# Patient Record
Sex: Female | Born: 1975
Health system: Southern US, Community
[De-identification: ages and names within clinical notes are randomized; demographics above are authoritative.]

## PROBLEM LIST (undated history)

## (undated) ENCOUNTER — Emergency Department (HOSPITAL_COMMUNITY): Admission: EM | Payer: BLUE CROSS/BLUE SHIELD | Source: Home / Self Care

## (undated) DIAGNOSIS — R7989 Other specified abnormal findings of blood chemistry: Secondary | ICD-10-CM

## (undated) DIAGNOSIS — F4000 Agoraphobia, unspecified: Secondary | ICD-10-CM

## (undated) DIAGNOSIS — G8929 Other chronic pain: Secondary | ICD-10-CM

## (undated) DIAGNOSIS — C4491 Basal cell carcinoma of skin, unspecified: Secondary | ICD-10-CM

## (undated) DIAGNOSIS — N83201 Unspecified ovarian cyst, right side: Secondary | ICD-10-CM

## (undated) DIAGNOSIS — E278 Other specified disorders of adrenal gland: Secondary | ICD-10-CM

## (undated) DIAGNOSIS — K219 Gastro-esophageal reflux disease without esophagitis: Secondary | ICD-10-CM

## (undated) DIAGNOSIS — G43909 Migraine, unspecified, not intractable, without status migrainosus: Secondary | ICD-10-CM

## (undated) DIAGNOSIS — F909 Attention-deficit hyperactivity disorder, unspecified type: Secondary | ICD-10-CM

## (undated) DIAGNOSIS — F609 Personality disorder, unspecified: Secondary | ICD-10-CM

## (undated) DIAGNOSIS — N809 Endometriosis, unspecified: Secondary | ICD-10-CM

## (undated) DIAGNOSIS — F319 Bipolar disorder, unspecified: Secondary | ICD-10-CM

## (undated) DIAGNOSIS — R87629 Unspecified abnormal cytological findings in specimens from vagina: Secondary | ICD-10-CM

## (undated) HISTORY — DX: Basal cell carcinoma of skin, unspecified: C44.91

---

## 2000-02-23 ENCOUNTER — Other Ambulatory Visit: Admission: RE | Admit: 2000-02-23 | Discharge: 2000-02-23 | Payer: Self-pay | Admitting: Gynecology

## 2000-03-26 ENCOUNTER — Other Ambulatory Visit: Admission: RE | Admit: 2000-03-26 | Discharge: 2000-03-26 | Payer: Self-pay | Admitting: Gynecology

## 2000-07-02 ENCOUNTER — Encounter: Payer: Self-pay | Admitting: Internal Medicine

## 2000-07-02 ENCOUNTER — Encounter: Admission: RE | Admit: 2000-07-02 | Discharge: 2000-07-02 | Payer: Self-pay | Admitting: Internal Medicine

## 2005-07-28 ENCOUNTER — Emergency Department (HOSPITAL_COMMUNITY): Admission: EM | Admit: 2005-07-28 | Discharge: 2005-07-28 | Payer: Self-pay | Admitting: Emergency Medicine

## 2006-04-24 ENCOUNTER — Emergency Department (HOSPITAL_COMMUNITY): Admission: EM | Admit: 2006-04-24 | Discharge: 2006-04-24 | Payer: Self-pay | Admitting: Emergency Medicine

## 2006-12-14 ENCOUNTER — Emergency Department (HOSPITAL_COMMUNITY): Admission: EM | Admit: 2006-12-14 | Discharge: 2006-12-14 | Payer: Self-pay | Admitting: Emergency Medicine

## 2006-12-16 ENCOUNTER — Emergency Department (HOSPITAL_COMMUNITY): Admission: EM | Admit: 2006-12-16 | Discharge: 2006-12-16 | Payer: Self-pay | Admitting: Emergency Medicine

## 2007-05-27 ENCOUNTER — Emergency Department (HOSPITAL_COMMUNITY): Admission: EM | Admit: 2007-05-27 | Discharge: 2007-05-27 | Payer: Self-pay | Admitting: Emergency Medicine

## 2008-05-22 ENCOUNTER — Ambulatory Visit: Payer: Self-pay | Admitting: *Deleted

## 2008-05-22 ENCOUNTER — Inpatient Hospital Stay (HOSPITAL_COMMUNITY): Admission: RE | Admit: 2008-05-22 | Discharge: 2008-05-24 | Payer: Self-pay | Admitting: *Deleted

## 2009-06-20 ENCOUNTER — Emergency Department (HOSPITAL_COMMUNITY): Admission: EM | Admit: 2009-06-20 | Discharge: 2009-06-20 | Payer: Self-pay | Admitting: Emergency Medicine

## 2010-01-19 HISTORY — PX: COLONOSCOPY: SHX174

## 2010-04-29 LAB — URINALYSIS, ROUTINE W REFLEX MICROSCOPIC
Bilirubin Urine: NEGATIVE
Glucose, UA: NEGATIVE mg/dL
Hgb urine dipstick: NEGATIVE
Protein, ur: NEGATIVE mg/dL
Urobilinogen, UA: 0.2 mg/dL (ref 0.0–1.0)

## 2010-04-29 LAB — BENZODIAZEPINE, QUANTITATIVE, URINE
Alprazolam (GC/LC/MS), ur confirm: NEGATIVE
Flurazepam GC/MS Conf: NEGATIVE

## 2010-04-29 LAB — DRUGS OF ABUSE SCREEN W/O ALC, ROUTINE URINE
Amphetamine Screen, Ur: NEGATIVE
Cocaine Metabolites: NEGATIVE
Marijuana Metabolite: NEGATIVE
Methadone: NEGATIVE
Opiate Screen, Urine: NEGATIVE

## 2010-04-29 LAB — CBC
HCT: 41.1 % (ref 36.0–46.0)
Hemoglobin: 14.4 g/dL (ref 12.0–15.0)
MCHC: 35.1 g/dL (ref 30.0–36.0)
MCV: 94 fL (ref 78.0–100.0)
Platelets: 453 10*3/uL — ABNORMAL HIGH (ref 150–400)
RBC: 4.37 MIL/uL (ref 3.87–5.11)
RDW: 12.9 % (ref 11.5–15.5)
WBC: 8.2 10*3/uL (ref 4.0–10.5)

## 2010-04-29 LAB — DIFFERENTIAL
Basophils Absolute: 0 10*3/uL (ref 0.0–0.1)
Basophils Relative: 1 % (ref 0–1)
Eosinophils Absolute: 0.1 10*3/uL (ref 0.0–0.7)
Eosinophils Relative: 1 % (ref 0–5)
Lymphocytes Relative: 39 % (ref 12–46)
Lymphs Abs: 3.2 10*3/uL (ref 0.7–4.0)
Monocytes Absolute: 0.5 10*3/uL (ref 0.1–1.0)
Monocytes Relative: 6 % (ref 3–12)
Neutro Abs: 4.4 10*3/uL (ref 1.7–7.7)
Neutrophils Relative %: 53 % (ref 43–77)

## 2010-04-29 LAB — COMPREHENSIVE METABOLIC PANEL
ALT: 35 U/L (ref 0–35)
AST: 24 U/L (ref 0–37)
Albumin: 4.2 g/dL (ref 3.5–5.2)
Alkaline Phosphatase: 62 U/L (ref 39–117)
BUN: 6 mg/dL (ref 6–23)
CO2: 27 mEq/L (ref 19–32)
Calcium: 9.7 mg/dL (ref 8.4–10.5)
Chloride: 105 mEq/L (ref 96–112)
Creatinine, Ser: 0.75 mg/dL (ref 0.4–1.2)
GFR calc Af Amer: >60 mL/min (ref >60)
GFR calc non Af Amer: >60 mL/min (ref >60)
Glucose, Bld: 90 mg/dL (ref 70–99)
Potassium: 4.4 mEq/L (ref 3.5–5.1)
Sodium: 137 mEq/L (ref 135–145)
Total Bilirubin: 0.4 mg/dL (ref 0.3–1.2)
Total Protein: 7.3 g/dL (ref 6.0–8.3)

## 2010-04-29 LAB — BARBITURATE, URINE, CONFIRMATION
Amobarbital UR Quant: NEGATIVE
Butabarbital UR Quant: NEGATIVE
Butalbital UR Quant: 3100 ng/mL
Pentobarbital GC/MS Conf: NEGATIVE
Phenobarbital GC/MS Conf: NEGATIVE
Secobarbital GC/MS Conf: NEGATIVE

## 2010-06-03 NOTE — Discharge Summary (Signed)
Laura Simmons, PROPPS NO.:  [id number]   MEDICAL RECORD NO.:  [id number]          PATIENT TYPE:  IPS   LOCATION:  0301                          FACILITY:  [facility]   PHYSICIAN:  Jasmine Pang, M.D. DATE OF BIRTH:  02/09/1975   DATE OF ADMISSION:  05/22/2008  DATE OF DISCHARGE:  05/24/2008                               DISCHARGE SUMMARY   IDENTIFICATION:  This is a 35 year old single white female from [facility].  Her parents live in [place] and they brought her to  the hospital on a voluntary basis.   HISTORY OF PRESENT ILLNESS:  The patient admits to being depressed and  suicidal after her boyfriend broke up with her for someone else.  She  states she is unable to stop crying.  She admits to daily panic attacks.  She has a history of cutting on herself and feels like cutting now.  For  further admission information, see psychiatric admission assessment.   PHYSICAL FINDINGS:  There were no acute physical or medical problems  noted.   HOSPITAL COURSE:  Upon admission, the patient was restarted on her home  medications of Cymbalta 60 mg p.o. q.a.m. and Seroquel 200 mg p.o.  q.h.s. daily.  She was also started on Ativan 1 mg p.o. q.6 h. p.r.n.  anxiety.  On May 23, 2008, her Cymbalta was increased to 90 mg daily.  She was placed on Ambien 10 mg p.o. q.h.s. p.r.n. sleep and Seroquel 50  mg p.o. q.4 h. p.r.n. anxiety.  Laboratories were obtained.  These  included a UDS, which was positive for barbiturates and for  benzodiazepines.  Comprehensive metabolic panel was within normal  limits.  Urinalysis was negative.  CBC with differential was positive  for an elevated platelet count of 453 (150-400).  The patient was also  complaining of a severe migraine headache.  She was started on Midrin 2  capsules now and 1 p.o. every 2 hours.  If headache unrelieved, not to  exceed 5 pills in a 24-hour period.  In individual sessions, the patient  admitted she has been  suicidal while driving.  She denied suicidal  ideation now.  There was no recent cutting, though she has had a history  of this in the past.  She was due to see a psychiatrist on Jun 01, 2008,  but has not seen one yet.  She had been placed on Cymbalta 60 mg and  Seroquel 200 mg p.o. q.h.s. by a former psychiatrist, but she states she  was not going to return to him due to they are not clicking.  The  Cymbalta was increased to 90 mg p.o. daily.  She was also started on  Seroquel 50 mg p.o. q.4 h. p.r.n. anxiety.  On May 24, 2008, the  patient's mood was less depressed and less anxious.  Affect was  consistent with mood.  There was no suicidal or homicidal ideation.  No  thoughts of self-injurious behavior.  No auditory or visual  hallucinations.  No paranoia or delusions.  Thoughts were logical and  goal-directed.  Thought content, no predominant  theme.  Cognitive was  grossly intact.  Insight good.  Judgment good.  Impulse control good.  The patient wanted to go home.  She is planning to stay with her parents  in [place] rather than returning to [place] at this time.  Her  mother was contacted and wanted very much for the patient to be  discharged to her care.  The patient stated she gets along well with her  parents and would feel much better being at home with them and it was  felt she was safe for discharge.   DISCHARGE DIAGNOSES:  Axis I:  Major depressive episode, recurrent,  severe.  Axis II:  None.  Axis III:  Migraine headaches.  Axis IV:  Severe (problems with primary support group, recent breakup  with boyfriend, other psychosocial problems, burden of psychiatric  illness, burden of severe migraine headaches).  Axis V:  Global assessment of functioning was 50 upon discharge.  Global  assessment of functioning was 35 upon admission.  Global assessment of  functioning highest past year was 65.   DISCHARGE PLAN:  There were no specific activity level or dietary   restrictions.   POSTHOSPITAL CARE PLANS:  The patient will see Dr. Blaine Hamper in  [place] on Jun 01, 2008, at 1 o'clock p.m. for further psychiatric  treatment.   DISCHARGE MEDICATIONS:  1. Seroquel 200 mg p.o. q.h.s.  2. Cymbalta 90 mg p.o. daily.  3. Ambien 10 mg at bedtime if needed for sleep.      Jasmine Pang, M.D.  Electronically Signed     [facility]/MEDQ  D:  05/24/2008  T:  05/25/2008  Job:  [id number]

## 2012-01-25 ENCOUNTER — Encounter (HOSPITAL_BASED_OUTPATIENT_CLINIC_OR_DEPARTMENT_OTHER): Payer: Self-pay | Admitting: *Deleted

## 2012-01-25 ENCOUNTER — Emergency Department (HOSPITAL_BASED_OUTPATIENT_CLINIC_OR_DEPARTMENT_OTHER)
Admission: EM | Admit: 2012-01-25 | Discharge: 2012-01-25 | Disposition: A | Discharge status: Home / Self Care | Payer: BC Managed Care – PPO | Attending: Emergency Medicine | Admitting: Emergency Medicine

## 2012-01-25 DIAGNOSIS — E039 Hypothyroidism, unspecified: Secondary | ICD-10-CM | POA: Insufficient documentation

## 2012-01-25 DIAGNOSIS — R51 Headache: Secondary | ICD-10-CM | POA: Insufficient documentation

## 2012-01-25 DIAGNOSIS — Z8679 Personal history of other diseases of the circulatory system: Secondary | ICD-10-CM | POA: Insufficient documentation

## 2012-01-25 DIAGNOSIS — Z79899 Other long term (current) drug therapy: Secondary | ICD-10-CM | POA: Insufficient documentation

## 2012-01-25 DIAGNOSIS — F17200 Nicotine dependence, unspecified, uncomplicated: Secondary | ICD-10-CM | POA: Insufficient documentation

## 2012-01-25 HISTORY — DX: Migraine, unspecified, not intractable, without status migrainosus: G43.909

## 2012-01-25 MED ORDER — HYDROMORPHONE HCL PF 1 MG/ML IJ SOLN
1.0000 mg | Freq: Once | INTRAMUSCULAR | Status: AC
  Administered 2012-01-25: 1 mg via INTRAVENOUS
  Filled 2012-01-25: qty 1

## 2012-01-25 MED ORDER — KETOROLAC TROMETHAMINE 30 MG/ML IJ SOLN
30.0000 mg | Freq: Once | INTRAMUSCULAR | Status: AC
  Administered 2012-01-25: 30 mg via INTRAVENOUS
  Filled 2012-01-25: qty 1

## 2012-01-25 MED ORDER — SODIUM CHLORIDE 0.9 % IV SOLN: INTRAVENOUS | Status: DC

## 2012-01-25 MED ORDER — PROMETHAZINE HCL 25 MG/ML IJ SOLN
25.0000 mg | Freq: Once | INTRAMUSCULAR | Status: AC
  Administered 2012-01-25: 25 mg via INTRAVENOUS
  Filled 2012-01-25: qty 1

## 2012-01-25 MED ORDER — BUTALBITAL-APAP-CAFF-COD 50-325-40-30 MG PO CAPS: 1.0000 | ORAL_CAPSULE | ORAL | Status: DC | PRN

## 2012-01-25 MED ORDER — SODIUM CHLORIDE 0.9 % IV BOLUS (SEPSIS)
1000.0000 mL | Freq: Once | INTRAVENOUS | Status: AC
  Administered 2012-01-25: 1000 mL via INTRAVENOUS

## 2012-01-25 MED ORDER — DIPHENHYDRAMINE HCL 50 MG/ML IJ SOLN
50.0000 mg | Freq: Once | INTRAMUSCULAR | Status: AC
  Administered 2012-01-25: 50 mg via INTRAVENOUS
  Filled 2012-01-25: qty 1

## 2012-01-25 NOTE — ED Notes (Signed)
Pt reports that she is in town visiting parents and did not plan on staying this long, reports that menstruation started this am and she has developed a severe migraine this am and is out of medicine

## 2012-01-25 NOTE — ED Notes (Signed)
Pt reports that she takes phenegran and toradol injections several times per month on a regular basis

## 2012-01-25 NOTE — ED Provider Notes (Signed)
History   This chart was scribed for Laura Horn, MD by Leone Payor, ED Scribe. This patient was seen in room MHOTF/OTF and the patient's care was started at 2129.   CSN: [id number]  Arrival date & time 01/25/12  1932   First MD Initiated Contact with Patient 01/25/12 2129      Chief Complaint  Patient presents with  . Headache    The history is provided by the patient. No language interpreter was used.   Laura Simmons is a 37 y.o. female who presents to the Emergency Department complaining of gradual onset, constant, unchanged, chronic headache described as throbbing all over with the last episode starting this morning. Pt has a h/o migraines that occur 2-3 times a month. Pt denies being pregnant. Pt also has associated nausea, vomiting, photophobia. Pt states that her PCP prescribes her Fioricet and Stadol which she uses with relief, but has run out of them recently. She denies fever, confusion, abdominal pain, vaginal discharge, weakness, numbness, incoordination, trauma, trouble with vision/speech/swallowing.   Pt has h/o migraines.  Pt is a current everyday smoker but denies alcohol use.  Past Medical History  Diagnosis Date  . Migraine   . Thyroid disease     History reviewed. No pertinent past surgical history.  No family history on file.  History  Substance Use Topics  . Smoking status: Current Every Day Smoker -- 1.0 packs/day    Types: Cigarettes  . Smokeless tobacco: Not on file  . Alcohol Use: No    No OB history provided.   Review of Systems 10 Systems reviewed and all are negative for acute change except as noted in the HPI.    Allergies  Compazine  Home Medications   Current Outpatient Rx  Name  Route  Sig  Dispense  Refill  . ALPRAZOLAM 1 MG PO TABS   Oral   Take 1 mg by mouth at bedtime as needed.         Marland Kitchen FIORICET PO   Oral   Take by mouth.         . BUTORPHANOL TARTRATE 10 MG/ML NA SOLN   Nasal   Place 1 spray into the nose  every 4 (four) hours as needed.         Marland Kitchen KETOROLAC TROMETHAMINE 15 MG/ML IJ SOLN   Intravenous   Inject 15 mg into the vein once as needed.         Marland Kitchen SYNTHROID PO   Oral   Take by mouth.         Marland Kitchen TIZANIDINE HCL PO   Oral   Take by mouth.         Marland Kitchen BUTALBITAL-APAP-CAFF-COD 50-325-40-30 MG PO CAPS   Oral   Take 1 capsule by mouth every 4 (four) hours as needed for headache.   6 capsule   0     BP 112/74  Pulse 88  Temp 97.8 F (36.6 C) (Oral)  Resp 18  SpO2 100%  Physical Exam  Nursing note and vitals reviewed. Constitutional:       Awake, alert, nontoxic appearance with baseline speech for patient.  HENT:  Head: Atraumatic.  Mouth/Throat: No oropharyngeal exudate.  Eyes: EOM are normal. Pupils are equal, round, and reactive to light. Right eye exhibits no discharge. Left eye exhibits no discharge.  Neck: Neck supple.  Cardiovascular: Normal rate and regular rhythm.   No murmur heard. Pulmonary/Chest: Effort normal and breath sounds normal. No stridor. No respiratory  distress. She has no wheezes. She has no rales. She exhibits no tenderness.  Abdominal: Soft. Bowel sounds are normal. She exhibits no mass. There is no tenderness. There is no rebound.  Musculoskeletal: She exhibits no tenderness.       Baseline ROM, moves extremities with no obvious new focal weakness.  Lymphadenopathy:    She has no cervical adenopathy.  Neurological:       Awake, alert, cooperative and aware of situation; motor strength bilaterally; sensation normal to light touch bilaterally; peripheral visual fields full to confrontation; no facial asymmetry; tongue midline; major cranial nerves appear intact; no pronator drift, normal finger to nose bilaterally, baseline gait without new ataxia.  Skin: No rash noted.  Psychiatric: She has a normal mood and affect.    ED Course  Procedures (including critical care time)  DIAGNOSTIC STUDIES: Oxygen Saturation is 100% on room air,  normal by my interpretation.    COORDINATION OF CARE:  9:37PM Patient / Family / Caregiver understand and agree with initial ED impression and plan with expectations set for ED visit.   Labs Reviewed - No data to display No results found.   1. Headache       MDM  I personally performed the services described in this documentation, which was scribed in my presence. The recorded information has been reviewed and is accurate.  Patient / Family / Caregiver informed of clinical course, understand medical decision-making process, and agree with plan.  I doubt any other EMC precluding discharge at this time including, but not necessarily limited to the following: SAH, CVA, SBI.  Laura Horn, MD 01/27/12 [phone]

## 2012-01-25 NOTE — ED Notes (Signed)
Migraine headache since this am.  

## 2012-09-07 ENCOUNTER — Emergency Department (HOSPITAL_BASED_OUTPATIENT_CLINIC_OR_DEPARTMENT_OTHER)
Admission: EM | Admit: 2012-09-07 | Discharge: 2012-09-07 | Disposition: A | Discharge status: Home / Self Care | Payer: BC Managed Care – PPO | Attending: Emergency Medicine | Admitting: Emergency Medicine

## 2012-09-07 ENCOUNTER — Encounter (HOSPITAL_BASED_OUTPATIENT_CLINIC_OR_DEPARTMENT_OTHER): Payer: Self-pay | Admitting: Emergency Medicine

## 2012-09-07 DIAGNOSIS — Z79899 Other long term (current) drug therapy: Secondary | ICD-10-CM | POA: Insufficient documentation

## 2012-09-07 DIAGNOSIS — F17200 Nicotine dependence, unspecified, uncomplicated: Secondary | ICD-10-CM | POA: Insufficient documentation

## 2012-09-07 DIAGNOSIS — G43909 Migraine, unspecified, not intractable, without status migrainosus: Secondary | ICD-10-CM | POA: Insufficient documentation

## 2012-09-07 DIAGNOSIS — R112 Nausea with vomiting, unspecified: Secondary | ICD-10-CM | POA: Insufficient documentation

## 2012-09-07 DIAGNOSIS — E079 Disorder of thyroid, unspecified: Secondary | ICD-10-CM | POA: Insufficient documentation

## 2012-09-07 MED ORDER — KETOROLAC TROMETHAMINE 30 MG/ML IJ SOLN
30.0000 mg | Freq: Once | INTRAMUSCULAR | Status: AC
  Administered 2012-09-07: 30 mg via INTRAVENOUS
  Filled 2012-09-07: qty 1

## 2012-09-07 MED ORDER — DEXAMETHASONE SODIUM PHOSPHATE 10 MG/ML IJ SOLN
10.0000 mg | Freq: Once | INTRAMUSCULAR | Status: AC
  Administered 2012-09-07: 10 mg via INTRAVENOUS
  Filled 2012-09-07: qty 1

## 2012-09-07 MED ORDER — HYDROMORPHONE HCL PF 1 MG/ML IJ SOLN
1.0000 mg | Freq: Once | INTRAMUSCULAR | Status: AC
  Administered 2012-09-07: 1 mg via INTRAVENOUS
  Filled 2012-09-07: qty 1

## 2012-09-07 MED ORDER — SODIUM CHLORIDE 0.9 % IV SOLN
Freq: Once | INTRAVENOUS | Status: AC
  Administered 2012-09-07: 18:00:00 via INTRAVENOUS

## 2012-09-07 MED ORDER — PROMETHAZINE HCL 25 MG/ML IJ SOLN
25.0000 mg | Freq: Once | INTRAMUSCULAR | Status: AC
  Administered 2012-09-07: 25 mg via INTRAVENOUS
  Filled 2012-09-07: qty 1

## 2012-09-07 NOTE — ED Notes (Signed)
Pt walked in to ED. Came via [facility].  Pt states migraine started at 11:00 this morning dull pain that has progressively gotten worse.  Nausea and light sensitivity.  Originally diagnosed with migraines at age 37 years.  Pt answers all questions clearly.  Mother is in the room with pt.  Pt is sensitive to the sounds in the room.  Pt pain is a 9 out of 10.  Pt states pain is in the anterior of head.

## 2012-09-07 NOTE — ED Provider Notes (Signed)
CSN: [id number]     Arrival date & time 09/07/12  1639 History     First MD Initiated Contact with Patient 09/07/12 1706     Chief Complaint  Patient presents with  . Migraine  . Nausea   (Consider location/radiation/quality/duration/timing/severity/associated sxs/prior Treatment) Patient is a 37 y.o. female presenting with migraines. The history is provided by the patient. No language interpreter was used.  Migraine This is a recurrent problem. The current episode started today. The problem occurs constantly. The problem has been gradually improving. Associated symptoms include nausea and vomiting. Nothing aggravates the symptoms. She has tried nothing for the symptoms. The treatment provided no relief.  Pt has a history of migranes.   Pt's Md is in [place].  Pt complains of a headache since 1100am today.  No relief with usual migraine medications.    Past Medical History  Diagnosis Date  . Migraine   . Thyroid disease    History reviewed. No pertinent past surgical history. No family history on file. History  Substance Use Topics  . Smoking status: Current Every Day Smoker -- 1.00 packs/day    Types: Cigarettes  . Smokeless tobacco: Not on file  . Alcohol Use: No   OB History   Grav Para Term Preterm Abortions TAB SAB Ect Mult Living                 Review of Systems  Gastrointestinal: Positive for nausea and vomiting.  All other systems reviewed and are negative.    Allergies  Compazine and Triptans  Home Medications   Current Outpatient Rx  Name  Route  Sig  Dispense  Refill  . ALPRAZolam (XANAX) 1 MG tablet   Oral   Take 1 mg by mouth at bedtime as needed.         . butalbital-acetaminophen-caffeine (FIORICET WITH CODEINE) 50-325-40-30 MG per capsule   Oral   Take 1 capsule by mouth every 4 (four) hours as needed for headache.   6 capsule   0   . Butalbital-APAP-Caffeine (FIORICET PO)   Oral   Take by mouth.         . butorphanol (STADOL) 10  MG/ML nasal spray   Nasal   Place 1 spray into the nose every 4 (four) hours as needed.         Marland Kitchen ketorolac (TORADOL) 15 MG/ML injection   Intravenous   Inject 15 mg into the vein once as needed.         . Levothyroxine Sodium (SYNTHROID PO)   Oral   Take by mouth.         Marland Kitchen TIZANIDINE HCL PO   Oral   Take by mouth.          BP 134/86  Pulse 73  Temp(Src) 98.6 F (37 C) (Oral)  Resp 16  Ht 5\' 4"  (1.626 m)  Wt 140 lb (63.504 kg)  BMI 24.02 kg/m2  SpO2 100%  LMP 08/24/2012 Physical Exam  Nursing note and vitals reviewed. Constitutional: She is oriented to person, place, and time. She appears well-developed and well-nourished.  HENT:  Head: Normocephalic.  Right Ear: External ear normal.  Left Ear: External ear normal.  Nose: Nose normal.  Mouth/Throat: Oropharynx is clear and moist.  Eyes: Conjunctivae and EOM are normal. Pupils are equal, round, and reactive to light.  Neck: Normal range of motion. Neck supple.  Cardiovascular: Normal rate and normal heart sounds.   Pulmonary/Chest: Effort normal and breath sounds normal.  Abdominal: Soft.  Musculoskeletal: Normal range of motion.  Neurological: She is alert and oriented to person, place, and time. She has normal reflexes.  Skin: Skin is warm.  Psychiatric: She has a normal mood and affect.    ED Course   Procedures (including critical care time)  Labs Reviewed - No data to display No results found. 1. Headache     MDM  Pt given Iv fluids x 1 liter, torodol, phenergan, decadron and dilaudid.   Pt reports headache feels better  Elson Areas, PA-C 09/07/12 2004

## 2012-09-08 NOTE — ED Provider Notes (Signed)
History/physical exam/procedure(s) were performed by non-physician practitioner and as supervising physician I was immediately available for consultation/collaboration. I have reviewed all notes and am in agreement with care and plan.   Hilario Quarry, MD 09/08/12 1444

## 2013-01-06 ENCOUNTER — Ambulatory Visit (INDEPENDENT_AMBULATORY_CARE_PROVIDER_SITE_OTHER): Payer: BC Managed Care – PPO | Admitting: Physician Assistant

## 2013-01-06 VITALS — BP 132/82 | HR 84 | Temp 98.6°F | Resp 17 | Ht 65.0 in | Wt 145.0 lb

## 2013-01-06 DIAGNOSIS — R110 Nausea: Secondary | ICD-10-CM

## 2013-01-06 DIAGNOSIS — G43829 Menstrual migraine, not intractable, without status migrainosus: Secondary | ICD-10-CM

## 2013-01-06 DIAGNOSIS — G43909 Migraine, unspecified, not intractable, without status migrainosus: Secondary | ICD-10-CM

## 2013-01-06 MED ORDER — PROMETHAZINE HCL 25 MG/ML IJ SOLN
25.0000 mg | Freq: Once | INTRAMUSCULAR | Status: AC
  Administered 2013-01-06: 25 mg via INTRAMUSCULAR

## 2013-01-06 MED ORDER — KETOROLAC TROMETHAMINE 60 MG/2ML IM SOLN
60.0000 mg | Freq: Once | INTRAMUSCULAR | Status: AC
  Administered 2013-01-06: 60 mg via INTRAMUSCULAR

## 2013-01-06 NOTE — Progress Notes (Signed)
   Subjective:    Patient ID: Laura Simmons, female    DOB: 1975/09/03, 37 y.o.   MRN: [id number]  HPI Pt presents to clinic with a migraine that started today about 12pm.  She took her Fioricet but it did not seem to help.  She does not typically resort to use of her Stadol.  This is a typical migraine for her,  She starts her menses yesterday.  She has photophobia and nausea without extremity weakness.  Menstrual related and tension related migraines Works with a neurologist at home - she is here visiting her parents - when her migraines do not go away with her Fioricet she goes to her PCP and gets Nubain, Toradol, phenergan and dexamethasone injections and this stops her migraines. Review of Systems  Eyes: Positive for photophobia.  Gastrointestinal: Positive for nausea.  Neurological: Positive for headaches.       Objective:   Physical Exam  Vitals reviewed. Constitutional: She is oriented to person, place, and time. She appears well-developed and well-nourished.  Laying in a dark room.  HENT:  Head: Normocephalic and atraumatic.  Right Ear: External ear normal.  Left Ear: External ear normal.  Eyes: Conjunctivae and EOM are normal. Pupils are equal, round, and reactive to light.  Neck: Normal range of motion.  Cardiovascular: Normal rate, regular rhythm and normal heart sounds.   No murmur heard. Pulmonary/Chest: Effort normal.  Musculoskeletal: Normal range of motion.  Neurological: She is alert and oriented to person, place, and time. She has normal strength and normal reflexes. No cranial nerve deficit or sensory deficit.  Skin: Skin is warm and dry.  Psychiatric: She has a normal mood and affect. Her behavior is normal. Judgment and thought content normal.       Assessment & Plan:  Migraine - Plan: ketorolac (TORADOL) injection 60 mg, promethazine (PHENERGAN) injection 25 mg  Nausea alone  Will give her Toradol and phenergan (her mom picked her up).  Benny Lennert PA-C 01/06/2013 4:32 PM

## 2013-01-07 ENCOUNTER — Emergency Department (HOSPITAL_BASED_OUTPATIENT_CLINIC_OR_DEPARTMENT_OTHER)
Admission: EM | Admit: 2013-01-07 | Discharge: 2013-01-07 | Disposition: A | Discharge status: Home / Self Care | Payer: BC Managed Care – PPO | Attending: Emergency Medicine | Admitting: Emergency Medicine

## 2013-01-07 ENCOUNTER — Encounter (HOSPITAL_BASED_OUTPATIENT_CLINIC_OR_DEPARTMENT_OTHER): Payer: Self-pay | Admitting: Emergency Medicine

## 2013-01-07 DIAGNOSIS — Z862 Personal history of diseases of the blood and blood-forming organs and certain disorders involving the immune mechanism: Secondary | ICD-10-CM | POA: Insufficient documentation

## 2013-01-07 DIAGNOSIS — G43909 Migraine, unspecified, not intractable, without status migrainosus: Secondary | ICD-10-CM | POA: Insufficient documentation

## 2013-01-07 DIAGNOSIS — Z8639 Personal history of other endocrine, nutritional and metabolic disease: Secondary | ICD-10-CM | POA: Insufficient documentation

## 2013-01-07 DIAGNOSIS — F17200 Nicotine dependence, unspecified, uncomplicated: Secondary | ICD-10-CM | POA: Insufficient documentation

## 2013-01-07 MED ORDER — METOCLOPRAMIDE HCL 5 MG/ML IJ SOLN
10.0000 mg | Freq: Once | INTRAMUSCULAR | Status: AC
  Administered 2013-01-07: 10 mg via INTRAVENOUS
  Filled 2013-01-07: qty 2

## 2013-01-07 MED ORDER — HYDROMORPHONE HCL PF 1 MG/ML IJ SOLN
1.0000 mg | Freq: Once | INTRAMUSCULAR | Status: AC
  Administered 2013-01-07: 1 mg via INTRAVENOUS
  Filled 2013-01-07: qty 1

## 2013-01-07 MED ORDER — KETOROLAC TROMETHAMINE 30 MG/ML IJ SOLN
30.0000 mg | Freq: Once | INTRAMUSCULAR | Status: AC
  Administered 2013-01-07: 30 mg via INTRAVENOUS
  Filled 2013-01-07: qty 1

## 2013-01-07 MED ORDER — DEXAMETHASONE SODIUM PHOSPHATE 10 MG/ML IJ SOLN
10.0000 mg | Freq: Once | INTRAMUSCULAR | Status: AC
  Administered 2013-01-07: 10 mg via INTRAVENOUS
  Filled 2013-01-07: qty 1

## 2013-01-07 MED ORDER — SODIUM CHLORIDE 0.9 % IV BOLUS (SEPSIS)
1000.0000 mL | Freq: Once | INTRAVENOUS | Status: AC
  Administered 2013-01-07: 1000 mL via INTRAVENOUS

## 2013-01-07 MED ORDER — DIPHENHYDRAMINE HCL 50 MG/ML IJ SOLN
25.0000 mg | Freq: Once | INTRAMUSCULAR | Status: AC
  Administered 2013-01-07: 25 mg via INTRAVENOUS
  Filled 2013-01-07: qty 1

## 2013-01-07 NOTE — ED Provider Notes (Signed)
Medical screening examination/treatment/procedure(s) were performed by non-physician practitioner and as supervising physician I was immediately available for consultation/collaboration.  EKG Interpretation   None         William Ambrose Wile, MD 01/07/13 2348 

## 2013-01-07 NOTE — ED Notes (Signed)
P t c/o migraine that began yesterday at around noon. Pt was seen at [facility] and given toradol and phenergan injections with little relief. Pt sts they did not give her a steroid like she usually receives.

## 2013-01-07 NOTE — ED Provider Notes (Signed)
CSN: [id number]     Arrival date & time 01/07/13  2002 History   First MD Initiated Contact with Patient 01/07/13 2155     Chief Complaint  Patient presents with  . Migraine   (Consider location/radiation/quality/duration/timing/severity/associated sxs/prior Treatment) HPI Comments: Patient is a 37 year old female with a past medical history of chronic migraines associated with her menstrual cycle who presents with a headache for 3 days. Patient reports a gradual onset and progressive worsening of the headache. The pain is aching, constant and is located in generalized head without radiation. Patient has tried OTC medicatio for symptoms without relief. Patient was also seen at Urgent care and given IM toradol and phenergan without relief. No alleviating/aggravating factors. Patient reports associated nausea and photophobia. Patient denies fever, vomiting, diarrhea, numbness/tingling, weakness, visual changes, congestion, chest pain, SOB, abdominal pain.     Patient is a 37 y.o. female presenting with migraines.  Migraine Associated symptoms include headaches. Pertinent negatives include no abdominal pain, arthralgias, chest pain, chills, fatigue, fever, nausea, neck pain, vomiting or weakness.    Past Medical History  Diagnosis Date  . Migraine   . Thyroid disease    History reviewed. No pertinent past surgical history. No family history on file. History  Substance Use Topics  . Smoking status: Current Every Day Smoker -- 1.00 packs/day for 20 years    Types: Cigarettes  . Smokeless tobacco: Not on file  . Alcohol Use: No   OB History   Grav Para Term Preterm Abortions TAB SAB Ect Mult Living                 Review of Systems  Constitutional: Negative for fever, chills and fatigue.  HENT: Negative for trouble swallowing.   Eyes: Negative for visual disturbance.  Respiratory: Negative for shortness of breath.   Cardiovascular: Negative for chest pain and palpitations.   Gastrointestinal: Negative for nausea, vomiting, abdominal pain and diarrhea.  Genitourinary: Negative for dysuria and difficulty urinating.  Musculoskeletal: Negative for arthralgias and neck pain.  Skin: Negative for color change.  Neurological: Positive for headaches. Negative for dizziness and weakness.  Psychiatric/Behavioral: Negative for dysphoric mood.    Allergies  Compazine and Triptans  Home Medications   Current Outpatient Rx  Name  Route  Sig  Dispense  Refill  . ALPRAZolam (XANAX) 1 MG tablet   Oral   Take 1 mg by mouth at bedtime as needed.         . butalbital-acetaminophen-caffeine (FIORICET WITH CODEINE) 50-325-40-30 MG per capsule   Oral   Take 1 capsule by mouth every 4 (four) hours as needed for headache.   6 capsule   0   . butorphanol (STADOL) 10 MG/ML nasal spray   Nasal   Place 1 spray into the nose every 4 (four) hours as needed.         Marland Kitchen ketorolac (TORADOL) 15 MG/ML injection   Intravenous   Inject 15 mg into the vein once as needed.         Marland Kitchen TIZANIDINE HCL PO   Oral   Take by mouth.         . verapamil (COVERA HS) 240 MG (CO) 24 hr tablet   Oral   Take 240 mg by mouth at bedtime.          BP 123/84  Pulse 94  Temp(Src) 98.3 F (36.8 C) (Oral)  Resp 16  Ht 5\' 4"  (1.626 m)  Wt 145 lb (65.772 kg)  BMI 24.88 kg/m2  SpO2 100%  LMP 01/05/2013 Physical Exam  Nursing note and vitals reviewed. Constitutional: She is oriented to person, place, and time. She appears well-developed and well-nourished. No distress.  HENT:  Head: Normocephalic and atraumatic.  Eyes: Conjunctivae and EOM are normal.  Neck: Normal range of motion.  Cardiovascular: Normal rate and regular rhythm.  Exam reveals no gallop and no friction rub.   No murmur heard. Pulmonary/Chest: Effort normal and breath sounds normal. She has no wheezes. She has no rales. She exhibits no tenderness.  Abdominal: Soft. She exhibits no distension. There is no  tenderness. There is no rebound and no guarding.  Musculoskeletal: Normal range of motion.  Neurological: She is alert and oriented to person, place, and time. Coordination normal.  No meningeal signs. Speech is goal-oriented. Moves limbs without ataxia.   Skin: Skin is warm and dry.  Psychiatric: She has a normal mood and affect. Her behavior is normal.    ED Course  Procedures (including critical care time) Labs Review Labs Reviewed - No data to display Imaging Review No results found.  EKG Interpretation   None       MDM   1. Migraine     11:25 PM Patient is feeling much better after migraine cocktail of fluids, toradol, reglan, dilaudid and benadryl. Vitals stable and patient afebrile. Patient reports this felt like one of her typical migraine headaches associated with her menstrual cycle. Patient will be discharged without further evaluation.     Emilia Beck, PA-C 01/07/13 2332

## 2013-01-19 ENCOUNTER — Emergency Department (HOSPITAL_BASED_OUTPATIENT_CLINIC_OR_DEPARTMENT_OTHER)
Admission: EM | Admit: 2013-01-19 | Discharge: 2013-01-19 | Disposition: A | Discharge status: Home / Self Care | Payer: BC Managed Care – PPO | Attending: Emergency Medicine | Admitting: Emergency Medicine

## 2013-01-19 ENCOUNTER — Emergency Department (HOSPITAL_BASED_OUTPATIENT_CLINIC_OR_DEPARTMENT_OTHER): Payer: BC Managed Care – PPO

## 2013-01-19 DIAGNOSIS — Z862 Personal history of diseases of the blood and blood-forming organs and certain disorders involving the immune mechanism: Secondary | ICD-10-CM | POA: Insufficient documentation

## 2013-01-19 DIAGNOSIS — N390 Urinary tract infection, site not specified: Secondary | ICD-10-CM | POA: Insufficient documentation

## 2013-01-19 DIAGNOSIS — Z79899 Other long term (current) drug therapy: Secondary | ICD-10-CM | POA: Insufficient documentation

## 2013-01-19 DIAGNOSIS — F17200 Nicotine dependence, unspecified, uncomplicated: Secondary | ICD-10-CM | POA: Insufficient documentation

## 2013-01-19 DIAGNOSIS — Z8639 Personal history of other endocrine, nutritional and metabolic disease: Secondary | ICD-10-CM | POA: Insufficient documentation

## 2013-01-19 DIAGNOSIS — R1110 Vomiting, unspecified: Secondary | ICD-10-CM | POA: Insufficient documentation

## 2013-01-19 DIAGNOSIS — G43909 Migraine, unspecified, not intractable, without status migrainosus: Secondary | ICD-10-CM | POA: Insufficient documentation

## 2013-01-19 DIAGNOSIS — Z3202 Encounter for pregnancy test, result negative: Secondary | ICD-10-CM | POA: Insufficient documentation

## 2013-01-19 LAB — URINALYSIS, ROUTINE W REFLEX MICROSCOPIC
Glucose, UA: NEGATIVE mg/dL
Hgb urine dipstick: NEGATIVE
Ketones, ur: NEGATIVE mg/dL
NITRITE: POSITIVE — AB
Protein, ur: NEGATIVE mg/dL
SPECIFIC GRAVITY, URINE: 1.014 (ref 1.005–1.030)
UROBILINOGEN UA: 1 mg/dL (ref 0.0–1.0)
pH: 6 (ref 5.0–8.0)

## 2013-01-19 LAB — URINE MICROSCOPIC-ADD ON

## 2013-01-19 LAB — PREGNANCY, URINE: Preg Test, Ur: NEGATIVE

## 2013-01-19 MED ORDER — CEPHALEXIN 500 MG PO CAPS: 500.0000 mg | ORAL_CAPSULE | Freq: Four times a day (QID) | ORAL | Status: DC

## 2013-01-19 MED ORDER — PANTOPRAZOLE SODIUM 20 MG PO TBEC: 20.0000 mg | DELAYED_RELEASE_TABLET | Freq: Every day | ORAL | Status: DC

## 2013-01-19 MED ORDER — ONDANSETRON 4 MG PO TBDP
4.0000 mg | ORAL_TABLET | Freq: Once | ORAL | Status: AC
  Administered 2013-01-19: 4 mg via ORAL
  Filled 2013-01-19: qty 1

## 2013-01-19 MED ORDER — PHENAZOPYRIDINE HCL 200 MG PO TABS: 200.0000 mg | ORAL_TABLET | Freq: Three times a day (TID) | ORAL | Status: DC

## 2013-01-19 MED ORDER — GI COCKTAIL ~~LOC~~
30.0000 mL | Freq: Once | ORAL | Status: AC
  Administered 2013-01-19: 30 mL via ORAL
  Filled 2013-01-19: qty 30

## 2013-01-19 MED ORDER — HYDROCODONE-ACETAMINOPHEN 5-325 MG PO TABS
2.0000 | ORAL_TABLET | Freq: Once | ORAL | Status: AC
Start: 2013-01-19 — End: 2013-01-19
  Administered 2013-01-19: 2 via ORAL
  Filled 2013-01-19: qty 2

## 2013-01-19 MED ORDER — HYDROCODONE-ACETAMINOPHEN 5-325 MG PO TABS: 2.0000 | ORAL_TABLET | ORAL | Status: DC | PRN

## 2013-01-19 NOTE — ED Provider Notes (Signed)
CSN: [id number]     Arrival date & time 01/19/13  1352 History   First MD Initiated Contact with Patient 01/19/13 1532     Chief Complaint  Patient presents with  . Chest Pain   (Consider location/radiation/quality/duration/timing/severity/associated sxs/prior Treatment) Patient is a 38 y.o. female presenting with frequency. The history is provided by the patient. No language interpreter was used.  Urinary Frequency This is a new problem. The current episode started 1 to 4 weeks ago. The problem occurs constantly. The problem has been gradually worsening. Associated symptoms include vomiting. Pertinent negatives include no abdominal pain. Nothing aggravates the symptoms. She has tried nothing for the symptoms.  Pt also complains of epigastric discomfort after eating beef and onions cooked in sesame oil.    Past Medical History  Diagnosis Date  . Migraine   . Thyroid disease    No past surgical history on file. No family history on file. History  Substance Use Topics  . Smoking status: Current Every Day Smoker -- 1.00 packs/day for 20 years    Types: Cigarettes  . Smokeless tobacco: Not on file  . Alcohol Use: No   OB History   Grav Para Term Preterm Abortions TAB SAB Ect Mult Living                 Review of Systems  Gastrointestinal: Positive for vomiting. Negative for abdominal pain.  Genitourinary: Positive for frequency.  All other systems reviewed and are negative.    Allergies  Compazine and Triptans  Home Medications   Current Outpatient Rx  Name  Route  Sig  Dispense  Refill  . ALPRAZolam (XANAX) 1 MG tablet   Oral   Take 1 mg by mouth at bedtime as needed.         . butalbital-acetaminophen-caffeine (FIORICET WITH CODEINE) 50-325-40-30 MG per capsule   Oral   Take 1 capsule by mouth every 4 (four) hours as needed for headache.   6 capsule   0   . butorphanol (STADOL) 10 MG/ML nasal spray   Nasal   Place 1 spray into the nose every 4 (four) hours as  needed.         Marland Kitchen ketorolac (TORADOL) 15 MG/ML injection   Intravenous   Inject 15 mg into the vein once as needed.         Marland Kitchen TIZANIDINE HCL PO   Oral   Take by mouth.         . verapamil (COVERA HS) 240 MG (CO) 24 hr tablet   Oral   Take 240 mg by mouth at bedtime.          BP 122/85  Pulse 75  Temp(Src) 98.7 F (37.1 C) (Oral)  Resp 18  Ht 5\' 4"  (1.626 m)  Wt 145 lb (65.772 kg)  BMI 24.88 kg/m2  SpO2 97%  LMP 01/05/2013 Physical Exam  Nursing note and vitals reviewed. Constitutional: She is oriented to person, place, and time. She appears well-developed and well-nourished.  HENT:  Head: Normocephalic.  Eyes: Conjunctivae and EOM are normal. Pupils are equal, round, and reactive to light.  Neck: Normal range of motion.  Cardiovascular: Normal rate, regular rhythm and normal heart sounds.   Pulmonary/Chest: Effort normal.  Abdominal: She exhibits no distension.  Musculoskeletal: Normal range of motion.  Neurological: She is alert and oriented to person, place, and time.  Skin: Skin is warm.  Psychiatric: She has a normal mood and affect.    ED Course  Procedures (including critical care time) Labs Review Labs Reviewed  URINALYSIS, ROUTINE W REFLEX MICROSCOPIC - Abnormal; Notable for the following:    Color, Urine ORANGE (*)    APPearance CLOUDY (*)    Bilirubin Urine SMALL (*)    Nitrite POSITIVE (*)    Leukocytes, UA SMALL (*)    All other components within normal limits  URINE MICROSCOPIC-ADD ON - Abnormal; Notable for the following:    Squamous Epithelial / LPF MANY (*)    Bacteria, UA MANY (*)    All other components within normal limits  URINE CULTURE  PREGNANCY, URINE   Imaging Review Dg Chest 2 View  01/19/2013   CLINICAL DATA:  Chest pain.  EXAM: CHEST  2 VIEW  COMPARISON:  None.  FINDINGS: The cardiac silhouette, mediastinal and hilar contours are within normal limits. The lungs are clear. No pleural effusion. The bony thorax is intact.   IMPRESSION: No acute cardiopulmonary findings.   Electronically Signed   By: Kalman Jewels M.D.   On: 01/19/2013 14:49    EKG Interpretation   None      No relief with gi cocktail,  Pt counseled on treatment MDM   1. UTI (lower urinary tract infection)    Hydrocodone, keflex, pyridium and protonix    Fransico Meadow, PA-C 01/19/13 1844

## 2013-01-19 NOTE — ED Notes (Addendum)
Pt sts constant pain in R sternal/rib joint area, which worsens after eating. Upon palpation, pain is reproduced over R rib. Vomiting today. Denies shob but sts it hurts to take a deep breath. Pt also reports pain in lower abd and lower back, sts she has been taking AZO for suspected UTI. Also sts light brown diarrhea x1 yesterday and loose light brown stool today.

## 2013-01-19 NOTE — ED Provider Notes (Addendum)
Medical screening examination/treatment/procedure(s) were performed by non-physician practitioner and as supervising physician I was immediately available for consultation/collaboration.  EKG Interpretation    Date/Time:  Thursday January 19 2013 14:14:11 EST Ventricular Rate:  81 PR Interval:  142 QRS Duration: 70 QT Interval:  380 QTC Calculation: 441 R Axis:   75 Text Interpretation:  Normal sinus rhythm Normal ECG No old tracing to compare Confirmed by Chanay Nugent  MD, Laurene Melendrez ([id number]) on 01/19/2013 8:56:20 PM              Malvin Johns, MD 01/19/13 [place], MD 01/19/13 2056

## 2013-01-20 LAB — URINE CULTURE

## 2013-07-11 ENCOUNTER — Encounter (HOSPITAL_BASED_OUTPATIENT_CLINIC_OR_DEPARTMENT_OTHER): Payer: Self-pay | Admitting: Emergency Medicine

## 2013-07-11 ENCOUNTER — Emergency Department (HOSPITAL_BASED_OUTPATIENT_CLINIC_OR_DEPARTMENT_OTHER)
Admission: EM | Admit: 2013-07-11 | Discharge: 2013-07-11 | Disposition: A | Discharge status: Home / Self Care | Payer: BC Managed Care – PPO | Attending: Emergency Medicine | Admitting: Emergency Medicine

## 2013-07-11 DIAGNOSIS — R519 Headache, unspecified: Secondary | ICD-10-CM

## 2013-07-11 DIAGNOSIS — Z79899 Other long term (current) drug therapy: Secondary | ICD-10-CM | POA: Insufficient documentation

## 2013-07-11 DIAGNOSIS — Z792 Long term (current) use of antibiotics: Secondary | ICD-10-CM | POA: Insufficient documentation

## 2013-07-11 DIAGNOSIS — G43909 Migraine, unspecified, not intractable, without status migrainosus: Secondary | ICD-10-CM | POA: Insufficient documentation

## 2013-07-11 DIAGNOSIS — Z862 Personal history of diseases of the blood and blood-forming organs and certain disorders involving the immune mechanism: Secondary | ICD-10-CM | POA: Insufficient documentation

## 2013-07-11 DIAGNOSIS — R51 Headache: Secondary | ICD-10-CM

## 2013-07-11 DIAGNOSIS — Z8639 Personal history of other endocrine, nutritional and metabolic disease: Secondary | ICD-10-CM | POA: Insufficient documentation

## 2013-07-11 DIAGNOSIS — F17200 Nicotine dependence, unspecified, uncomplicated: Secondary | ICD-10-CM | POA: Insufficient documentation

## 2013-07-11 MED ORDER — SODIUM CHLORIDE 0.9 % IV BOLUS (SEPSIS)
1000.0000 mL | Freq: Once | INTRAVENOUS | Status: AC
  Administered 2013-07-11: 1000 mL via INTRAVENOUS

## 2013-07-11 MED ORDER — METOCLOPRAMIDE HCL 5 MG/ML IJ SOLN
10.0000 mg | Freq: Once | INTRAMUSCULAR | Status: AC
  Administered 2013-07-11: 10 mg via INTRAVENOUS

## 2013-07-11 MED ORDER — DIPHENHYDRAMINE HCL 50 MG/ML IJ SOLN
25.0000 mg | Freq: Once | INTRAMUSCULAR | Status: AC
  Administered 2013-07-11: 25 mg via INTRAVENOUS

## 2013-07-11 MED ORDER — BUTORPHANOL TARTRATE 10 MG/ML NA SOLN: 1.0000 | NASAL | Status: DC | PRN

## 2013-07-11 MED ORDER — HYDROMORPHONE HCL PF 1 MG/ML IJ SOLN
0.5000 mg | Freq: Once | INTRAMUSCULAR | Status: AC
Start: 2013-07-11 — End: 2013-07-11
  Administered 2013-07-11: 0.5 mg via INTRAVENOUS
  Filled 2013-07-11: qty 1

## 2013-07-11 MED ORDER — KETOROLAC TROMETHAMINE 30 MG/ML IJ SOLN
30.0000 mg | Freq: Once | INTRAMUSCULAR | Status: AC
  Administered 2013-07-11: 30 mg via INTRAVENOUS

## 2013-07-11 MED ORDER — DEXAMETHASONE SODIUM PHOSPHATE 10 MG/ML IJ SOLN
10.0000 mg | Freq: Once | INTRAMUSCULAR | Status: AC
  Administered 2013-07-11: 10 mg via INTRAVENOUS

## 2013-07-11 NOTE — ED Notes (Signed)
Pt. Reports she is from [facility] and did not pack her stadol before coming to visit her mother.  Pt. Reports she was awakened by the headache. Pt. Reports pain and pressure on R side and in the neck.

## 2013-07-11 NOTE — ED Provider Notes (Signed)
CSN: [id number]     Arrival date & time 07/11/13  1704 History   First MD Initiated Contact with Patient 07/11/13 1713     Chief Complaint  Patient presents with  . Migraine      HPI  Patient presents with concerns of headache.  This headache awakened her from sleep approximately 12 hours ago. Since onset and is been frontal, throbbing, severe. There is some radiation towards the left neck. This is typical for her recurrent headache exacerbations. Patient has a long history of migraines, denies new characteristics today. Patient does not have her medication with her. No new confusion, disorientation, though the patient is nauseous No new visual changes, asymmetric weakness, syncope.  P .ast Medical History  Diagnosis Date  . Migraine   . Thyroid disease    History reviewed. No pertinent past surgical history. No family history on file. History  Substance Use Topics  . Smoking status: Current Every Day Smoker -- 1.00 packs/day for 20 years    Types: Cigarettes  . Smokeless tobacco: Not on file  . Alcohol Use: No   OB History   Grav Para Term Preterm Abortions TAB SAB Ect Mult Living                 Review of Systems  Constitutional:       Per HPI, otherwise negative  HENT:       Per HPI, otherwise negative  Respiratory:       Per HPI, otherwise negative  Cardiovascular:       Per HPI, otherwise negative  Gastrointestinal: Negative for vomiting.  Endocrine:       Negative aside from HPI  Genitourinary:       Neg aside from HPI   Musculoskeletal:       Per HPI, otherwise negative  Skin: Negative.   Neurological: Positive for headaches. Negative for syncope.      Allergies  Compazine and Triptans  Home Medications   Prior to Admission medications   Medication Sig Start Date End Date Taking? Authorizing Provider  ALPRAZolam Duanne Moron) 1 MG tablet Take 1 mg by mouth at bedtime as needed.    Historical Provider, MD  butalbital-acetaminophen-caffeine  (FIORICET WITH CODEINE) 50-325-40-30 MG per capsule Take 1 capsule by mouth every 4 (four) hours as needed for headache. 01/25/12   Babette Relic, MD  butorphanol (STADOL) 10 MG/ML nasal spray Place 1 spray into the nose every 4 (four) hours as needed.    Historical Provider, MD  cephALEXin (KEFLEX) 500 MG capsule Take 1 capsule (500 mg total) by mouth 4 (four) times daily. 01/19/13   Fransico Meadow, PA-C  HYDROcodone-acetaminophen (NORCO/VICODIN) 5-325 MG per tablet Take 2 tablets by mouth every 4 (four) hours as needed. 01/19/13   Fransico Meadow, PA-C  ketorolac (TORADOL) 15 MG/ML injection Inject 15 mg into the vein once as needed.    Historical Provider, MD  pantoprazole (PROTONIX) 20 MG tablet Take 1 tablet (20 mg total) by mouth daily. 01/19/13   Fransico Meadow, PA-C  phenazopyridine (PYRIDIUM) 200 MG tablet Take 1 tablet (200 mg total) by mouth 3 (three) times daily. 01/19/13   Fransico Meadow, PA-C  TIZANIDINE HCL PO Take by mouth.    Historical Provider, MD  verapamil (COVERA HS) 240 MG (CO) 24 hr tablet Take 240 mg by mouth at bedtime.    Historical Provider, MD   LMP 06/26/2013 Physical Exam  Nursing note and vitals reviewed. Constitutional: She is oriented  to person, place, and time. She appears well-developed and well-nourished. No distress.  HENT:  Head: Normocephalic and atraumatic.  Eyes: Conjunctivae and EOM are normal.  Cardiovascular: Normal rate and regular rhythm.   Pulmonary/Chest: Effort normal and breath sounds normal. No stridor. No respiratory distress.  Abdominal: She exhibits no distension.  Musculoskeletal: She exhibits no edema.  Neurological: She is alert and oriented to person, place, and time. No cranial nerve deficit. She exhibits normal muscle tone. Coordination normal.  Skin: Skin is warm and dry.  Psychiatric: She has a normal mood and affect.    ED Course  Procedures (including critical care time) After the evaluation I reviewed the patient's chart.  10:03  PM Pain substantially improved, no new complaints, no evidence of distress.  MDM  Patient with a long history of migraines presents with typical headache.  Patient has no new neurologic complaints, deficits on exam, and after resolution of her headache, she was discharged in stable condition.   Carmin Muskrat, MD 07/11/13 2203

## 2013-07-11 NOTE — Discharge Instructions (Signed)
As discussed, your evaluation today has been largely reassuring.  But, it is important that you monitor your condition carefully, and do not hesitate to return to the ED if you develop new, or concerning changes in your condition. ? ?Otherwise, please follow-up with your physician for appropriate ongoing care. ? ?

## 2013-09-18 ENCOUNTER — Emergency Department (HOSPITAL_BASED_OUTPATIENT_CLINIC_OR_DEPARTMENT_OTHER)
Admission: EM | Admit: 2013-09-18 | Discharge: 2013-09-18 | Disposition: A | Discharge status: Home / Self Care | Payer: BC Managed Care – PPO | Attending: Emergency Medicine | Admitting: Emergency Medicine

## 2013-09-18 ENCOUNTER — Encounter (HOSPITAL_BASED_OUTPATIENT_CLINIC_OR_DEPARTMENT_OTHER): Payer: Self-pay | Admitting: Emergency Medicine

## 2013-09-18 DIAGNOSIS — R110 Nausea: Secondary | ICD-10-CM | POA: Diagnosis not present

## 2013-09-18 DIAGNOSIS — Z79899 Other long term (current) drug therapy: Secondary | ICD-10-CM | POA: Insufficient documentation

## 2013-09-18 DIAGNOSIS — Z792 Long term (current) use of antibiotics: Secondary | ICD-10-CM | POA: Diagnosis not present

## 2013-09-18 DIAGNOSIS — G43909 Migraine, unspecified, not intractable, without status migrainosus: Secondary | ICD-10-CM | POA: Diagnosis present

## 2013-09-18 DIAGNOSIS — F17200 Nicotine dependence, unspecified, uncomplicated: Secondary | ICD-10-CM | POA: Insufficient documentation

## 2013-09-18 DIAGNOSIS — G43001 Migraine without aura, not intractable, with status migrainosus: Secondary | ICD-10-CM | POA: Insufficient documentation

## 2013-09-18 DIAGNOSIS — H53149 Visual discomfort, unspecified: Secondary | ICD-10-CM | POA: Diagnosis not present

## 2013-09-18 MED ORDER — KETAMINE HCL 10 MG/ML IJ SOLN
1.0000 mg/kg | Freq: Once | INTRAMUSCULAR | Status: AC
  Administered 2013-09-18: 66 mg via INTRAVENOUS
  Filled 2013-09-18: qty 1

## 2013-09-18 MED ORDER — DEXAMETHASONE SODIUM PHOSPHATE 4 MG/ML IJ SOLN
INTRAMUSCULAR | Status: AC
  Filled 2013-09-18: qty 1

## 2013-09-18 MED ORDER — DEXAMETHASONE SODIUM PHOSPHATE 20 MG/5ML IJ SOLN
INTRAMUSCULAR | Status: AC
  Administered 2013-09-18: 10 mg
  Filled 2013-09-18: qty 5

## 2013-09-18 MED ORDER — KETOROLAC TROMETHAMINE 60 MG/2ML IM SOLN
60.0000 mg | Freq: Once | INTRAMUSCULAR | Status: AC
  Administered 2013-09-18: 60 mg via INTRAMUSCULAR
  Filled 2013-09-18: qty 2

## 2013-09-18 MED ORDER — DIPHENHYDRAMINE HCL 25 MG PO CAPS
25.0000 mg | ORAL_CAPSULE | Freq: Once | ORAL | Status: AC
  Administered 2013-09-18: 25 mg via ORAL
  Filled 2013-09-18: qty 1

## 2013-09-18 MED ORDER — SODIUM CHLORIDE 0.9 % IV BOLUS (SEPSIS)
1000.0000 mL | Freq: Once | INTRAVENOUS | Status: AC
  Administered 2013-09-18: 500 mL via INTRAVENOUS

## 2013-09-18 MED ORDER — METOCLOPRAMIDE HCL 5 MG/ML IJ SOLN
10.0000 mg | Freq: Once | INTRAMUSCULAR | Status: AC
  Administered 2013-09-18: 10 mg via INTRAMUSCULAR
  Filled 2013-09-18: qty 2

## 2013-09-18 MED ORDER — DEXAMETHASONE SODIUM PHOSPHATE 10 MG/ML IJ SOLN
10.0000 mg | Freq: Once | INTRAMUSCULAR | Status: AC
  Administered 2013-09-18: 10 mg via INTRAMUSCULAR

## 2013-09-18 NOTE — ED Provider Notes (Signed)
CSN: [id number]     Arrival date & time 09/18/13  1323 History   First MD Initiated Contact with Patient 09/18/13 1343     Chief Complaint  Patient presents with  . Migraine     (Consider location/radiation/quality/duration/timing/severity/associated sxs/prior Treatment) HPI Comments: Patient is a 38 year old female with a past medical history of migraines and thyroid disease who presents to the emergency department complaining of a migraine headache x3 days. Patient reports she tends to get migraine headaches around the onset of her menses, she reports she had a headache one week ago that lasted about 3 days. Her headache has subsided and returned gradually. Headache located at the top of her head radiating throughout, described as throbbing. Admits to photophobia, phonophobia and nausea. No vomiting. She has tried taking Imitrex, benadryl and ibuprofen with minimal relief. States this feels the same as her typical migraines. Denies fever chills. No neck stiffness.  Patient is a 38 y.o. female presenting with migraines. The history is provided by the patient.  Migraine Associated symptoms include headaches and nausea.    Past Medical History  Diagnosis Date  . Migraine   . Thyroid disease    History reviewed. No pertinent past surgical history. No family history on file. History  Substance Use Topics  . Smoking status: Current Every Day Smoker -- 1.00 packs/day for 20 years    Types: Cigarettes  . Smokeless tobacco: Not on file  . Alcohol Use: No   OB History   Grav Para Term Preterm Abortions TAB SAB Ect Mult Living                 Review of Systems  Eyes: Positive for photophobia.  Gastrointestinal: Positive for nausea.  Neurological: Positive for headaches.  All other systems reviewed and are negative.     Allergies  Compazine and Triptans  Home Medications   Prior to Admission medications   Medication Sig Start Date End Date Taking? Authorizing Provider   ALPRAZolam Duanne Moron) 1 MG tablet Take 1 mg by mouth at bedtime as needed.    Historical Provider, MD  butalbital-acetaminophen-caffeine (FIORICET WITH CODEINE) 50-325-40-30 MG per capsule Take 1 capsule by mouth every 4 (four) hours as needed for headache. 01/25/12   Babette Relic, MD  butorphanol (STADOL) 10 MG/ML nasal spray Place 1 spray into the nose every 4 (four) hours as needed. 07/11/13   Carmin Muskrat, MD  cephALEXin (KEFLEX) 500 MG capsule Take 1 capsule (500 mg total) by mouth 4 (four) times daily. 01/19/13   Fransico Meadow, PA-C  HYDROcodone-acetaminophen (NORCO/VICODIN) 5-325 MG per tablet Take 2 tablets by mouth every 4 (four) hours as needed. 01/19/13   Fransico Meadow, PA-C  ketorolac (TORADOL) 15 MG/ML injection Inject 15 mg into the vein once as needed.    Historical Provider, MD  pantoprazole (PROTONIX) 20 MG tablet Take 1 tablet (20 mg total) by mouth daily. 01/19/13   Fransico Meadow, PA-C  phenazopyridine (PYRIDIUM) 200 MG tablet Take 1 tablet (200 mg total) by mouth 3 (three) times daily. 01/19/13   Fransico Meadow, PA-C  TIZANIDINE HCL PO Take by mouth.    Historical Provider, MD  verapamil (COVERA HS) 240 MG (CO) 24 hr tablet Take 240 mg by mouth at bedtime.    Historical Provider, MD   BP 141/92  Pulse 86  Temp(Src) 98 F (36.7 C) (Oral)  Resp 20  Wt 145 lb (65.772 kg)  SpO2 100%  LMP 09/17/2013 Physical Exam  Nursing  note and vitals reviewed. Constitutional: She is oriented to person, place, and time. She appears well-developed and well-nourished. No distress.  Sitting on exam bed with lights off and sunglasses on.  HENT:  Head: Normocephalic and atraumatic.  Mouth/Throat: Oropharynx is clear and moist.  Eyes: Conjunctivae and EOM are normal. Pupils are equal, round, and reactive to light.  Neck: Normal range of motion. Neck supple.  No meningeal signs.  Cardiovascular: Normal rate, regular rhythm, normal heart sounds and intact distal pulses.   Pulmonary/Chest: Effort  normal and breath sounds normal. No respiratory distress.  Abdominal: Soft. Bowel sounds are normal. There is no tenderness.  Musculoskeletal: Normal range of motion. She exhibits no edema.  Neurological: She is alert and oriented to person, place, and time. She has normal strength. No cranial nerve deficit or sensory deficit. Coordination and gait normal.  Speech fluent, goal oriented. Moves limbs without ataxia. Equal grip strength bilateral.  Skin: Skin is warm and dry. No rash noted. She is not diaphoretic.  Psychiatric: She has a normal mood and affect. Her behavior is normal.    ED Course  Procedures (including critical care time) Labs Review Labs Reviewed - No data to display  Imaging Review No results found.   EKG Interpretation None      MDM   Final diagnoses:  Migraine without aura and with status migrainosus, not intractable   Patient is having a migraine headache, the same as her typical migraines. She is nontoxic appearing and in no apparent distress. Afebrile, vital signs stable. No focal neurologic deficits. No meningeal signs. Doubt intracranial hemorrhage, subarachnoid hemorrhage or meningitis. Plan to give migraine cocktail and reassess. 3:23 PM Pt reports no improvement with reglan, benadryl, toradol and decadron. She is requesting dilaudid or Nubain. I discussed with her that these are not appropriate treatments for a migraine headache. Will give pt ketamine per Dr. Darl Householder who will push the Ketamine.  Pt signed out to Charlann Lange, PA-C at shift change. Plan to d/c patient after she gets Ketamine.  Illene Labrador, PA-C 09/18/13 1550

## 2013-09-18 NOTE — ED Provider Notes (Signed)
Medical screening examination/treatment/procedure(s) were conducted as a shared visit with non-physician practitioner(s) and myself.  I personally evaluated the patient during the encounter.   EKG Interpretation None      See my separate note   Wandra Arthurs, MD 09/18/13 [phone]

## 2013-09-18 NOTE — Discharge Instructions (Signed)
Migraine Headache A migraine headache is an intense, throbbing pain on one or both sides of your head. A migraine can last for 30 minutes to several hours. CAUSES  The exact cause of a migraine headache is not always known. However, a migraine may be caused when nerves in the brain become irritated and release chemicals that cause inflammation. This causes pain. Certain things may also trigger migraines, such as:  Alcohol.  Smoking.  Stress.  Menstruation.  Aged cheeses.  Foods or drinks that contain nitrates, glutamate, aspartame, or tyramine.  Lack of sleep.  Chocolate.  Caffeine.  Hunger.  Physical exertion.  Fatigue.  Medicines used to treat chest pain (nitroglycerine), birth control pills, estrogen, and some blood pressure medicines. SIGNS AND SYMPTOMS  Pain on one or both sides of your head.  Pulsating or throbbing pain.  Severe pain that prevents daily activities.  Pain that is aggravated by any physical activity.  Nausea, vomiting, or both.  Dizziness.  Pain with exposure to bright lights, loud noises, or activity.  General sensitivity to bright lights, loud noises, or smells. Before you get a migraine, you may get warning signs that a migraine is coming (aura). An aura may include:  Seeing flashing lights.  Seeing bright spots, halos, or zigzag lines.  Having tunnel vision or blurred vision.  Having feelings of numbness or tingling.  Having trouble talking.  Having muscle weakness. DIAGNOSIS  A migraine headache is often diagnosed based on:  Symptoms.  Physical exam.  A CT scan or MRI of your head. These imaging tests cannot diagnose migraines, but they can help rule out other causes of headaches. TREATMENT Medicines may be given for pain and nausea. Medicines can also be given to help prevent recurrent migraines.  HOME CARE INSTRUCTIONS  Only take over-the-counter or prescription medicines for pain or discomfort as directed by your  health care provider. The use of long-term narcotics is not recommended.  Lie down in a dark, quiet room when you have a migraine.  Keep a journal to find out what may trigger your migraine headaches. For example, write down:  What you eat and drink.  How much sleep you get.  Any change to your diet or medicines.  Limit alcohol consumption.  Quit smoking if you smoke.  Get 7-9 hours of sleep, or as recommended by your health care provider.  Limit stress.  Keep lights dim if bright lights bother you and make your migraines worse. SEEK IMMEDIATE MEDICAL CARE IF:   Your migraine becomes severe.  You have a fever.  You have a stiff neck.  You have vision loss.  You have muscular weakness or loss of muscle control.  You start losing your balance or have trouble walking.  You feel faint or pass out.  You have severe symptoms that are different from your first symptoms. MAKE SURE YOU:   Understand these instructions.  Will watch your condition.  Will get help right away if you are not doing well or get worse. Document Released: 01/05/2005 Document Revised: 05/22/2013 Document Reviewed: 09/12/2012 [facility] Patient Information 2015 [place], [place]. This information is not intended to replace advice given to you by your health care provider. Make sure you discuss any questions you have with your health care provider.  Recurrent Migraine Headache A migraine headache is an intense, throbbing pain on one or both sides of your head. Recurrent migraines keep coming back. A migraine can last for 30 minutes to several hours. CAUSES  The exact cause  of a migraine headache is not always known. However, a migraine may be caused when nerves in the brain become irritated and release chemicals that cause inflammation. This causes pain. Certain things may also trigger migraines, such as:   Alcohol.  Smoking.  Stress.  Menstruation.  Aged cheeses.  Foods or drinks that contain  nitrates, glutamate, aspartame, or tyramine.  Lack of sleep.  Chocolate.  Caffeine.  Hunger.  Physical exertion.  Fatigue.  Medicines used to treat chest pain (nitroglycerine), birth control pills, estrogen, and some blood pressure medicines. SYMPTOMS   Pain on one or both sides of your head.  Pulsating or throbbing pain.  Severe pain that prevents daily activities.  Pain that is aggravated by any physical activity.  Nausea, vomiting, or both.  Dizziness.  Pain with exposure to bright lights, loud noises, or activity.  General sensitivity to bright lights, loud noises, or smells. Before you get a migraine, you may get warning signs that a migraine is coming (aura). An aura may include:  Seeing flashing lights.  Seeing bright spots, halos, or zigzag lines.  Having tunnel vision or blurred vision.  Having feelings of numbness or tingling.  Having trouble talking.  Having muscle weakness. DIAGNOSIS  A recurrent migraine headache is often diagnosed based on:  Symptoms.  Physical examination.  A CT scan or MRI of your head. These imaging tests cannot diagnose migraines but can help rule out other causes of headaches.  TREATMENT  Medicines may be given for pain and nausea. Medicines can also be given to help prevent recurrent migraines. HOME CARE INSTRUCTIONS  Only take over-the-counter or prescription medicines for pain or discomfort as directed by your health care provider. The use of long-term narcotics is not recommended.  Lie down in a dark, quiet room when you have a migraine.  Keep a journal to find out what may trigger your migraine headaches. For example, write down:  What you eat and drink.  How much sleep you get.  Any change to your diet or medicines.  Limit alcohol consumption.  Quit smoking if you smoke.  Get 7-9 hours of sleep, or as recommended by your health care provider.  Limit stress.  Keep lights dim if bright lights bother  you and make your migraines worse. SEEK MEDICAL CARE IF:   You do not get relief from the medicines given to you.  You have a recurrence of pain.  You have a fever. SEEK IMMEDIATE MEDICAL CARE IF:  Your migraine becomes severe.  You have a stiff neck.  You have loss of vision.  You have muscular weakness or loss of muscle control.  You start losing your balance or have trouble walking.  You feel faint or pass out.  You have severe symptoms that are different from your first symptoms. MAKE SURE YOU:   Understand these instructions.  Will watch your condition.  Will get help right away if you are not doing well or get worse. Document Released: 09/30/2000 Document Revised: 05/22/2013 Document Reviewed: 09/12/2012 [facility] Patient Information 2015 [place], [place]. This information is not intended to replace advice given to you by your health care provider. Make sure you discuss any questions you have with your health care provider.

## 2013-09-18 NOTE — ED Provider Notes (Signed)
Medical screening examination/treatment/procedure(s) were conducted as a shared visit with non-physician practitioner(s) and myself.  I personally evaluated the patient during the encounter.   EKG Interpretation None      Laura Simmons is a 38 y.o. female hx of migraines here with headache for 3 days. Typical migraine and she started on her menses several days ago, which usually gets her headache worse. Tried imitrex, motrin with minimal relief. On exam, photophobic. Neuro exam unremarkable otherwise. Given migraine cocktail with minimal relief. She states that she usually get dilaudid or nubain. I told her that we don't give narcotics for headaches. Given ketamine. Signed out to oncoming team to reassess patient.    Wandra Arthurs, MD 09/18/13 1901

## 2013-09-18 NOTE — ED Notes (Signed)
Pt. Up to rest room walking steady gait with no sunglasses on.

## 2013-09-18 NOTE — ED Notes (Signed)
Migraine for 3 days. Light sensitive. Nausea.

## 2013-09-18 NOTE — ED Provider Notes (Signed)
Patient care transferred from [facility], PA-C, pending re-evaluation of headache after ketamine administration. Multiple visits for migraine headaches, well established history. Headache cocktail without relief. She is usually given Dilaudid IV but feel non-narcotic therapy supported by current literature. Will re-assess but plan discharge on re-evaluation. Refer to neurology if headache persists.   On reassessment, she reports unfavorable side effects of ketamine but she is feeling better from those effects now. Headache eased off. She is currently being referred to [facility] for further management of migraine headaches but has neurology follow up in place for current headache. Stable for discharge.   Dewaine Oats, PA-C 09/18/13 1822

## 2014-07-13 ENCOUNTER — Encounter (HOSPITAL_BASED_OUTPATIENT_CLINIC_OR_DEPARTMENT_OTHER): Payer: Self-pay

## 2014-07-13 ENCOUNTER — Emergency Department (HOSPITAL_BASED_OUTPATIENT_CLINIC_OR_DEPARTMENT_OTHER)
Admission: EM | Admit: 2014-07-13 | Discharge: 2014-07-13 | Disposition: A | Discharge status: Home / Self Care | Payer: BLUE CROSS/BLUE SHIELD | Attending: Emergency Medicine | Admitting: Emergency Medicine

## 2014-07-13 DIAGNOSIS — Z8639 Personal history of other endocrine, nutritional and metabolic disease: Secondary | ICD-10-CM | POA: Diagnosis not present

## 2014-07-13 DIAGNOSIS — R51 Headache: Secondary | ICD-10-CM

## 2014-07-13 DIAGNOSIS — Z720 Tobacco use: Secondary | ICD-10-CM | POA: Diagnosis not present

## 2014-07-13 DIAGNOSIS — R519 Headache, unspecified: Secondary | ICD-10-CM

## 2014-07-13 DIAGNOSIS — G43909 Migraine, unspecified, not intractable, without status migrainosus: Secondary | ICD-10-CM | POA: Insufficient documentation

## 2014-07-13 MED ORDER — KETOROLAC TROMETHAMINE 30 MG/ML IJ SOLN
30.0000 mg | Freq: Once | INTRAMUSCULAR | Status: AC
  Administered 2014-07-13: 30 mg via INTRAMUSCULAR
  Filled 2014-07-13: qty 1

## 2014-07-13 MED ORDER — DIPHENHYDRAMINE HCL 50 MG/ML IJ SOLN
25.0000 mg | Freq: Once | INTRAMUSCULAR | Status: AC
  Administered 2014-07-13: 25 mg via INTRAMUSCULAR
  Filled 2014-07-13: qty 1

## 2014-07-13 MED ORDER — HYDROMORPHONE HCL 1 MG/ML IJ SOLN
1.0000 mg | Freq: Once | INTRAMUSCULAR | Status: AC
  Administered 2014-07-13: 1 mg via INTRAMUSCULAR
  Filled 2014-07-13: qty 1

## 2014-07-13 MED ORDER — PROMETHAZINE HCL 25 MG/ML IJ SOLN
25.0000 mg | Freq: Once | INTRAMUSCULAR | Status: AC
Start: 2014-07-13 — End: 2014-07-13
  Administered 2014-07-13: 25 mg via INTRAMUSCULAR
  Filled 2014-07-13: qty 1

## 2014-07-13 MED ORDER — DEXAMETHASONE SODIUM PHOSPHATE 10 MG/ML IJ SOLN
10.0000 mg | Freq: Once | INTRAMUSCULAR | Status: AC
  Administered 2014-07-13: 10 mg via INTRAMUSCULAR
  Filled 2014-07-13: qty 1

## 2014-07-13 NOTE — Discharge Instructions (Signed)

## 2014-07-13 NOTE — ED Notes (Signed)
Family at bedside. 

## 2014-07-13 NOTE — ED Notes (Signed)
Migraine x 3 days-here visiting from [place]-states she normally get , toradol, phenergan, nubain, dexamethasone with relief

## 2014-07-13 NOTE — ED Notes (Signed)
Pt. Reports she has eaten some today but no interest in eating due to nausea.  Pt. Had a little of a grilled cheese sandwich per Pt.

## 2014-07-13 NOTE — ED Notes (Signed)
Pt. Usually gets  IM injections at her Dr. In [place].  Meds include Toradol, Phenergan, Nubane, and Dexamethazone.     Pt. Was seen here before and received Ketamine with a negative reaction with panic attack.

## 2014-07-13 NOTE — ED Provider Notes (Signed)
CSN: [id number]     Arrival date & time 07/13/14  1621 History  This chart was scribed for Quintella Reichert, MD by Chester Holstein, ED Scribe. This patient was seen in room MH08/MH08 and the patient's care was started at 6:51 PM.      Chief Complaint  Patient presents with  . Migraine      The history is provided by the patient. No language interpreter was used.   HPI Comments: Laura Simmons is a 39 y.o. female who presents to the Emergency Department complaining of constant headache with onset 3 days ago, worsening 2 days ago. Pt notes associated photophobia and vomiting 4 times with onset today. Pt took Adrenamine and Fioricet with no relief. Pt also has Stadol at home. Pt typically gets IM injections Toradol, Phenergan, Nubain, and dexamethasone for relief. Pt reports h/o migraines as PMS. Pt states her LNMP just ended. Pt denies ear pain, abdominal pain, fever, numbness, and weakness. Sxs are moderate and constant.    Past Medical History  Diagnosis Date  . Migraine   . Thyroid disease    History reviewed. No pertinent past surgical history. No family history on file. History  Substance Use Topics  . Smoking status: Current Every Day Smoker -- 1.00 packs/day for 20 years    Types: Cigarettes  . Smokeless tobacco: Not on file  . Alcohol Use: No   OB History    No data available     Review of Systems  Constitutional: Negative for fever.  HENT: Negative for ear pain.   Eyes: Positive for photophobia.  Gastrointestinal: Positive for vomiting. Negative for abdominal pain.  Neurological: Positive for headaches. Negative for weakness and numbness.  All other systems reviewed and are negative.     Allergies  Compazine; Ketamine; Reglan; and Triptans  Home Medications   Prior to Admission medications   Medication Sig Start Date End Date Taking? Authorizing Provider  ALPRAZolam Duanne Moron) 1 MG tablet Take 1 mg by mouth at bedtime as needed.    Historical Provider, MD   butalbital-acetaminophen-caffeine (FIORICET WITH CODEINE) 50-325-40-30 MG per capsule Take 1 capsule by mouth every 4 (four) hours as needed for headache. 01/25/12   Riki Altes, MD  butorphanol (STADOL) 10 MG/ML nasal spray Place 1 spray into the nose every 4 (four) hours as needed. 07/11/13   Carmin Muskrat, MD   BP 117/66 mmHg  Pulse 56  Temp(Src) 98.3 F (36.8 C) (Oral)  Resp 16  Ht 5\' 5"  (1.651 m)  Wt 130 lb (58.968 kg)  BMI 21.63 kg/m2  SpO2 99%  LMP 07/08/2014 Physical Exam  Constitutional: She is oriented to person, place, and time. She appears well-developed and well-nourished.  HENT:  Head: Normocephalic and atraumatic.  Eyes: EOM are normal. Pupils are equal, round, and reactive to light.  Cardiovascular: Normal rate and regular rhythm.   No murmur heard. Pulmonary/Chest: Effort normal and breath sounds normal. No respiratory distress.  Abdominal: Soft. There is no tenderness. There is no rebound and no guarding.  Musculoskeletal: She exhibits no edema or tenderness.  Neurological: She is alert and oriented to person, place, and time. No cranial nerve deficit.  Skin: Skin is warm and dry.  Psychiatric: She has a normal mood and affect. Her behavior is normal.  Nursing note and vitals reviewed.   ED Course  Procedures (including critical care time) DIAGNOSTIC STUDIES: Oxygen Saturation is 99% on room air, normal by my interpretation.    COORDINATION OF CARE: 6:56 PM Discussed  treatment plan with patient at beside, the patient agrees with the plan and has no further questions at this time.   Labs Review Labs Reviewed - No data to display  Imaging Review No results found.   EKG Interpretation None      MDM   Final diagnoses:  Bad headache   Patient with history of migraine headache here with recurrent headache. History and presentation is not consistent with meningitis, subarachnoid hemorrhage, dural thrombosis. Patient requesting Dilaudid injection since  we do not have Nubain available. Initially tried treatment without narcotic medication, but patient had no improvement in her headache. Discussed with patient that narcotic medications are not the treatment of choice for migraines, pt expresses understanding but would like trial of dilaudid.  Treating with one-time dose of the live with outpatient follow-up.  I personally performed the services described in this documentation, which was scribed in my presence. The recorded information has been reviewed and is accurate.     Quintella Reichert, MD 07/14/14 [phone]

## 2014-12-16 ENCOUNTER — Emergency Department (HOSPITAL_BASED_OUTPATIENT_CLINIC_OR_DEPARTMENT_OTHER)
Admission: EM | Admit: 2014-12-16 | Discharge: 2014-12-16 | Disposition: A | Discharge status: Home / Self Care | Payer: BLUE CROSS/BLUE SHIELD | Attending: Emergency Medicine | Admitting: Emergency Medicine

## 2014-12-16 ENCOUNTER — Encounter (HOSPITAL_BASED_OUTPATIENT_CLINIC_OR_DEPARTMENT_OTHER): Payer: Self-pay | Admitting: *Deleted

## 2014-12-16 DIAGNOSIS — R51 Headache: Secondary | ICD-10-CM | POA: Diagnosis not present

## 2014-12-16 DIAGNOSIS — R5383 Other fatigue: Secondary | ICD-10-CM | POA: Insufficient documentation

## 2014-12-16 DIAGNOSIS — Z8639 Personal history of other endocrine, nutritional and metabolic disease: Secondary | ICD-10-CM | POA: Insufficient documentation

## 2014-12-16 DIAGNOSIS — M791 Myalgia, unspecified site: Secondary | ICD-10-CM

## 2014-12-16 DIAGNOSIS — R509 Fever, unspecified: Secondary | ICD-10-CM | POA: Diagnosis present

## 2014-12-16 DIAGNOSIS — Z8679 Personal history of other diseases of the circulatory system: Secondary | ICD-10-CM | POA: Diagnosis not present

## 2014-12-16 DIAGNOSIS — M2550 Pain in unspecified joint: Secondary | ICD-10-CM | POA: Insufficient documentation

## 2014-12-16 DIAGNOSIS — D72829 Elevated white blood cell count, unspecified: Secondary | ICD-10-CM | POA: Insufficient documentation

## 2014-12-16 LAB — CBC WITH DIFFERENTIAL/PLATELET
Basophils Absolute: 0 10*3/uL (ref 0.0–0.1)
Basophils Relative: 0 %
EOS ABS: 0.2 10*3/uL (ref 0.0–0.7)
Eosinophils Relative: 2 %
HEMATOCRIT: 38 % (ref 36.0–46.0)
HEMOGLOBIN: 13.1 g/dL (ref 12.0–15.0)
LYMPHS ABS: 4 10*3/uL (ref 0.7–4.0)
Lymphocytes Relative: 35 %
MCH: 31.8 pg (ref 26.0–34.0)
MCHC: 34.5 g/dL (ref 30.0–36.0)
MCV: 92.2 fL (ref 78.0–100.0)
MONOS PCT: 9 %
Monocytes Absolute: 1.1 10*3/uL — ABNORMAL HIGH (ref 0.1–1.0)
NEUTROS ABS: 6.3 10*3/uL (ref 1.7–7.7)
Neutrophils Relative %: 54 %
Platelets: 401 10*3/uL — ABNORMAL HIGH (ref 150–400)
RBC: 4.12 MIL/uL (ref 3.87–5.11)
RDW: 12.7 % (ref 11.5–15.5)
WBC: 11.6 10*3/uL — ABNORMAL HIGH (ref 4.0–10.5)

## 2014-12-16 LAB — COMPREHENSIVE METABOLIC PANEL
ALBUMIN: 3.6 g/dL (ref 3.5–5.0)
ALK PHOS: 43 U/L (ref 38–126)
ALT: 13 U/L — ABNORMAL LOW (ref 14–54)
ANION GAP: 7 (ref 5–15)
AST: 14 U/L — ABNORMAL LOW (ref 15–41)
BILIRUBIN TOTAL: 0.2 mg/dL — AB (ref 0.3–1.2)
BUN: 11 mg/dL (ref 6–20)
CALCIUM: 8.7 mg/dL — AB (ref 8.9–10.3)
CO2: 24 mmol/L (ref 22–32)
Chloride: 108 mmol/L (ref 101–111)
Creatinine, Ser: 0.65 mg/dL (ref 0.44–1.00)
GFR calc non Af Amer: >60 mL/min (ref >60)
Glucose, Bld: 74 mg/dL (ref 65–99)
Potassium: 3.5 mmol/L (ref 3.5–5.1)
Sodium: 139 mmol/L (ref 135–145)
TOTAL PROTEIN: 6.4 g/dL — AB (ref 6.5–8.1)

## 2014-12-16 LAB — URINALYSIS, ROUTINE W REFLEX MICROSCOPIC
Bilirubin Urine: NEGATIVE
GLUCOSE, UA: NEGATIVE mg/dL
HGB URINE DIPSTICK: NEGATIVE
KETONES UR: NEGATIVE mg/dL
Leukocytes, UA: NEGATIVE
Nitrite: NEGATIVE
Protein, ur: NEGATIVE mg/dL
SPECIFIC GRAVITY, URINE: 1.019 (ref 1.005–1.030)
pH: 6 (ref 5.0–8.0)

## 2014-12-16 LAB — SEDIMENTATION RATE: SED RATE: 1 mm/h (ref 0–22)

## 2014-12-16 LAB — CK: CK TOTAL: 23 U/L — AB (ref 38–234)

## 2014-12-16 MED ORDER — ONDANSETRON HCL 4 MG/2ML IJ SOLN
4.0000 mg | Freq: Once | INTRAMUSCULAR | Status: AC
  Administered 2014-12-16: 4 mg via INTRAVENOUS
  Filled 2014-12-16: qty 2

## 2014-12-16 MED ORDER — KETOROLAC TROMETHAMINE 30 MG/ML IJ SOLN
30.0000 mg | Freq: Once | INTRAMUSCULAR | Status: AC
  Administered 2014-12-16: 30 mg via INTRAVENOUS
  Filled 2014-12-16: qty 1

## 2014-12-16 MED ORDER — SODIUM CHLORIDE 0.9 % IV BOLUS (SEPSIS)
1000.0000 mL | Freq: Once | INTRAVENOUS | Status: AC
  Administered 2014-12-16: 1000 mL via INTRAVENOUS

## 2014-12-16 MED ORDER — HYDROCODONE-ACETAMINOPHEN 5-325 MG PO TABS: 1.0000 | ORAL_TABLET | ORAL | Status: DC | PRN

## 2014-12-16 MED ORDER — MORPHINE SULFATE (PF) 4 MG/ML IV SOLN
4.0000 mg | Freq: Once | INTRAVENOUS | Status: AC
  Administered 2014-12-16: 4 mg via INTRAVENOUS
  Filled 2014-12-16: qty 1

## 2014-12-16 NOTE — ED Notes (Signed)
Pt reports 2 weeks of low grade fever and body aches, mostly leg pain. Pt reports symptoms started after she had a dental procedure. Reports pain is legs is "so bad I can barely walk"

## 2014-12-16 NOTE — Discharge Instructions (Signed)
Please read and follow all provided instructions.  Your diagnoses today include:  1. Myalgia   2. Arthralgia   3. Leukocytosis     Tests performed today include:  Blood cell count - slightly high white blood cell count  Electrolytes, kidney function - normal  Urine test - normal  Sedimentation rate - normal  Creatine kinase for muscle breakdown - normal  Vital signs. See below for your results today.   Medications prescribed:   Vicodin (hydrocodone/acetaminophen) - narcotic pain medication  DO NOT drive or perform any activities that require you to be awake and alert because this medicine can make you drowsy. BE VERY CAREFUL not to take multiple medicines containing Tylenol (also called acetaminophen). Doing so can lead to an overdose which can damage your liver and cause liver failure and possibly death.  Take any prescribed medications only as directed.  Home care instructions:  Follow any educational materials contained in this packet.  BE VERY CAREFUL not to take multiple medicines containing Tylenol (also called acetaminophen). Doing so can lead to an overdose which can damage your liver and cause liver failure and possibly death.   Follow-up instructions: Please follow-up with your primary care provider in the next 7 days for further evaluation of your symptoms.   Return instructions:   Please return to the Emergency Department if you experience worsening symptoms.   Please return if you have any other emergent concerns.  Additional Information:  Your vital signs today were: BP 121/75 mmHg   Pulse 64   Temp(Src) 99.4 F (37.4 C) (Oral)   Resp 16   Ht 5\' 5"  (1.651 m)   SpO2 99%   LMP 11/19/2014 (Approximate) If your blood pressure (BP) was elevated above 135/85 this visit, please have this repeated by your doctor within one month. --------------

## 2014-12-16 NOTE — ED Provider Notes (Signed)
CSN: [id number]     Arrival date & time 12/16/14  1551 History   First MD Initiated Contact with Patient 12/16/14 1735     Chief Complaint  Patient presents with  . Fever     (Consider location/radiation/quality/duration/timing/severity/associated sxs/prior Treatment) HPI Comments: Patient presents with complaint of two-week history of low-grade fever, generalized arthralgias and lower extremity muscle aches. Patient reports having similar symptoms in the past for which she saw several doctors who could not give her a diagnosis. Patient states that she feels weak in her legs and is aching in her thighs and her joints. She states that she can "barely walk". Prior to this, patient had a dental procedure where she had several teeth filled. Patient denies URI symptoms, cough, chest pain, shortness of breath. No nausea, vomiting, abdominal pain, urinary symptoms. No rash or recent tick bites. No recent travel. The highest temperature that the patient has recorded was 99.5F. She has been treating at home with ibuprofen, Goody powder, Tylenol. Onset of symptoms acute. Course is constant.  Patient is a 39 y.o. female presenting with fever. The history is provided by the patient.  Fever Associated symptoms: headaches and myalgias   Associated symptoms: no chest pain, no chills, no cough, no diarrhea, no dysuria, no nausea, no rash, no rhinorrhea, no sore throat and no vomiting     Past Medical History  Diagnosis Date  . Migraine   . Thyroid disease    History reviewed. No pertinent past surgical history. No family history on file. Social History  Substance Use Topics  . Smoking status: Current Every Day Smoker -- 1.00 packs/day for 20 years    Types: Cigarettes  . Smokeless tobacco: Never Used  . Alcohol Use: No   OB History    No data available     Review of Systems  Constitutional: Positive for fever and fatigue. Negative for chills.  HENT: Negative for rhinorrhea and sore throat.    Eyes: Negative for photophobia and redness.  Respiratory: Negative for cough.   Cardiovascular: Negative for chest pain.  Gastrointestinal: Negative for nausea, vomiting, abdominal pain and diarrhea.  Genitourinary: Negative for dysuria.  Musculoskeletal: Positive for myalgias and arthralgias.  Skin: Negative for rash.  Neurological: Positive for headaches.      Allergies  Compazine; Ketamine; Reglan; and Triptans  Home Medications   Prior to Admission medications   Medication Sig Start Date End Date Taking? Authorizing Provider  ALPRAZolam Duanne Moron) 1 MG tablet Take 1 mg by mouth at bedtime as needed.   Yes Historical Provider, MD  butalbital-acetaminophen-caffeine (FIORICET WITH CODEINE) 50-325-40-30 MG per capsule Take 1 capsule by mouth every 4 (four) hours as needed for headache. 01/25/12  Yes Riki Altes, MD  butorphanol (STADOL) 10 MG/ML nasal spray Place 1 spray into the nose every 4 (four) hours as needed. 07/11/13  Yes Carmin Muskrat, MD   BP 133/93 mmHg  Pulse 88  Temp(Src) 98.5 F (36.9 C) (Oral)  Resp 16  Ht 5\' 5"  (1.651 m)  SpO2 98%  LMP 11/19/2014 (Approximate)   Physical Exam  Constitutional: She appears well-developed and well-nourished.  HENT:  Head: Normocephalic and atraumatic.  Right Ear: External ear normal.  Left Ear: External ear normal.  Nose: Nose normal.  Mouth/Throat: Oropharynx is clear and moist. No oropharyngeal exudate.  Eyes: Conjunctivae are normal. Right eye exhibits no discharge. Left eye exhibits no discharge.  Neck: Normal range of motion. Neck supple.  Cardiovascular: Normal rate, regular rhythm and normal heart sounds.  No murmur heard. No murmur.  Pulmonary/Chest: Effort normal and breath sounds normal. No respiratory distress. She has no wheezes. She has no rales.  Abdominal: Soft. There is no tenderness. There is no rebound and no guarding.  Neurological: She is alert.  Skin: Skin is warm and dry.  No rashes or splinter  hemorrhages.  Psychiatric: Her affect is labile.  Nursing note and vitals reviewed.   ED Course  Procedures (including critical care time) Labs Review Labs Reviewed  CBC WITH DIFFERENTIAL/PLATELET - Abnormal; Notable for the following:    WBC 11.6 (*)    Platelets 401 (*)    Monocytes Absolute 1.1 (*)    All other components within normal limits  COMPREHENSIVE METABOLIC PANEL - Abnormal; Notable for the following:    Calcium 8.7 (*)    Total Protein 6.4 (*)    AST 14 (*)    ALT 13 (*)    Total Bilirubin 0.2 (*)    All other components within normal limits  CK - Abnormal; Notable for the following:    Total CK 23 (*)    All other components within normal limits  URINALYSIS, ROUTINE W REFLEX MICROSCOPIC (NOT AT [facility])  SEDIMENTATION RATE    Imaging Review No results found. I have personally reviewed and evaluated these images and lab results as part of my medical decision-making.   EKG Interpretation None       5:48 PM Patient seen and examined. Work-up initiated. Medications ordered.   Vital signs reviewed and are as follows: BP 133/93 mmHg  Pulse 88  Temp(Src) 98.5 F (36.9 C) (Oral)  Resp 16  Ht 5\' 5"  (1.651 m)  SpO2 98%  LMP 11/19/2014 (Approximate)  7:58 PM patient updated on lab results. She is not feeling better after fluids and Toradol. Will give pain medicine and have patient ambulate. She will need PCP referral.   Has ambulated. She feels somewhat better after pain medication. Will discharge to home. Patient given PCP follow-up and encouraged follow-up. Return to the emergency department with worsening symptoms, high fever, inability to walk, new symptoms or other concerns. Patient verbalizes understanding and agrees with plan.  Patient counseled on use of narcotic pain medications. Counseled not to combine these medications with others containing tylenol. Urged not to drink alcohol, drive, or perform any other activities that requires focus while taking  these medications. The patient verbalizes understanding and agrees with the plan.   MDM   Final diagnoses:  Myalgia  Arthralgia  Leukocytosis    Patient with multiple nonspecific complaints, however no concerning findings on exam or blood work today. Feel that she would benefit from PCP follow-up for further evaluation. No recent tick bites or other concerning exposures. Sedimentation rate and CK are normal. Normal kidney function.  Carlisle Cater, PA-C 12/16/14 [place], MD 12/16/14 [phone]

## 2014-12-18 ENCOUNTER — Emergency Department (HOSPITAL_BASED_OUTPATIENT_CLINIC_OR_DEPARTMENT_OTHER)
Admission: EM | Admit: 2014-12-18 | Discharge: 2014-12-18 | Disposition: A | Discharge status: Home / Self Care | Payer: BLUE CROSS/BLUE SHIELD | Attending: Emergency Medicine | Admitting: Emergency Medicine

## 2014-12-18 ENCOUNTER — Encounter (HOSPITAL_BASED_OUTPATIENT_CLINIC_OR_DEPARTMENT_OTHER): Payer: Self-pay

## 2014-12-18 DIAGNOSIS — Z79899 Other long term (current) drug therapy: Secondary | ICD-10-CM | POA: Insufficient documentation

## 2014-12-18 DIAGNOSIS — R509 Fever, unspecified: Secondary | ICD-10-CM | POA: Diagnosis not present

## 2014-12-18 DIAGNOSIS — Z8639 Personal history of other endocrine, nutritional and metabolic disease: Secondary | ICD-10-CM | POA: Insufficient documentation

## 2014-12-18 DIAGNOSIS — M791 Myalgia, unspecified site: Secondary | ICD-10-CM

## 2014-12-18 DIAGNOSIS — M79605 Pain in left leg: Secondary | ICD-10-CM | POA: Diagnosis not present

## 2014-12-18 DIAGNOSIS — M79604 Pain in right leg: Secondary | ICD-10-CM | POA: Insufficient documentation

## 2014-12-18 DIAGNOSIS — G43909 Migraine, unspecified, not intractable, without status migrainosus: Secondary | ICD-10-CM | POA: Insufficient documentation

## 2014-12-18 DIAGNOSIS — R630 Anorexia: Secondary | ICD-10-CM | POA: Insufficient documentation

## 2014-12-18 DIAGNOSIS — G8929 Other chronic pain: Secondary | ICD-10-CM | POA: Diagnosis not present

## 2014-12-18 DIAGNOSIS — F17210 Nicotine dependence, cigarettes, uncomplicated: Secondary | ICD-10-CM | POA: Diagnosis not present

## 2014-12-18 DIAGNOSIS — M2550 Pain in unspecified joint: Secondary | ICD-10-CM | POA: Diagnosis not present

## 2014-12-18 DIAGNOSIS — R5383 Other fatigue: Secondary | ICD-10-CM | POA: Insufficient documentation

## 2014-12-18 HISTORY — DX: Other chronic pain: G89.29

## 2014-12-18 MED ORDER — PREDNISONE 50 MG PO TABS
60.0000 mg | ORAL_TABLET | Freq: Once | ORAL | Status: AC
  Administered 2014-12-18: 15:00:00 60 mg via ORAL
  Filled 2014-12-18: qty 1

## 2014-12-18 MED ORDER — PREDNISONE 20 MG PO TABS: 40.0000 mg | ORAL_TABLET | Freq: Every day | ORAL | Status: DC

## 2014-12-18 NOTE — Discharge Instructions (Signed)
Joint Pain Joint pain, which is also called arthralgia, can be caused by many things. Joint pain often goes away when you follow your health care provider's instructions for relieving pain at home. However, joint pain can also be caused by conditions that require further treatment. Common causes of joint pain include:  Bruising in the area of the joint.  Overuse of the joint.  Wear and tear on the joints that occur with aging (osteoarthritis).  Various other forms of arthritis.  A buildup of a crystal form of uric acid in the joint (gout).  Infections of the joint (septic arthritis) or of the bone (osteomyelitis). Your health care provider may recommend medicine to help with the pain. If your joint pain continues, additional tests may be needed to diagnose your condition. HOME CARE INSTRUCTIONS Watch your condition for any changes. Follow these instructions as directed to lessen the pain that you are feeling.  Take medicines only as directed by your health care provider.  Rest the affected area for as long as your health care provider says that you should. If directed to do so, raise the painful joint above the level of your heart while you are sitting or lying down.  Do not do things that cause or worsen pain.  If directed, apply ice to the painful area:  Put ice in a plastic bag.  Place a towel between your skin and the bag.  Leave the ice on for 20 minutes, 2-3 times per day.  Wear an elastic bandage, splint, or sling as directed by your health care provider. Loosen the elastic bandage or splint if your fingers or toes become numb and tingle, or if they turn cold and blue.  Begin exercising or stretching the affected area as directed by your health care provider. Ask your health care provider what types of exercise are safe for you.  Keep all follow-up visits as directed by your health care provider. This is important. SEEK MEDICAL CARE IF:  Your pain increases, and medicine  does not help.  Your joint pain does not improve within 3 days.  You have increased bruising or swelling.  You have a fever.  You lose 10 lb (4.5 kg) or more without trying. SEEK IMMEDIATE MEDICAL CARE IF:  You are not able to move the joint.  Your fingers or toes become numb or they turn cold and blue.   This information is not intended to replace advice given to you by your health care provider. Make sure you discuss any questions you have with your health care provider.   Document Released: 01/05/2005 Document Revised: 01/26/2014 Document Reviewed: 10/17/2013 Elsevier Interactive Patient Education 2016 [employer]  Muscle Pain, Adult Muscle pain (myalgia) may be caused by many things, including:  Overuse or muscle strain, especially if you are not in shape. This is the most common cause of muscle pain.  Injury.  Bruises.  Viruses, such as the flu.  Infectious diseases.  Fibromyalgia, which is a chronic condition that causes muscle tenderness, fatigue, and headache.  Autoimmune diseases, including lupus.  Certain drugs, including ACE inhibitors and statins. Muscle pain may be mild or severe. In most cases, the pain lasts only a short time and goes away without treatment. To diagnose the cause of your muscle pain, your health care provider will take your medical history. This means he or she will ask you when your muscle pain began and what has been happening. If you have not had muscle pain for very long,  your health care provider may want to wait before doing much testing. If your muscle pain has lasted a long time, your health care provider may want to run tests right away. If your health care provider thinks your muscle pain may be caused by illness, you may need to have additional tests to rule out certain conditions.  Treatment for muscle pain depends on the cause. Home care is often enough to relieve muscle pain. Your health care provider may also prescribe  anti-inflammatory medicine. HOME CARE INSTRUCTIONS Watch your condition for any changes. The following actions may help to lessen any discomfort you are feeling:  Only take over-the-counter or prescription medicines as directed by your health care provider.  Apply ice to the sore muscle:  Put ice in a plastic bag.  Place a towel between your skin and the bag.  Leave the ice on for 15-20 minutes, 3-4 times a day.  You may alternate applying hot and cold packs to the muscle as directed by your health care provider.  If overuse is causing your muscle pain, slow down your activities until the pain goes away.  Remember that it is normal to feel some muscle pain after starting a workout program. Muscles that have not been used often will be sore at first.  Do regular, gentle exercises if you are not usually active.  Warm up before exercising to lower your risk of muscle pain.  Do not continue working out if the pain is very bad. Bad pain could mean you have injured a muscle. SEEK MEDICAL CARE IF:  Your muscle pain gets worse, and medicines do not help.  You have muscle pain that lasts longer than 3 days.  You have a rash or fever along with muscle pain.  You have muscle pain after a tick bite.  You have muscle pain while working out, even though you are in good physical condition.  You have redness, soreness, or swelling along with muscle pain.  You have muscle pain after starting a new medicine or changing the dose of a medicine. SEEK IMMEDIATE MEDICAL CARE IF:  You have trouble breathing.  You have trouble swallowing.  You have muscle pain along with a stiff neck, fever, and vomiting.  You have severe muscle weakness or cannot move part of your body. MAKE SURE YOU:   Understand these instructions.  Will watch your condition.  Will get help right away if you are not doing well or get worse.   This information is not intended to replace advice given to you by your  health care provider. Make sure you discuss any questions you have with your health care provider.   Document Released: 11/27/2005 Document Revised: 01/26/2014 Document Reviewed: 11/01/2012 Elsevier Interactive Patient Education [employer].

## 2014-12-18 NOTE — ED Notes (Signed)
C/o bilat leg pain-seen here for same 2 days ago-states she also fell on wet deck today-pain to left buttock area-pt with steady gait-NAD

## 2014-12-20 NOTE — ED Provider Notes (Signed)
CSN: [id number]     Arrival date & time 12/18/14  1233 History   First MD Initiated Contact with Patient 12/18/14 1343     Chief Complaint  Patient presents with  . Leg Pain     (Consider location/radiation/quality/duration/timing/severity/associated sxs/prior Treatment) Patient is a 39 y.o. female presenting with leg pain. The history is provided by the patient.  Leg Pain Associated symptoms: fatigue and fever   patient with pain in her legs, joints and low grade fevers. Recently seen in the ED for the same and has seen a PCP. Has follow up iwht a new PCP. No cause found previously. Continued pain. Has had similar episodes in the past No relief with pain meds. Current symptoms for the last 2 weeks.   Past Medical History  Diagnosis Date  . Migraine   . Thyroid disease   . Chronic pain    History reviewed. No pertinent past surgical history. No family history on file. Social History  Substance Use Topics  . Smoking status: Current Every Day Smoker -- 1.00 packs/day for 20 years    Types: Cigarettes  . Smokeless tobacco: Never Used  . Alcohol Use: No   OB History    No data available     Review of Systems  Constitutional: Positive for fever, appetite change and fatigue.  Respiratory: Negative for shortness of breath.   Cardiovascular: Negative for chest pain.  Gastrointestinal: Negative for abdominal pain.  Musculoskeletal: Positive for myalgias and arthralgias. Negative for joint swelling.  Skin: Negative for wound.  Neurological: Positive for headaches.  Psychiatric/Behavioral: Negative for confusion.      Allergies  Compazine; Ketamine; Reglan; and Triptans  Home Medications   Prior to Admission medications   Medication Sig Start Date End Date Taking? Authorizing Provider  ALPRAZolam Duanne Moron) 1 MG tablet Take 1 mg by mouth at bedtime as needed.    Historical Provider, MD  butalbital-acetaminophen-caffeine (FIORICET WITH CODEINE) 50-325-40-30 MG per capsule  Take 1 capsule by mouth every 4 (four) hours as needed for headache. 01/25/12   Riki Altes, MD  butorphanol (STADOL) 10 MG/ML nasal spray Place 1 spray into the nose every 4 (four) hours as needed. 07/11/13   Carmin Muskrat, MD  HYDROcodone-acetaminophen (NORCO/VICODIN) 5-325 MG tablet Take 1-2 tablets by mouth every 4 (four) hours as needed. 12/16/14   Carlisle Cater, PA-C  predniSONE (DELTASONE) 20 MG tablet Take 2 tablets (40 mg total) by mouth daily. 12/19/14   Davonna Belling, MD   BP 114/78 mmHg  Pulse 61  Temp(Src) 98.6 F (37 C) (Oral)  Resp 16  Ht 5\' 5"  (1.651 m)  Wt 120 lb (54.432 kg)  BMI 19.97 kg/m2  SpO2 98%  LMP 11/19/2014 (Approximate) Physical Exam  Constitutional: She appears well-developed.  HENT:  Head: Atraumatic.  Neck: Neck supple.  Cardiovascular: Normal rate.   Pulmonary/Chest: Effort normal.  Abdominal: Soft.  Musculoskeletal: She exhibits tenderness.  Tenderness over bilateral lower extremities.   Skin: Skin is warm.  Psychiatric: Her behavior is normal.    ED Course  Procedures (including critical care time) Labs Review Labs Reviewed - No data to display  Imaging Review No results found. I have personally reviewed and evaluated these images and lab results as part of my medical decision-making.   EKG Interpretation None      MDM   Final diagnoses:  Arthralgia  Myalgia    Patient with pain in joints and muscles. No clear cause. Extensive workup from 2 days ago reviewed. Will give  short course of steroids. May need rheum or endocrine follow up.    Davonna Belling, MD 12/20/14 [phone]

## 2015-01-16 ENCOUNTER — Encounter (HOSPITAL_COMMUNITY): Payer: Self-pay | Admitting: *Deleted

## 2015-01-16 ENCOUNTER — Emergency Department (HOSPITAL_COMMUNITY)
Admission: EM | Admit: 2015-01-16 | Discharge: 2015-01-16 | Discharge status: Against Medical Advice | Payer: BLUE CROSS/BLUE SHIELD | Attending: Emergency Medicine | Admitting: Emergency Medicine

## 2015-01-16 DIAGNOSIS — R509 Fever, unspecified: Secondary | ICD-10-CM | POA: Diagnosis present

## 2015-01-16 DIAGNOSIS — D72829 Elevated white blood cell count, unspecified: Secondary | ICD-10-CM | POA: Diagnosis not present

## 2015-01-16 DIAGNOSIS — R531 Weakness: Secondary | ICD-10-CM | POA: Diagnosis not present

## 2015-01-16 DIAGNOSIS — M2550 Pain in unspecified joint: Secondary | ICD-10-CM | POA: Diagnosis not present

## 2015-01-16 DIAGNOSIS — F17210 Nicotine dependence, cigarettes, uncomplicated: Secondary | ICD-10-CM | POA: Insufficient documentation

## 2015-01-16 NOTE — ED Notes (Addendum)
Pt complains of fever, joint pain, weakness for the past 2 weeks. Pt states she was seen for same last month at the ED and was put on prednisone, which she states helped for a few days. Pt was told to see specialist but states she was unable to get appointment over the holidays. Pt states she has had elevated WBCs but is not sure of the source. Pt states she has taken ibuprofen, Toradol without relief.

## 2015-04-02 ENCOUNTER — Emergency Department (HOSPITAL_BASED_OUTPATIENT_CLINIC_OR_DEPARTMENT_OTHER): Payer: BLUE CROSS/BLUE SHIELD

## 2015-04-02 ENCOUNTER — Encounter (HOSPITAL_BASED_OUTPATIENT_CLINIC_OR_DEPARTMENT_OTHER): Payer: Self-pay | Admitting: *Deleted

## 2015-04-02 ENCOUNTER — Emergency Department (HOSPITAL_BASED_OUTPATIENT_CLINIC_OR_DEPARTMENT_OTHER)
Admission: EM | Admit: 2015-04-02 | Discharge: 2015-04-02 | Disposition: A | Discharge status: Home / Self Care | Payer: BLUE CROSS/BLUE SHIELD | Attending: Emergency Medicine | Admitting: Emergency Medicine

## 2015-04-02 DIAGNOSIS — Z87448 Personal history of other diseases of urinary system: Secondary | ICD-10-CM | POA: Diagnosis not present

## 2015-04-02 DIAGNOSIS — G8929 Other chronic pain: Secondary | ICD-10-CM | POA: Insufficient documentation

## 2015-04-02 DIAGNOSIS — F17210 Nicotine dependence, cigarettes, uncomplicated: Secondary | ICD-10-CM | POA: Insufficient documentation

## 2015-04-02 DIAGNOSIS — Z7952 Long term (current) use of systemic steroids: Secondary | ICD-10-CM | POA: Insufficient documentation

## 2015-04-02 DIAGNOSIS — R509 Fever, unspecified: Secondary | ICD-10-CM | POA: Diagnosis not present

## 2015-04-02 DIAGNOSIS — Z3202 Encounter for pregnancy test, result negative: Secondary | ICD-10-CM | POA: Insufficient documentation

## 2015-04-02 DIAGNOSIS — Z8639 Personal history of other endocrine, nutritional and metabolic disease: Secondary | ICD-10-CM | POA: Insufficient documentation

## 2015-04-02 DIAGNOSIS — R05 Cough: Secondary | ICD-10-CM | POA: Insufficient documentation

## 2015-04-02 DIAGNOSIS — R1031 Right lower quadrant pain: Secondary | ICD-10-CM

## 2015-04-02 DIAGNOSIS — R112 Nausea with vomiting, unspecified: Secondary | ICD-10-CM | POA: Diagnosis not present

## 2015-04-02 LAB — URINALYSIS, ROUTINE W REFLEX MICROSCOPIC
BILIRUBIN URINE: NEGATIVE
Glucose, UA: NEGATIVE mg/dL
Hgb urine dipstick: NEGATIVE
KETONES UR: NEGATIVE mg/dL
Leukocytes, UA: NEGATIVE
NITRITE: NEGATIVE
PROTEIN: NEGATIVE mg/dL
Specific Gravity, Urine: 1.045 — ABNORMAL HIGH (ref 1.005–1.030)
pH: 5.5 (ref 5.0–8.0)

## 2015-04-02 LAB — COMPREHENSIVE METABOLIC PANEL
ALT: 19 U/L (ref 14–54)
AST: 17 U/L (ref 15–41)
Albumin: 4.1 g/dL (ref 3.5–5.0)
Alkaline Phosphatase: 46 U/L (ref 38–126)
Anion gap: 11 (ref 5–15)
BILIRUBIN TOTAL: 0.4 mg/dL (ref 0.3–1.2)
BUN: 14 mg/dL (ref 6–20)
CALCIUM: 9.3 mg/dL (ref 8.9–10.3)
CO2: 17 mmol/L — ABNORMAL LOW (ref 22–32)
CREATININE: 0.76 mg/dL (ref 0.44–1.00)
Chloride: 108 mmol/L (ref 101–111)
GFR calc Af Amer: >60 mL/min (ref >60)
Glucose, Bld: 81 mg/dL (ref 65–99)
Potassium: 3.5 mmol/L (ref 3.5–5.1)
Sodium: 136 mmol/L (ref 135–145)
TOTAL PROTEIN: 7.7 g/dL (ref 6.5–8.1)

## 2015-04-02 LAB — CBC WITH DIFFERENTIAL/PLATELET
BASOS ABS: 0 10*3/uL (ref 0.0–0.1)
Basophils Relative: 0 %
Eosinophils Absolute: 0.1 10*3/uL (ref 0.0–0.7)
Eosinophils Relative: 1 %
HEMATOCRIT: 41.9 % (ref 36.0–46.0)
Hemoglobin: 14.4 g/dL (ref 12.0–15.0)
LYMPHS ABS: 4.1 10*3/uL — AB (ref 0.7–4.0)
LYMPHS PCT: 34 %
MCH: 31.9 pg (ref 26.0–34.0)
MCHC: 34.4 g/dL (ref 30.0–36.0)
MCV: 92.7 fL (ref 78.0–100.0)
MONO ABS: 0.6 10*3/uL (ref 0.1–1.0)
Monocytes Relative: 5 %
NEUTROS ABS: 7.3 10*3/uL (ref 1.7–7.7)
Neutrophils Relative %: 60 %
Platelets: 430 10*3/uL — ABNORMAL HIGH (ref 150–400)
RBC: 4.52 MIL/uL (ref 3.87–5.11)
RDW: 12.7 % (ref 11.5–15.5)
WBC: 12.1 10*3/uL — AB (ref 4.0–10.5)

## 2015-04-02 LAB — URINE MICROSCOPIC-ADD ON

## 2015-04-02 LAB — PREGNANCY, URINE: PREG TEST UR: NEGATIVE

## 2015-04-02 LAB — LIPASE, BLOOD: LIPASE: 21 U/L (ref 11–51)

## 2015-04-02 MED ORDER — SODIUM CHLORIDE 0.9 % IV BOLUS (SEPSIS)
500.0000 mL | Freq: Once | INTRAVENOUS | Status: AC
  Administered 2015-04-02: 500 mL via INTRAVENOUS

## 2015-04-02 MED ORDER — SODIUM CHLORIDE 0.9 % IV SOLN
Freq: Once | INTRAVENOUS | Status: AC
  Administered 2015-04-02: 20:00:00 via INTRAVENOUS

## 2015-04-02 MED ORDER — IOHEXOL 300 MG/ML  SOLN
100.0000 mL | Freq: Once | INTRAMUSCULAR | Status: AC | PRN
  Administered 2015-04-02: 100 mL via INTRAVENOUS

## 2015-04-02 MED ORDER — ONDANSETRON 4 MG PO TBDP: 4.0000 mg | ORAL_TABLET | Freq: Three times a day (TID) | ORAL | Status: DC | PRN

## 2015-04-02 MED ORDER — ONDANSETRON HCL 4 MG/2ML IJ SOLN
4.0000 mg | Freq: Once | INTRAMUSCULAR | Status: AC
  Administered 2015-04-02: 4 mg via INTRAVENOUS
  Filled 2015-04-02: qty 2

## 2015-04-02 MED ORDER — DICYCLOMINE HCL 10 MG PO CAPS
20.0000 mg | ORAL_CAPSULE | Freq: Once | ORAL | Status: AC
  Administered 2015-04-02: 20 mg via ORAL
  Filled 2015-04-02: qty 2

## 2015-04-02 MED ORDER — MORPHINE SULFATE (PF) 4 MG/ML IV SOLN
4.0000 mg | INTRAVENOUS | Status: DC | PRN
  Administered 2015-04-02 (×2): 4 mg via INTRAVENOUS
  Filled 2015-04-02 (×2): qty 1

## 2015-04-02 MED ORDER — ONDANSETRON HCL 4 MG/2ML IJ SOLN
4.0000 mg | Freq: Once | INTRAMUSCULAR | Status: AC
  Administered 2015-04-02: 4 mg via INTRAVENOUS

## 2015-04-02 MED ORDER — IOHEXOL 300 MG/ML  SOLN
50.0000 mL | Freq: Once | INTRAMUSCULAR | Status: AC | PRN
  Administered 2015-04-02: 50 mL via ORAL

## 2015-04-02 MED ORDER — DICYCLOMINE HCL 20 MG PO TABS: 20.0000 mg | ORAL_TABLET | Freq: Two times a day (BID) | ORAL | Status: DC

## 2015-04-02 MED ORDER — ONDANSETRON HCL 4 MG/2ML IJ SOLN
INTRAMUSCULAR | Status: AC
  Filled 2015-04-02: qty 2

## 2015-04-02 NOTE — ED Provider Notes (Signed)
CSN: [id number]     Arrival date & time 04/02/15  1839 History  By signing my name below, I, Altamease Oiler, attest that this documentation has been prepared under the direction and in the presence of Tanna Furry, MD. Electronically Signed: Altamease Oiler, ED Scribe. 04/02/2015. 7:18 PM   Chief Complaint  Patient presents with  . Fever    The history is provided by the patient. No language interpreter was used.   L[place] is a 40 y.o. female with history of thyroid disease and chronic pain who presents to the Emergency Department complaining of new, 9/10 in severity, sharp/searing lower right abdominal pain with onset this morning. The pain was initially more diffuse and felt like indigestion so she took Tums but without relief. Over the course of the day the pain became localized in the lower abdomen and more sharp.  Associated symptoms include nausea, an episode of emesis, and temperature up to 99.3 F. Pt denies dysuria, hematuria, vaginal bleeding, and change in menstrual cycle. She has history of pyelonephritis but not kidney stones.  Her only daily medication is xanax for anxiety and Fioricet for migraines. LNMP was last month.   Past Medical History  Diagnosis Date  . Migraine   . Thyroid disease   . Chronic pain    History reviewed. No pertinent past surgical history. No family history on file. Social History  Substance Use Topics  . Smoking status: Current Every Day Smoker -- 1.00 packs/day for 20 years    Types: Cigarettes  . Smokeless tobacco: Never Used  . Alcohol Use: No   OB History    No data available     Review of Systems  Constitutional: Negative for chills, diaphoresis, appetite change and fatigue. Fever: temperature up to 99.4.  HENT: Negative for mouth sores, sore throat and trouble swallowing.   Eyes: Negative for visual disturbance.  Respiratory: Negative for cough, chest tightness, shortness of breath and wheezing.   Cardiovascular: Negative for  chest pain.  Gastrointestinal: Positive for nausea, vomiting and abdominal pain. Negative for diarrhea and abdominal distention.  Endocrine: Negative for polydipsia, polyphagia and polyuria.  Genitourinary: Negative for dysuria, frequency, hematuria, vaginal bleeding and menstrual problem.  Musculoskeletal: Negative for gait problem.  Skin: Negative for color change, pallor and rash.  Neurological: Negative for dizziness, syncope, light-headedness and headaches.  Hematological: Does not bruise/bleed easily.  Psychiatric/Behavioral: Negative for behavioral problems and confusion.    Allergies  Compazine; Ketamine; Reglan; and Triptans  Home Medications   Prior to Admission medications   Medication Sig Start Date End Date Taking? Authorizing Provider  ALPRAZolam Duanne Moron) 1 MG tablet Take 1 mg by mouth at bedtime as needed.    Historical Provider, MD  butalbital-acetaminophen-caffeine (FIORICET WITH CODEINE) 50-325-40-30 MG per capsule Take 1 capsule by mouth every 4 (four) hours as needed for headache. 01/25/12   Riki Altes, MD  butorphanol (STADOL) 10 MG/ML nasal spray Place 1 spray into the nose every 4 (four) hours as needed. 07/11/13   Carmin Muskrat, MD  dicyclomine (BENTYL) 20 MG tablet Take 1 tablet (20 mg total) by mouth 2 (two) times daily. 04/02/15   Tanna Furry, MD  HYDROcodone-acetaminophen (NORCO/VICODIN) 5-325 MG tablet Take 1-2 tablets by mouth every 4 (four) hours as needed. 12/16/14   Carlisle Cater, PA-C  ondansetron (ZOFRAN ODT) 4 MG disintegrating tablet Take 1 tablet (4 mg total) by mouth every 8 (eight) hours as needed for nausea. 04/02/15   Tanna Furry, MD  predniSONE (DELTASONE)  20 MG tablet Take 2 tablets (40 mg total) by mouth daily. 12/19/14   Davonna Belling, MD   BP 144/77 mmHg  Pulse 115  Temp(Src) 98.6 F (37 C) (Oral)  Resp 22  SpO2 100%  LMP 03/19/2015 Physical Exam  Constitutional: She is oriented to person, place, and time. She appears well-developed and  well-nourished. No distress.  HENT:  Head: Normocephalic.  Eyes: Conjunctivae are normal. Pupils are equal, round, and reactive to light. No scleral icterus.  Neck: Normal range of motion. Neck supple. No thyromegaly present.  Cardiovascular: Normal rate and regular rhythm.  Exam reveals no gallop and no friction rub.   No murmur heard. Pulmonary/Chest: Effort normal and breath sounds normal. No respiratory distress. She has no wheezes. She has no rales.  Abdominal: Soft. Bowel sounds are normal. She exhibits no distension. There is tenderness. There is rebound.  Pain with cough but no referred pain RLQ tenderness and rebound  Musculoskeletal: Normal range of motion.  Neurological: She is alert and oriented to person, place, and time.  Skin: Skin is warm and dry. No rash noted.  Psychiatric: She has a normal mood and affect. Her behavior is normal.    ED Course  Procedures (including critical care time) DIAGNOSTIC STUDIES: Oxygen Saturation is 100% on RA,  normal by my interpretation.    COORDINATION OF CARE: 7:14 PM Discussed treatment plan which includes lab work, CT A/P with contrast, morphine, Zofran, and IVF with pt at bedside and pt agreed to plan.  Labs Review Labs Reviewed  URINALYSIS, ROUTINE W REFLEX MICROSCOPIC (NOT AT [facility]) - Abnormal; Notable for the following:    Color, Urine AMBER (*)    APPearance TURBID (*)    Specific Gravity, Urine 1.045 (*)    All other components within normal limits  URINE MICROSCOPIC-ADD ON - Abnormal; Notable for the following:    Squamous Epithelial / LPF 6-30 (*)    Bacteria, UA MANY (*)    Casts HYALINE CASTS (*)    All other components within normal limits  CBC WITH DIFFERENTIAL/PLATELET - Abnormal; Notable for the following:    WBC 12.1 (*)    Platelets 430 (*)    Lymphs Abs 4.1 (*)    All other components within normal limits  COMPREHENSIVE METABOLIC PANEL - Abnormal; Notable for the following:    CO2 17 (*)    All other  components within normal limits  PREGNANCY, URINE  LIPASE, BLOOD    Imaging Review Ct Abdomen Pelvis W Contrast  04/02/2015  CLINICAL DATA:  Acute onset of right lower quadrant abdominal pain, fever, nausea and vomiting. Initial encounter. EXAM: CT ABDOMEN AND PELVIS WITH CONTRAST TECHNIQUE: Multidetector CT imaging of the abdomen and pelvis was performed using the standard protocol following bolus administration of intravenous contrast. CONTRAST:  130mL OMNIPAQUE IOHEXOL 300 MG/ML  SOLN COMPARISON:  None. FINDINGS: The visualized lung bases are clear. The liver and spleen are unremarkable in appearance. The gallbladder is within normal limits. The pancreas and left adrenal gland are unremarkable. A 1.1 cm hypodensity is noted at the right adrenal gland. The kidneys are unremarkable in appearance. There is no evidence of hydronephrosis. No renal or ureteral stones are seen. No perinephric stranding is appreciated. No free fluid is identified. The small bowel is unremarkable in appearance. The stomach is within normal limits. No acute vascular abnormalities are seen. The appendix is normal in caliber, though not well assessed. There is no evidence for appendicitis. The colon is partially filled  with stool and is unremarkable in appearance. The bladder is moderately distended and grossly unremarkable. The uterus is unremarkable in appearance. The ovaries are relatively symmetric. No suspicious adnexal masses are seen. No inguinal lymphadenopathy is seen. No acute osseous abnormalities are identified. IMPRESSION: 1. No acute abnormality seen within the abdomen or pelvis. 2. Small right 1.1 cm adrenal nodule is not well characterized on this study. Would correlate with labs. Adrenal protocol MRI or CT could be considered for further evaluation, on an elective nonemergent basis. Electronically Signed   By: Garald Balding M.D.   On: 04/02/2015 21:23   I have personally reviewed and evaluated these images and lab  results as part of my medical decision-making.   EKG Interpretation None      MDM   Final diagnoses:  Right lower quadrant abdominal pain   CT scan normal. On reexam she is more comfortable. Has an appetite again. Plan is home, Bentyl, Zofran.  ER with any changing, or worsening symptoms.    I personally performed the services described in this documentation, which was scribed in my presence. The recorded information has been reviewed and is accurate.    Tanna Furry, MD 04/02/15 2141

## 2015-04-02 NOTE — ED Notes (Signed)
Fever, abdominal pain, dysuria yesterday. She took AZO.

## 2015-04-02 NOTE — ED Notes (Signed)
Pt states she has felt like she has a fever. Also having some RLQ pain with rebound tenderness. Vomited x 1.

## 2015-04-02 NOTE — Discharge Instructions (Signed)

## 2015-04-02 NOTE — ED Notes (Signed)
MD at bedside. 

## 2015-05-14 DIAGNOSIS — F4321 Adjustment disorder with depressed mood: Secondary | ICD-10-CM | POA: Diagnosis not present

## 2015-05-14 DIAGNOSIS — J209 Acute bronchitis, unspecified: Secondary | ICD-10-CM | POA: Diagnosis not present

## 2015-05-14 DIAGNOSIS — G43809 Other migraine, not intractable, without status migrainosus: Secondary | ICD-10-CM | POA: Diagnosis not present

## 2015-06-05 DIAGNOSIS — G43009 Migraine without aura, not intractable, without status migrainosus: Secondary | ICD-10-CM | POA: Diagnosis not present

## 2015-06-12 DIAGNOSIS — G43019 Migraine without aura, intractable, without status migrainosus: Secondary | ICD-10-CM | POA: Diagnosis not present

## 2015-06-25 DIAGNOSIS — F4321 Adjustment disorder with depressed mood: Secondary | ICD-10-CM | POA: Diagnosis not present

## 2015-06-25 DIAGNOSIS — F411 Generalized anxiety disorder: Secondary | ICD-10-CM | POA: Diagnosis not present

## 2015-06-25 DIAGNOSIS — F909 Attention-deficit hyperactivity disorder, unspecified type: Secondary | ICD-10-CM | POA: Diagnosis not present

## 2015-06-25 DIAGNOSIS — G43909 Migraine, unspecified, not intractable, without status migrainosus: Secondary | ICD-10-CM | POA: Diagnosis not present

## 2015-07-18 DIAGNOSIS — R5383 Other fatigue: Secondary | ICD-10-CM | POA: Diagnosis not present

## 2015-07-18 DIAGNOSIS — F419 Anxiety disorder, unspecified: Secondary | ICD-10-CM | POA: Diagnosis not present

## 2015-07-18 DIAGNOSIS — G43909 Migraine, unspecified, not intractable, without status migrainosus: Secondary | ICD-10-CM | POA: Diagnosis not present

## 2015-07-18 DIAGNOSIS — F909 Attention-deficit hyperactivity disorder, unspecified type: Secondary | ICD-10-CM | POA: Diagnosis not present

## 2015-08-07 DIAGNOSIS — Z6822 Body mass index (BMI) 22.0-22.9, adult: Secondary | ICD-10-CM | POA: Diagnosis not present

## 2015-08-07 DIAGNOSIS — N921 Excessive and frequent menstruation with irregular cycle: Secondary | ICD-10-CM | POA: Diagnosis not present

## 2015-08-07 DIAGNOSIS — Z3202 Encounter for pregnancy test, result negative: Secondary | ICD-10-CM | POA: Diagnosis not present

## 2015-08-07 DIAGNOSIS — Z01419 Encounter for gynecological examination (general) (routine) without abnormal findings: Secondary | ICD-10-CM | POA: Diagnosis not present

## 2015-08-07 DIAGNOSIS — Z01411 Encounter for gynecological examination (general) (routine) with abnormal findings: Secondary | ICD-10-CM | POA: Diagnosis not present

## 2015-08-15 DIAGNOSIS — Z9119 Patient's noncompliance with other medical treatment and regimen: Secondary | ICD-10-CM | POA: Diagnosis not present

## 2015-08-15 DIAGNOSIS — F909 Attention-deficit hyperactivity disorder, unspecified type: Secondary | ICD-10-CM | POA: Diagnosis not present

## 2015-08-15 DIAGNOSIS — G43909 Migraine, unspecified, not intractable, without status migrainosus: Secondary | ICD-10-CM | POA: Diagnosis not present

## 2015-08-15 DIAGNOSIS — F419 Anxiety disorder, unspecified: Secondary | ICD-10-CM | POA: Diagnosis not present

## 2015-08-19 ENCOUNTER — Ambulatory Visit (INDEPENDENT_AMBULATORY_CARE_PROVIDER_SITE_OTHER): Payer: BLUE CROSS/BLUE SHIELD | Admitting: Family Medicine

## 2015-08-19 ENCOUNTER — Encounter: Payer: Self-pay | Admitting: Family Medicine

## 2015-08-19 VITALS — BP 120/80 | HR 92 | Temp 98.5°F | Resp 12 | Ht 65.0 in | Wt 125.0 lb

## 2015-08-19 DIAGNOSIS — K589 Irritable bowel syndrome without diarrhea: Secondary | ICD-10-CM

## 2015-08-19 DIAGNOSIS — F410 Panic disorder [episodic paroxysmal anxiety] without agoraphobia: Secondary | ICD-10-CM | POA: Insufficient documentation

## 2015-08-19 DIAGNOSIS — F419 Anxiety disorder, unspecified: Secondary | ICD-10-CM

## 2015-08-19 DIAGNOSIS — G43109 Migraine with aura, not intractable, without status migrainosus: Secondary | ICD-10-CM | POA: Diagnosis not present

## 2015-08-19 DIAGNOSIS — F909 Attention-deficit hyperactivity disorder, unspecified type: Secondary | ICD-10-CM

## 2015-08-19 DIAGNOSIS — F988 Other specified behavioral and emotional disorders with onset usually occurring in childhood and adolescence: Secondary | ICD-10-CM

## 2015-08-19 DIAGNOSIS — G894 Chronic pain syndrome: Secondary | ICD-10-CM | POA: Insufficient documentation

## 2015-08-19 MED ORDER — BUTALBITAL-APAP-CAFF-COD 50-325-40-30 MG PO CAPS: 1.0000 | ORAL_CAPSULE | Freq: Two times a day (BID) | ORAL | 0 refills | Status: DC | PRN

## 2015-08-19 MED ORDER — METHYLPHENIDATE HCL 10 MG PO TABS: ORAL_TABLET | ORAL | 0 refills | Status: DC

## 2015-08-19 NOTE — Progress Notes (Signed)
HPI:   [name] is a 40 y.o. female, who is here today to establish care with me.  Former PCP: [place]  In 06/2015, she was going to [place] to see her psychiatrist, who recently died.  Last preventive routine visit: 2 weeks ago appt with gyn.   Concerns today: Headaches, medications, and colonoscopy.  Hx of migraines since 40 years old, she has not tolerated Imitrex or other medications for prophylaxis.Preceeded by visual aura and associated with photophobia and nausea.  Also tension like headache, occipital, occasional cervical pain. She was following with neurologists in [place]. Currently she is on Fioricet, 1-2 tabs bid, requesting refills.  Hx of anxiety with agoraphobia and depression, she also mentions she has been Dx with personality disorder and bipolar depression. She is on Alprazolam 1 mg tid. She has tried other medications including Zoloft but did not tolerate well, some aggravated depression. She denies suicidal thoughts. Hx of prior hospitalization due to self injuring/suicidal attempt.  Dx with ADD a few years ago, she is on Ritalin 10 mg tid, she needs a Rx.  She also needs referral to psychiatrists.  Hx of IBS, colonoscopy 5 years ago,polypectomy and recommended 5 years f/u. Certain foods exacerbate symptoms, intermittent diarrhea and constipation.   Living with parents. Tried to follow a healthy diet and exercises regularly.     Review of Systems  Constitutional: Negative for activity change, appetite change, fatigue, fever and unexpected weight change.  HENT: Negative for mouth sores, nosebleeds and trouble swallowing.   Eyes: Negative for redness and visual disturbance.  Respiratory: Negative for cough, shortness of breath and wheezing.        + Smoker.  Cardiovascular: Negative for chest pain, palpitations and leg swelling.  Gastrointestinal: Negative for abdominal pain, nausea and vomiting.       Negative for  changes in bowel habits.  Genitourinary: Negative for decreased urine volume, difficulty urinating, dysuria and hematuria.  Musculoskeletal: Negative for arthralgias and back pain.  Skin: Negative for color change and rash.  Neurological: Positive for headaches. Negative for seizures, syncope, weakness and numbness.  Psychiatric/Behavioral: Positive for behavioral problems. Negative for confusion, hallucinations, sleep disturbance and suicidal ideas. The patient is nervous/anxious.       Current Outpatient Prescriptions on File Prior to Visit  Medication Sig Dispense Refill  . ALPRAZolam (XANAX) 1 MG tablet Take 3 mg by mouth at bedtime as needed.     . butorphanol (STADOL) 10 MG/ML nasal spray Place 1 spray into the nose every 4 (four) hours as needed. 2.5 mL 0  . ondansetron (ZOFRAN ODT) 4 MG disintegrating tablet Take 1 tablet (4 mg total) by mouth every 8 (eight) hours as needed for nausea. 6 tablet 0   No current facility-administered medications on file prior to visit.      Past Medical History:  Diagnosis Date  . Chronic pain   . Migraine   . Thyroid disease    Allergies  Allergen Reactions  . Compazine [Prochlorperazine]     lockjaw  . Ketamine     Pt. Reports she never wants again.  . Reglan [Metoclopramide] Other (See Comments)    "crawling out of my skin"  . Triptans Other (See Comments)    Pt states throat feels as if it is closing.    No family history on file.  Social History   Social History  . Marital status: Single    Spouse name: N/A  . Number of children:  N/A  . Years of education: N/A   Social History Main Topics  . Smoking status: Current Every Day Smoker    Packs/day: 1.00    Years: 20.00    Types: Cigarettes  . Smokeless tobacco: Never Used  . Alcohol use No  . Drug use: No  . Sexual activity: Not Currently    Birth control/ protection: None   Other Topics Concern  . None   Social History Narrative  . None    Vitals:   08/19/15  1259  BP: 120/80  Pulse: 92  Resp: 12  Temp: 98.5 F (36.9 C)    Body mass index is 20.8 kg/m.   O2 sat at RA 98%.   Physical Exam  Nursing note and vitals reviewed. Constitutional: She is oriented to person, place, and time. She appears well-developed and well-nourished. No distress.  HENT:  Head: Atraumatic.  Mouth/Throat: Oropharynx is clear and moist and mucous membranes are normal.  Eyes: Conjunctivae and EOM are normal. Pupils are equal, round, and reactive to light.  Cardiovascular: Normal rate and regular rhythm.   No murmur heard. Respiratory: Effort normal and breath sounds normal. No respiratory distress.  GI: Soft. She exhibits no mass. There is no tenderness.  Musculoskeletal: She exhibits no edema.       Cervical back: She exhibits normal range of motion.  Mild tenderness upon palpation of upper cervical paraspinal muscles and lower occipital scalp, bilateral.  Lymphadenopathy:    She has no cervical adenopathy.  Neurological: She is alert and oriented to person, place, and time. She has normal strength. She displays tremor (mild , with intention.). Coordination and gait normal.  Skin: Skin is warm. No erythema.  Tattoos on upper extremities noted.  Psychiatric: Her mood appears anxious. Her affect is labile. She does not exhibit a depressed mood. She expresses no suicidal ideation. She expresses no suicidal plans.  Well groomed, good eye contact.      ASSESSMENT AND PLAN:     Rhanda was seen today for new patient (initial visit).  Diagnoses and all orders for this visit:  Migraine with aura and without status migrainosus, not intractable  Today I agree with one time Rx for Fioricet. Referral placed. Some side effects of medication discussed. Encouraged to quit smoking.   -     Ambulatory referral to Neurology -     butalbital-acetaminophen-caffeine (FIORICET WITH CODEINE) 50-325-40-30 MG capsule; Take 1 capsule by mouth every 12 (twelve) hours  as needed for headache (No more than 3 times per week.).  ADD (attention deficit disorder)  One time Rx given today. Some side effects of medication discussed.   -     Ambulatory referral to Psychiatry -     methylphenidate (RITALIN) 10 MG tablet; Take 10 mg by mouth 3 times daily.  IBS (irritable bowel syndrome)  Reporting colonoscopy 5 years ago, polypectomy.  -     Ambulatory referral to Gastroenterology  Anxiety disorder, unspecified  She states that she has a refill left for  Prior Rx. Some side effects discussed.  -     Ambulatory referral to Psychiatry          -According to [place] controls substance report website: Filled Methylphenidate 10 mg # 90 on 07/25/2015, Alprazolam 1 mg  #90 on 07/25/15, Butorphanol nasal spray on July 5, June 29. June 24 June 18 June 12 of June 06/2015. -She will continue following with psychiatrists and neurologists.  -I will see her back in 3 months for  her routine physical and fasting labs.      Mase Dhondt G. Martin[place], MD  [facility]. [place] office.

## 2015-08-19 NOTE — Progress Notes (Signed)
Pre visit review using our clinic review tool, if applicable. No additional management support is needed unless otherwise documented below in the visit note. 

## 2015-08-19 NOTE — Patient Instructions (Addendum)
A few things to remember from today's visit:   Migraine with aura and without status migrainosus, not intractable - Plan: Ambulatory referral to Neurology  Chronic pain disorder - Plan: Ambulatory referral to Psychiatry  Anxiety disorder, unspecified - Plan: Ambulatory referral to Psychiatry  IBS (irritable bowel syndrome) - Plan: Ambulatory referral to Gastroenterology  Referral to psychiatrists and neurologists placed. You may need to go back to former doctor in [place] for next refill while appt with new provider is arranged.Today one prescription given today.  Please arrange physical in 2-3 months.   Please be sure medication list is accurate. If a new problem present, please set up appointment sooner than planned today.

## 2015-08-20 ENCOUNTER — Encounter: Payer: Self-pay | Admitting: Gastroenterology

## 2015-08-23 DIAGNOSIS — Z1231 Encounter for screening mammogram for malignant neoplasm of breast: Secondary | ICD-10-CM | POA: Diagnosis not present

## 2015-08-23 DIAGNOSIS — N921 Excessive and frequent menstruation with irregular cycle: Secondary | ICD-10-CM | POA: Diagnosis not present

## 2015-09-03 ENCOUNTER — Encounter: Payer: Self-pay | Admitting: Neurology

## 2015-09-03 ENCOUNTER — Ambulatory Visit (INDEPENDENT_AMBULATORY_CARE_PROVIDER_SITE_OTHER): Payer: BLUE CROSS/BLUE SHIELD | Admitting: Neurology

## 2015-09-03 VITALS — BP 120/78 | HR 72 | Ht 65.0 in | Wt 125.1 lb

## 2015-09-03 DIAGNOSIS — R519 Headache, unspecified: Secondary | ICD-10-CM

## 2015-09-03 DIAGNOSIS — G4440 Drug-induced headache, not elsewhere classified, not intractable: Secondary | ICD-10-CM | POA: Insufficient documentation

## 2015-09-03 DIAGNOSIS — R51 Headache: Secondary | ICD-10-CM

## 2015-09-03 DIAGNOSIS — N943 Premenstrual tension syndrome: Secondary | ICD-10-CM | POA: Diagnosis not present

## 2015-09-03 DIAGNOSIS — G4441 Drug-induced headache, not elsewhere classified, intractable: Secondary | ICD-10-CM | POA: Diagnosis not present

## 2015-09-03 DIAGNOSIS — G43839 Menstrual migraine, intractable, without status migrainosus: Secondary | ICD-10-CM

## 2015-09-03 MED ORDER — NORTRIPTYLINE HCL 10 MG PO CAPS: ORAL_CAPSULE | ORAL | 3 refills | Status: DC

## 2015-09-03 MED ORDER — CYCLOBENZAPRINE HCL 5 MG PO TABS: ORAL_TABLET | ORAL | 3 refills | Status: DC

## 2015-09-03 NOTE — Patient Instructions (Addendum)
1.  Start nortriptyline 10mg  at bedtime 2.  Start flexeril 5mg  at bedtime for severe headaches 3.  Start neck physiotherapy 4.  Limit all rescue medications (BC powder and fioricet) 5.  Referral to Dr. Catalina Gravel at [facility]

## 2015-09-03 NOTE — Progress Notes (Signed)
[facility] Clinic Note - Initial Visit   Date: 09/03/15  Laura Simmons MRN: [id number] DOB: [birth year]   Dear Dr. Martinique:  [facility] you [facility] kind [facility]. Although her history is well known to you, please allow [name] to reiterate it for the purpose of our medical record. The patient was accompanied to the clinic by self.    History of Present Illness: Laura Simmons is a 40 y.o. right-handed Caucasian female with bipolar depression, personality disorder, ADD, and agorophobia presenting for evaluation of migraines.    She as a long history of migraines which started at the age of 28, which started with her menstrual cycle.  She has holocephalic severe pressure/pounding of the head.  They usually 4-days, which start 2-3 days prior to her period and usually several days into her period.  She has associated photophobia and nausea.  She was previously having 12 migraines/month but since cutting out fast food, she now gets 4-5 migraines/month.  Triggers include fluorescent lightening, weather changes, sleep deprivation, strong odors, alcohol.   She also complains of occipital tension and neck pain, mostly on the right side of her head which occurs daily.  She takes BC powder in the morning, and then takes fioricet 1-2 tab twice daily for the past 4-5 years.  She has not tried neck PT.   She has tried a number of medications previously including:  depakote, propranolol (fatigue), topiramate (metalic taste), verapamil, zonegran, zoloft, celexa, lexapro, cymbalta, gabapentin, seroquel, triptans (throat closing up), tizanidine.  She does not recall whether she has been on nortriptyline or amitriptyline.  She has not tried Botox.  She has tried sphenopalatine block, without relief.    She was previously seeing Dr. Nicholes Stairs, neurologist in [place], [place] for headaches and moved to [place] in 2016. She  has never seen a headache specialist.   Out-side paper records, electronic medical record, and images have been reviewed where available and summarized as:  CT head 03/07/2012, 12/02/2012:  Normal  Lab Results  Component Value Date   NA 136 04/02/2015   K 3.5 04/02/2015   CL 108 04/02/2015   CO2 17 (L) 04/02/2015   Lab Results  Component Value Date   CREATININE 0.76 04/02/2015    Past Medical History:  Diagnosis Date  . Chronic pain   . Migraine   . Thyroid disease     History reviewed. No pertinent surgical history.   Medications:  Outpatient Encounter Prescriptions as of 09/03/2015  Medication Sig Note  . ALPRAZolam (XANAX) 1 MG tablet Take 3 mg by mouth at bedtime as needed.    . butalbital-acetaminophen-caffeine (FIORICET WITH CODEINE) 50-325-40-30 MG capsule Take 1 capsule by mouth every 12 (twelve) hours as needed for headache (No more than 3 times per week.).   Marland Kitchen HYDROcodone-acetaminophen (NORCO/VICODIN) 5-325 MG tablet TK 1 T PO  Q 4 H PRN. 09/03/2015: Received from: External Pharmacy  . methylphenidate (RITALIN) 10 MG tablet Take 10 mg by mouth 3 times daily.   . ondansetron (ZOFRAN ODT) 4 MG disintegrating tablet Take 1 tablet (4 mg total) by mouth every 8 (eight) hours as needed for nausea.   . butorphanol (STADOL) 10 MG/ML nasal spray Place 1 spray into the nose every 4 (four) hours as needed. (Patient not taking: Reported on 09/03/2015)    No facility-administered encounter medications on file as of 09/03/2015.      Allergies:  Allergies  Allergen Reactions  .  Triptans Other (See Comments) and Anaphylaxis    Pt states throat feels as if it is closing.  . Compazine [Prochlorperazine]     lockjaw  . Ketamine     Pt. Reports she never wants again.  . Reglan [Metoclopramide] Other (See Comments)    "crawling out of my skin"  . Sumatriptan Succinate     ALL TRIPTAN MEDS    Family History: Family History  Problem Relation Age of Onset  . Migraines Mother     . Diabetes Maternal Grandmother   . Stroke Maternal Grandfather   . Heart attack Paternal Grandfather     Social History: Social History  Substance Use Topics  . Smoking status: Current Every Day Smoker    Packs/day: 1.00    Years: 20.00    Types: Cigarettes  . Smokeless tobacco: Never Used  . Alcohol use No   Social History   Social History Narrative   Lives with parents in a one story home.  No children.  Currently not working.  Education: college    Review of Systems:  CONSTITUTIONAL: No fevers, chills, night sweats, or weight loss.   EYES: No visual changes or eye pain ENT: No hearing changes.  No history of nose bleeds.   RESPIRATORY: No cough, wheezing and shortness of breath.   CARDIOVASCULAR: Negative for chest pain, and palpitations.   GI: Negative for abdominal discomfort, blood in stools or black stools.  No recent change in bowel habits.   GU:  No history of incontinence.   MUSCLOSKELETAL: No history of joint pain or swelling.  No myalgias.   SKIN: Negative for lesions, rash, and itching.   HEMATOLOGY/ONCOLOGY: Negative for prolonged bleeding, bruising easily, and swollen nodes.  No history of cancer.   ENDOCRINE: Negative for cold or heat intolerance, polydipsia or goiter.   PSYCH:  +depression or anxiety symptoms.   NEURO: As Above.   Vital Signs:  BP 120/78   Pulse 72   Ht 5\' 5"  (1.651 m)   Wt 125 lb 1 oz (56.7 kg)   BMI 20.81 kg/m    General Medical Exam:   General:  Well appearing, comfortable.   Eyes/ENT: see cranial nerve examination.   Neck: No masses appreciated.  Full range of motion without tenderness.  No carotid bruits. Respiratory:  Clear to auscultation, good air entry bilaterally.   Cardiac:  Regular rate and rhythm, no murmur.   Extremities:  No deformities, edema, or skin discoloration.  Skin:  No rashes or lesions.  Neurological Exam: MENTAL STATUS including orientation to time, place, person, recent and remote memory, attention  span and concentration, language, and fund of knowledge is normal.  Speech is not dysarthric.  CRANIAL NERVES: II:  No visual field defects.  Unremarkable fundi.   III-IV-VI: Pupils equal round and reactive to light.  Normal conjugate, extra-ocular eye movements in all directions of gaze.  No nystagmus.  No ptosis.   V:  Normal facial sensation.    VII:  Normal facial symmetry and movements.  No pathologic facial reflexes.  VIII:  Normal hearing and vestibular function.   IX-X:  Normal palatal movement.   XI:  Normal shoulder shrug and head rotation.   XII:  Normal tongue strength and range of motion, no deviation or fasciculation.  MOTOR:  Motor strength is 5/5 throughout.  No atrophy, fasciculations or abnormal movements.  No pronator drift.  Tone is normal.    MSRs:  Reflexes are 2+/4 throughout.  Plantars are downgoing.  SENSORY:  Normal and symmetric perception of light touch, pinprick, vibration, and proprioception.  Romberg's sign absent.   COORDINATION/GAIT: Normal finger-to- nose-finger and heel-to-shin.  Intact rapid alternating movements bilaterally. Gait narrow based and stable. Tandem and stressed gait intact.    IMPRESSION:  Ms. Zaragoza is a 40 year-old female with bipolar depression referred for evaluation of headaches.  She has a long history of menstrual migraines which started at the age of 15, as well as chronic daily headaches.  She has tried a number of preventative medications including:  depakote, propranolol (fatigue), topiramate (metalic taste), verapamil, zonegran, zoloft, celexa, lexapro, cymbalta, gabapentin, seroquel, triptans (throat closing up), tizanidine, sphenopalatine block, all ineffective or she did not tolerate them.  She has not tried Botox.  Currently, she is taking BC powder and fioricet daily for the past 5 years and undoubtedly also is suffering from medication overuse headaches.  I had a lengthy discussion with patient discussing her various headaches  (1) medication overuse headaches, (2) chronic daily headaches (tension), (3) and menstrual migraine and how to manage each.  I will start her on nortriptyline as a preventative agent and stressed the importance of reducing and eventually stopping her rescue medications, namely BC powder and fioricet, as they are contributing to her worsening headaches.  Long-acting triptan would be ideal for her menstrual migraine, but she has not tolerated triptans previously.     PLAN/RECOMMENDATIONS:  1.  Start nortriptyline 10mg  at bedtime 2.  Start flexeril 5mg  at bedtime for severe headaches 3.  Start neck physiotherapy 4.  Limit all rescue medications, including BC powder and fioricet to twice per week.  She was informed that I will not be writing fioricet as this is contributing to her worsening headaches.  5.  Referral to Headache Clinic with Dr. Catalina Gravel given the complexity of her headaches and multiple medication intolerances  The duration of this appointment visit was 50 minutes of face-to-face time with the patient.  Greater than 50% of this time was spent in counseling, explanation of diagnosis, planning of further management, and coordination of care.   Thank you for allowing me to participate in patient's care.  If I can answer any additional questions, I would be pleased to do so.    Sincerely,    Yong Wahlquist K. Posey Pronto, DO

## 2015-09-04 ENCOUNTER — Other Ambulatory Visit: Payer: Self-pay | Admitting: Obstetrics and Gynecology

## 2015-09-04 DIAGNOSIS — Z1501 Genetic susceptibility to malignant neoplasm of breast: Secondary | ICD-10-CM

## 2015-09-04 DIAGNOSIS — Z1509 Genetic susceptibility to other malignant neoplasm: Principal | ICD-10-CM

## 2015-09-04 NOTE — Progress Notes (Signed)
Referral faxed

## 2015-09-06 ENCOUNTER — Encounter (HOSPITAL_BASED_OUTPATIENT_CLINIC_OR_DEPARTMENT_OTHER): Payer: Self-pay | Admitting: *Deleted

## 2015-09-06 ENCOUNTER — Emergency Department (HOSPITAL_BASED_OUTPATIENT_CLINIC_OR_DEPARTMENT_OTHER)
Admission: EM | Admit: 2015-09-06 | Discharge: 2015-09-06 | Disposition: A | Discharge status: Home / Self Care | Payer: BLUE CROSS/BLUE SHIELD | Attending: Emergency Medicine | Admitting: Emergency Medicine

## 2015-09-06 ENCOUNTER — Emergency Department (HOSPITAL_BASED_OUTPATIENT_CLINIC_OR_DEPARTMENT_OTHER): Payer: BLUE CROSS/BLUE SHIELD

## 2015-09-06 DIAGNOSIS — N83201 Unspecified ovarian cyst, right side: Secondary | ICD-10-CM | POA: Diagnosis not present

## 2015-09-06 DIAGNOSIS — N83291 Other ovarian cyst, right side: Secondary | ICD-10-CM | POA: Diagnosis not present

## 2015-09-06 DIAGNOSIS — R1030 Lower abdominal pain, unspecified: Secondary | ICD-10-CM

## 2015-09-06 DIAGNOSIS — R1031 Right lower quadrant pain: Secondary | ICD-10-CM | POA: Diagnosis not present

## 2015-09-06 DIAGNOSIS — R102 Pelvic and perineal pain: Secondary | ICD-10-CM | POA: Diagnosis not present

## 2015-09-06 DIAGNOSIS — D473 Essential (hemorrhagic) thrombocythemia: Secondary | ICD-10-CM | POA: Diagnosis not present

## 2015-09-06 DIAGNOSIS — F17210 Nicotine dependence, cigarettes, uncomplicated: Secondary | ICD-10-CM | POA: Insufficient documentation

## 2015-09-06 LAB — CBC WITH DIFFERENTIAL/PLATELET
BASOS PCT: 1 %
Band Neutrophils: 0 %
Basophils Absolute: 0.1 10*3/uL (ref 0.0–0.1)
Blasts: 0 %
EOS ABS: 0.1 10*3/uL (ref 0.0–0.7)
EOS PCT: 1 %
HCT: 43.6 % (ref 36.0–46.0)
Hemoglobin: 15.1 g/dL — ABNORMAL HIGH (ref 12.0–15.0)
LYMPHS ABS: 2.9 10*3/uL (ref 0.7–4.0)
LYMPHS PCT: 34 %
MCH: 32.8 pg (ref 26.0–34.0)
MCHC: 34.6 g/dL (ref 30.0–36.0)
MCV: 94.8 fL (ref 78.0–100.0)
MONO ABS: 0.5 10*3/uL (ref 0.1–1.0)
MONOS PCT: 6 %
Metamyelocytes Relative: 0 %
Myelocytes: 0 %
NEUTROS ABS: 4.9 10*3/uL (ref 1.7–7.7)
NEUTROS PCT: 58 %
NRBC: 0 /100{WBCs}
Platelets: 441 10*3/uL — ABNORMAL HIGH (ref 150–400)
Promyelocytes Absolute: 0 %
RBC: 4.6 MIL/uL (ref 3.87–5.11)
RDW: 12.5 % (ref 11.5–15.5)
WBC: 8.5 10*3/uL (ref 4.0–10.5)

## 2015-09-06 LAB — BASIC METABOLIC PANEL
Anion gap: 9 (ref 5–15)
BUN: 13 mg/dL (ref 6–20)
CALCIUM: 9.1 mg/dL (ref 8.9–10.3)
CO2: 21 mmol/L — ABNORMAL LOW (ref 22–32)
CREATININE: 0.9 mg/dL (ref 0.44–1.00)
Chloride: 107 mmol/L (ref 101–111)
GFR calc non Af Amer: >60 mL/min (ref >60)
Glucose, Bld: 74 mg/dL (ref 65–99)
Potassium: 3.7 mmol/L (ref 3.5–5.1)
SODIUM: 137 mmol/L (ref 135–145)

## 2015-09-06 LAB — URINALYSIS, ROUTINE W REFLEX MICROSCOPIC
BILIRUBIN URINE: NEGATIVE
Glucose, UA: NEGATIVE mg/dL
Hgb urine dipstick: NEGATIVE
Ketones, ur: NEGATIVE mg/dL
LEUKOCYTES UA: NEGATIVE
NITRITE: NEGATIVE
PH: 5.5 (ref 5.0–8.0)
Protein, ur: NEGATIVE mg/dL
SPECIFIC GRAVITY, URINE: 1.021 (ref 1.005–1.030)

## 2015-09-06 LAB — LIPASE, BLOOD: Lipase: 18 U/L (ref 11–51)

## 2015-09-06 LAB — WET PREP, GENITAL
SPERM: NONE SEEN
TRICH WET PREP: NONE SEEN
YEAST WET PREP: NONE SEEN

## 2015-09-06 LAB — PREGNANCY, URINE: Preg Test, Ur: NEGATIVE

## 2015-09-06 MED ORDER — HYDROMORPHONE HCL 1 MG/ML IJ SOLN
0.5000 mg | Freq: Once | INTRAMUSCULAR | Status: AC
  Administered 2015-09-06: 0.5 mg via INTRAVENOUS
  Filled 2015-09-06: qty 1

## 2015-09-06 MED ORDER — SODIUM CHLORIDE 0.9 % IV BOLUS (SEPSIS)
1000.0000 mL | Freq: Once | INTRAVENOUS | Status: AC
  Administered 2015-09-06: 1000 mL via INTRAVENOUS

## 2015-09-06 MED ORDER — ONDANSETRON HCL 4 MG/2ML IJ SOLN
4.0000 mg | Freq: Once | INTRAMUSCULAR | Status: AC
  Administered 2015-09-06: 4 mg via INTRAVENOUS
  Filled 2015-09-06: qty 2

## 2015-09-06 MED ORDER — OXYCODONE-ACETAMINOPHEN 5-325 MG PO TABS: 1.0000 | ORAL_TABLET | Freq: Four times a day (QID) | ORAL | 0 refills | Status: DC | PRN

## 2015-09-06 MED ORDER — MORPHINE SULFATE (PF) 4 MG/ML IV SOLN
4.0000 mg | Freq: Once | INTRAVENOUS | Status: AC
  Administered 2015-09-06: 4 mg via INTRAVENOUS
  Filled 2015-09-06: qty 1

## 2015-09-06 MED ORDER — IOPAMIDOL (ISOVUE-300) INJECTION 61%
100.0000 mL | Freq: Once | INTRAVENOUS | Status: AC | PRN
  Administered 2015-09-06: 100 mL via INTRAVENOUS

## 2015-09-06 NOTE — ED Notes (Signed)
Pt gone to US

## 2015-09-06 NOTE — ED Notes (Signed)
Patient transported to Ultrasound 

## 2015-09-06 NOTE — Discharge Instructions (Addendum)
Your blood work today was not perfectly normal, the pathologist is going to perform further tests over the next week. Your primary care doctor will need to obtain these records for management as an outpatient.  Please make an appointment with your OB/GYN for early next week.  Do not hesitate to return to the emergency department for any new, worsening or concerning symptoms.  Take percocet for breakthrough pain, do not drink alcohol, drive, care for children or do other critical tasks while taking percocet.

## 2015-09-06 NOTE — ED Triage Notes (Addendum)
Pt c/o bil low pelvic pain with fever , HX ovarian cyst and chronic pain

## 2015-09-06 NOTE — ED Provider Notes (Signed)
PROGRESS NOTE                                                                                                                 This is a sign-out from [place] at shift change: Laura Simmons is a 40 y.o. female presenting with lower abdominal pain. She was seen by OB/GYN and diagnosed with a 5 cm right-sided ovarian cyst earlier in the week, pain has worsened. Ultrasound shows 4.2 cm right-sided ovarian cyst, with normal flow. Given the decreasing size of the cyst and worsening pain and reported fevers at home and initial tachycardia will obtain CT abdomen pelvis to rule out appendicitis Please refer to previous note for full HPI, ROS, PMH and PE.    LSCTA b/l; Heart is RRR; Abd with mild tenderness palpation in the bilateral lower quadrants with no guarding or rebound. Rovsing negative, psoas negative., guarding or rebound, MAE, goal oriented speech.   CT is without acute abnormality. I think this is largely secondary to her ovarian cyst. Wet prep with many white cells and a few clue cells. It was reported to me that this patient had no cervical motion or adnexal tenderness on pelvic exam. Her GC chlamydia is pending. Patient is overall nontoxic appearing is no sign of TOA. I don't think this is a PID. Is patient denies any abnormal vaginal discharge no indication to treat bacterial vaginosis at this point. Of note there is an abnormality that seen on this patient blood work, lab and sent it for a solid smear review. I discussed this with the patient and she and her she has at her primary care physician will have to follow it up. We've had an extensive discussion of return precautions and I've explained to her that she is at higher risk for torsion given the large nature of this cyst.  Vitals:   09/06/15 1439 09/06/15 1442 09/06/15 1743 09/06/15 1950  BP:  120/94 108/70 111/73  Pulse:  (!) 123 79 66  Resp:  16 18 18   Temp:  98.7 F (37.1 C) 98.7 F (37.1 C)   TempSrc:  Oral Oral   SpO2:  100%  100% 99%  Weight: 56.7 kg     Height: 5\' 5"  (1.651 m)       Medications  sodium chloride 0.9 % bolus 1,000 mL (0 mLs Intravenous Stopped 09/06/15 1719)  morphine 4 MG/ML injection 4 mg (4 mg Intravenous Given 09/06/15 1635)  ondansetron (ZOFRAN) injection 4 mg (4 mg Intravenous Given 09/06/15 1634)  HYDROmorphone (DILAUDID) injection 0.5 mg (0.5 mg Intravenous Given 09/06/15 1740)  iopamidol (ISOVUE-300) 61 % injection 100 mL (100 mLs Intravenous Contrast Given 09/06/15 1832)   Evaluation does not show pathology that would require ongoing emergent intervention or inpatient treatment. Pt is hemodynamically stable and mentating appropriately. Discussed findings and plan with patient/guardian, who agrees with care plan. All questions answered. Return precautions discussed and outpatient follow up given.          Monico Blitz, PA-C 09/06/15 2039  Margette Fast, MD 09/07/15 [phone]

## 2015-09-06 NOTE — ED Provider Notes (Signed)
[facility] Provider Note   CSN: [id number] Arrival date & time: 09/06/15  1436     History   Chief Complaint Chief Complaint  Patient presents with  . Abdominal Pain    HPI Laura Simmons is a 40 y.o. female.  40 year old Caucasian female pmh of ovarian cyst presents to the ED today with bilateral lower pelvic pain and fever. Patient states she was seen at [facility] at the beginning of August  for prolonged menstraul cycles. Transvaginal ultrasound was performed that showed a right 5 cm functional ovarian cyst. She was given pain medicine and told to follow-up at the beginning of September for transvaginal ultrasound. For the past 3 days the pain has gradually worsened. It is a constant burning pain. Patient states pain is in her lower back and radiates to the flanks bilaterally down to lower abdomen and into groin. She also endorses subjective fever up to 100.6. Nothing makes better or worse. She has tried ibuprofen and Zofran with little relief. Pain is associated with nausea no emesis. Patient endorses urinary urgency. Denies dysuria, hematuria, vaginal bleeding, or vaginal discharge. Patient denies any headache, vision changes, emesis, chest pain, shortness of breath, change in bowel habits.  Patient has had ovarian cyst in the past. Patient states the pain is different.  Patient called her GYN doctor today. Her doctor told her to come to the ED if her pain is worse to rule out torsion.    The history is provided by the patient.  Abdominal Pain  Associated symptoms include nausea. Pertinent negatives include fever, diarrhea, vomiting, constipation, dysuria, frequency, hematuria, headaches and arthralgias.    Past Medical History:  Diagnosis Date  . Chronic pain   . Migraine   . Thyroid disease     Patient Active Problem List   Diagnosis Date Noted  . Chronic daily headache 09/03/2015  . Medication overuse headache  09/03/2015  . Chronic pain disorder 08/19/2015  . Anxiety disorder, unspecified 08/19/2015  . IBS (irritable bowel syndrome) 08/19/2015  . Migraine headache 01/06/2013    History reviewed. No pertinent surgical history.  OB History    No data available       Home Medications    Prior to Admission medications   Medication Sig Start Date End Date Taking? Authorizing Provider  ALPRAZolam Duanne Moron) 1 MG tablet Take 3 mg by mouth at bedtime as needed.     Historical Provider, MD  butalbital-acetaminophen-caffeine (FIORICET WITH CODEINE) 50-325-40-30 MG capsule Take 1 capsule by mouth every 12 (twelve) hours as needed for headache (No more than 3 times per week.). 08/19/15   Betty G Martinique, MD  butorphanol (STADOL) 10 MG/ML nasal spray Place 1 spray into the nose every 4 (four) hours as needed. Patient not taking: Reported on 09/03/2015 07/11/13   Carmin Muskrat, MD  cyclobenzaprine (FLEXERIL) 5 MG tablet Take 1 tablet as needed at bedtime for severe headache. 09/03/15   Alda Berthold, DO  HYDROcodone-acetaminophen (NORCO/VICODIN) 5-325 MG tablet TK 1 T PO  Q 4 H PRN. 08/24/15   Historical Provider, MD  methylphenidate (RITALIN) 10 MG tablet Take 10 mg by mouth 3 times daily. 08/19/15   Betty G Martinique, MD  nortriptyline (PAMELOR) 10 MG capsule Take 1 tablet at bedtime x 1 month, then increase to 2 tablet at bedtime. 09/03/15   Donika K Patel, DO  ondansetron (ZOFRAN ODT) 4 MG disintegrating tablet Take 1 tablet (4 mg total) by mouth every 8 (eight) hours  as needed for nausea. 04/02/15   Tanna Furry, MD    Family History Family History  Problem Relation Age of Onset  . Migraines Mother   . Diabetes Maternal Grandmother   . Stroke Maternal Grandfather   . Heart attack Paternal Grandfather     Social History Social History  Substance Use Topics  . Smoking status: Current Every Day Smoker    Packs/day: 1.00    Years: 20.00    Types: Cigarettes  . Smokeless tobacco: Never Used  . Alcohol  use No     Allergies   Triptans; Compazine [prochlorperazine]; Ketamine; Reglan [metoclopramide]; and Sumatriptan succinate   Review of Systems Review of Systems  Constitutional: Negative for chills and fever.  HENT: Negative for ear pain and sore throat.   Eyes: Negative for pain and visual disturbance.  Respiratory: Negative for cough and shortness of breath.   Cardiovascular: Negative for chest pain and palpitations.  Gastrointestinal: Positive for abdominal pain and nausea. Negative for abdominal distention, constipation, diarrhea and vomiting.  Genitourinary: Positive for flank pain, pelvic pain and urgency. Negative for dysuria, frequency, hematuria, vaginal bleeding, vaginal discharge and vaginal pain.  Musculoskeletal: Negative for arthralgias and back pain.  Skin: Negative for color change and rash.  Neurological: Negative for dizziness, syncope, weakness, light-headedness and headaches.  All other systems reviewed and are negative.    Physical Exam Updated Vital Signs BP 108/70 (BP Location: Right Arm)   Pulse 79   Temp 98.7 F (37.1 C) (Oral)   Resp 18   Ht 5\' 5"  (1.651 m)   Wt 56.7 kg   LMP 08/15/2015   SpO2 100%   BMI 20.80 kg/m   Physical Exam  Constitutional: She appears well-developed and well-nourished. No distress.  HENT:  Head: Normocephalic and atraumatic.  Mouth/Throat: Oropharynx is clear and moist.  Eyes: Conjunctivae are normal. Right eye exhibits no discharge. Left eye exhibits no discharge. No scleral icterus.  Neck: Normal range of motion. Neck supple. No thyromegaly present.  Cardiovascular: Normal rate, regular rhythm, normal heart sounds and intact distal pulses.   Pulmonary/Chest: Effort normal and breath sounds normal.  Abdominal: Soft. Bowel sounds are normal. She exhibits no distension. There is tenderness in the right lower quadrant, suprapubic area and left lower quadrant. There is CVA tenderness (Right side). There is no rebound, no  guarding, no tenderness at McBurney's point and negative Murphy's sign.  Negative obturator and psoas sign  Genitourinary: Cervix exhibits no motion tenderness and no discharge. Right adnexum displays tenderness. Right adnexum displays no mass. Left adnexum displays tenderness. Left adnexum displays no mass. No tenderness in the vagina. Vaginal discharge found.  Genitourinary Comments: Chaperon present at bedside.  Musculoskeletal: Normal range of motion.  Lymphadenopathy:    She has no cervical adenopathy.  Neurological: She is alert.  Skin: Skin is warm and dry. Capillary refill takes less than 2 seconds.  Vitals reviewed.    ED Treatments / Results  Labs (all labs ordered are listed, but only abnormal results are displayed) Labs Reviewed  WET PREP, GENITAL  PREGNANCY, URINE  URINALYSIS, ROUTINE W REFLEX MICROSCOPIC (NOT AT [facility])  CBC WITH DIFFERENTIAL/PLATELET  BASIC METABOLIC PANEL  GC/CHLAMYDIA PROBE AMP ([place]) NOT AT [facility]    EKG  EKG Interpretation None       Radiology No results found.  Procedures Procedures (including critical care time)  Medications Ordered in ED Medications  sodium chloride 0.9 % bolus 1,000 mL (not administered)  morphine 4 MG/ML injection  4 mg (not administered)  ondansetron (ZOFRAN) injection 4 mg (not administered)     Initial Impression / Assessment and Plan / ED Course  I have reviewed the triage vital signs and the nursing notes.  Pertinent labs & imaging results that were available during my care of the patient were reviewed by me and considered in my medical decision making (see chart for details).  Clinical Course  Patient presents with bilateral flank pain in the lower abdominal pain. History of 5 cm right ovarian cyst. Patient was concerned for torsion or rupture. Patient tachycardic on arrival to ED. Patient with pain medicine and IV fluids. Heart rate reduced from 123 to 79 after fluids and pain meds. Patient is  afebrile and ED. Transvaginal ultrasound revealed a 4.3 cm right ovarian cyst. Slightly decreased in size from approximately 2 weeks ago per patient. Pelvic exam unremarkable. Patient still in severe pain. Labs unremarkable. No leukocytosis. UA unremarkable. Patient states the pain is new and would like a CAT scan. We'll get CT to rule out acute appendicitis. GC/chlymadia cultures pending. . Awaiting CT results. Low suspicion for appendicitis. Most likely discharge home with follow up to GYN and PCP. Patient verbalized understanding. Hand off given to PA [place] Final Clinical Impressions(s) / ED Diagnoses   Final diagnoses:  None    New Prescriptions New Prescriptions   No medications on file     Doristine Devoid, PA-C 09/06/15 [place], MD 09/07/15 [phone]

## 2015-09-06 NOTE — ED Notes (Signed)
PA at bedside discussing results with patient at this time. 

## 2015-09-09 LAB — GC/CHLAMYDIA PROBE AMP (~~LOC~~) NOT AT ARMC
CHLAMYDIA, DNA PROBE: NEGATIVE
NEISSERIA GONORRHEA: NEGATIVE

## 2015-09-10 DIAGNOSIS — N83209 Unspecified ovarian cyst, unspecified side: Secondary | ICD-10-CM | POA: Diagnosis not present

## 2015-09-10 DIAGNOSIS — N760 Acute vaginitis: Secondary | ICD-10-CM | POA: Diagnosis not present

## 2015-09-10 LAB — PATHOLOGIST SMEAR REVIEW

## 2015-09-12 ENCOUNTER — Encounter: Payer: Self-pay | Admitting: Family Medicine

## 2015-09-13 ENCOUNTER — Encounter (HOSPITAL_BASED_OUTPATIENT_CLINIC_OR_DEPARTMENT_OTHER): Payer: Self-pay | Admitting: Obstetrics and Gynecology

## 2015-09-13 NOTE — H&P (Addendum)
Toluwani Sailer is an 40 y.o. female with known R ovarian cyst who presents for surgical removal. She notes pain x 2-4 weeks causing interference with daily activities. She requires percocet for pain. US performed on 08/23/15 was notable for 4cm simple R ovarian cyst with flow. She also notes irregular menses for the last several cycles - last period was in July. Prior to last few months have had regular menses. She is not sexually active.   Pertinent Gynecological History: Menses: regular until recently as above Bleeding: dysfunctional uterine bleeding Contraception: abstinence Sexually transmitted diseases: no past history Previous GYN Procedures: h/o abnl pap, unsure re: procedures  Last mammogram: normal Date: 08/2015 Last pap: normal Date: 2017 OB History: G0   Menstrual History: Patient's last menstrual period was 08/15/2015.    Past Medical History:  Diagnosis Date  . Chronic pain   . Migraine   . Thyroid disease   . Vaginal Pap smear, abnormal   Agoraphobia Personality D/o BPD Anxiety  No past surgical history on file.  Family History  Problem Relation Age of Onset  . Migraines Mother   . Diabetes Maternal Grandmother   . Stroke Maternal Grandfather   . Heart attack Paternal Grandfather     Social History:  reports that she has been smoking Cigarettes.  She has a 20.00 pack-year smoking history. She has never used smokeless tobacco. She reports that she does not drink alcohol or use drugs.  Allergies:  Allergies  Allergen Reactions  . Triptans Other (See Comments) and Anaphylaxis    Pt states throat feels as if it is closing.  . Compazine [Prochlorperazine]     lockjaw  . Ketamine     Pt. Reports she never wants again.  . Reglan [Metoclopramide] Other (See Comments)    "crawling out of my skin"  . Sumatriptan Succinate     ALL TRIPTAN MEDS    No prescriptions prior to admission.    ROS neg except HPI  Last menstrual period 08/15/2015. Physical Exam  (from 09/10/2015) Gen: NAD but appears uncomf, anxious Pulm: NWOB b/l Cv: Reg rate Abd: soft, non-distended, mild ttp in RLQ, no rebound/guarding.  GYN: fullness in R adnexa, +TTP, no CMT or L sided/midline tenderness. Cervix normal appearing, physiologic d/c, no blood in vault.   No results found for this or any previous visit (from the past 24 hour(s)).  No results found.  HIV, T&S pending  Assessment/Plan: 40 yo G0 presenting with RLQ pain found to have R cyst, here for surgical management. Patient with likely physiologic R-sided simple ovarian cyst. Counseled in clinic for medical management (with oral progestins vs depo as she is not candidate for OCPs given + smoking and h/o migraine w/aura). However, patient w/significant pain affecting daily activities and she opts for expedited surgical management. Therefore plan for [facility] R ovarian cystectomy, possible RSO, possible laparotomy, and any additional procedures. Will also perform endometrial biopsy given recent h/o abnormal uterine bleeding.  All questions answered in clinic. Will sign consent on day of surgery.   [name] 09/13/2015, 12:53 PM   No updates to above H&P. Patient arrived NPO and was consented in PACU. Risks discussed including infection, bleeding, damage to surrounding structures, and possible need for additional procedures. All questions answered. Consent signed. Proceed with above surgery.   Lucillie Garfinkel MD

## 2015-09-16 ENCOUNTER — Encounter (HOSPITAL_BASED_OUTPATIENT_CLINIC_OR_DEPARTMENT_OTHER): Payer: Self-pay | Admitting: *Deleted

## 2015-09-16 NOTE — Progress Notes (Signed)
CALLED AND SPOKE W/  Clella ABOUT LAB ORDERS.  PER DR LEGER CBC AND BMET NOT NEEDED OK TO USE LAB RESULTS FROM 09-06-2015 BUT PT DOES NEED T&S, UPT, AND RAPID HIV TEST.

## 2015-09-16 NOTE — Progress Notes (Signed)
NPO AFTER MN.  ARRIVE AT [id number].  NEEDS URINE PREG., T&S, AND RAPID HIV TEST.  CURRENT CBC AND BMET RESULTS IN CHART AND EPIC.  MAY TAKE RX PAIN/ NAUSEA W/ SIPS OF WATER AM DOS.

## 2015-09-19 ENCOUNTER — Ambulatory Visit (HOSPITAL_BASED_OUTPATIENT_CLINIC_OR_DEPARTMENT_OTHER): Payer: BLUE CROSS/BLUE SHIELD | Admitting: Anesthesiology

## 2015-09-19 ENCOUNTER — Encounter (HOSPITAL_BASED_OUTPATIENT_CLINIC_OR_DEPARTMENT_OTHER)
Admission: RE | Disposition: A | Discharge status: Home / Self Care | Payer: Self-pay | Source: Ambulatory Visit | Attending: Obstetrics and Gynecology

## 2015-09-19 ENCOUNTER — Encounter (HOSPITAL_BASED_OUTPATIENT_CLINIC_OR_DEPARTMENT_OTHER): Payer: Self-pay | Admitting: *Deleted

## 2015-09-19 ENCOUNTER — Ambulatory Visit (HOSPITAL_BASED_OUTPATIENT_CLINIC_OR_DEPARTMENT_OTHER)
Admission: RE | Admit: 2015-09-19 | Discharge: 2015-09-19 | Disposition: A | Discharge status: Home / Self Care | Payer: BLUE CROSS/BLUE SHIELD | Source: Ambulatory Visit | Attending: Obstetrics and Gynecology | Admitting: Obstetrics and Gynecology

## 2015-09-19 DIAGNOSIS — N938 Other specified abnormal uterine and vaginal bleeding: Secondary | ICD-10-CM | POA: Diagnosis not present

## 2015-09-19 DIAGNOSIS — N803 Endometriosis of pelvic peritoneum: Secondary | ICD-10-CM | POA: Diagnosis not present

## 2015-09-19 DIAGNOSIS — N926 Irregular menstruation, unspecified: Secondary | ICD-10-CM | POA: Insufficient documentation

## 2015-09-19 DIAGNOSIS — N83209 Unspecified ovarian cyst, unspecified side: Secondary | ICD-10-CM | POA: Diagnosis present

## 2015-09-19 DIAGNOSIS — F17210 Nicotine dependence, cigarettes, uncomplicated: Secondary | ICD-10-CM | POA: Diagnosis not present

## 2015-09-19 DIAGNOSIS — N809 Endometriosis, unspecified: Secondary | ICD-10-CM | POA: Diagnosis present

## 2015-09-19 DIAGNOSIS — N858 Other specified noninflammatory disorders of uterus: Secondary | ICD-10-CM | POA: Diagnosis not present

## 2015-09-19 DIAGNOSIS — R102 Pelvic and perineal pain: Secondary | ICD-10-CM | POA: Diagnosis not present

## 2015-09-19 DIAGNOSIS — N800 Endometriosis of uterus: Secondary | ICD-10-CM | POA: Diagnosis not present

## 2015-09-19 DIAGNOSIS — N939 Abnormal uterine and vaginal bleeding, unspecified: Secondary | ICD-10-CM | POA: Diagnosis not present

## 2015-09-19 DIAGNOSIS — N83201 Unspecified ovarian cyst, right side: Secondary | ICD-10-CM | POA: Diagnosis not present

## 2015-09-19 DIAGNOSIS — N83291 Other ovarian cyst, right side: Secondary | ICD-10-CM | POA: Diagnosis not present

## 2015-09-19 HISTORY — DX: Bipolar disorder, unspecified: F31.9

## 2015-09-19 HISTORY — DX: Other specified disorders of adrenal gland: E27.8

## 2015-09-19 HISTORY — DX: Agoraphobia, unspecified: F40.00

## 2015-09-19 HISTORY — DX: Unspecified ovarian cyst, right side: N83.201

## 2015-09-19 HISTORY — DX: Unspecified abnormal cytological findings in specimens from vagina: R87.629

## 2015-09-19 HISTORY — PX: LAPAROSCOPIC OVARIAN CYSTECTOMY: SHX6248

## 2015-09-19 HISTORY — DX: Attention-deficit hyperactivity disorder, unspecified type: F90.9

## 2015-09-19 HISTORY — DX: Gastro-esophageal reflux disease without esophagitis: K21.9

## 2015-09-19 HISTORY — DX: Personality disorder, unspecified: F60.9

## 2015-09-19 LAB — ABO/RH: ABO/RH(D): A POS

## 2015-09-19 LAB — TYPE AND SCREEN
ABO/RH(D): A POS
ANTIBODY SCREEN: NEGATIVE

## 2015-09-19 LAB — POCT PREGNANCY, URINE: Preg Test, Ur: NEGATIVE

## 2015-09-19 LAB — RAPID HIV SCREEN (HIV 1/2 AB+AG)
HIV 1/2 Antibodies: NONREACTIVE
HIV-1 P24 ANTIGEN - HIV24: NONREACTIVE

## 2015-09-19 SURGERY — EXCISION, CYST, OVARY, LAPAROSCOPIC
Anesthesia: General | Laterality: Right

## 2015-09-19 MED ORDER — SUGAMMADEX SODIUM 200 MG/2ML IV SOLN
INTRAVENOUS | Status: AC
  Filled 2015-09-19: qty 2

## 2015-09-19 MED ORDER — LIDOCAINE HCL (CARDIAC) 20 MG/ML IV SOLN
INTRAVENOUS | Status: DC | PRN
  Administered 2015-09-19: 90 mg via INTRAVENOUS

## 2015-09-19 MED ORDER — HYDROMORPHONE HCL 1 MG/ML IJ SOLN
0.2500 mg | INTRAMUSCULAR | Status: DC | PRN
  Administered 2015-09-19 (×3): 0.5 mg via INTRAVENOUS
  Filled 2015-09-19: qty 1

## 2015-09-19 MED ORDER — FENTANYL CITRATE (PF) 100 MCG/2ML IJ SOLN
INTRAMUSCULAR | Status: AC
  Filled 2015-09-19: qty 2

## 2015-09-19 MED ORDER — PROPOFOL 500 MG/50ML IV EMUL
INTRAVENOUS | Status: AC
  Filled 2015-09-19: qty 50

## 2015-09-19 MED ORDER — ONDANSETRON HCL 4 MG/2ML IJ SOLN
INTRAMUSCULAR | Status: DC | PRN
  Administered 2015-09-19: 4 mg via INTRAVENOUS

## 2015-09-19 MED ORDER — OXYCODONE HCL 5 MG PO TABS
5.0000 mg | ORAL_TABLET | ORAL | Status: DC | PRN
  Filled 2015-09-19: qty 1

## 2015-09-19 MED ORDER — LACTATED RINGERS IV BOLUS (SEPSIS)
500.0000 mL | Freq: Once | INTRAVENOUS | Status: DC
  Filled 2015-09-19: qty 500

## 2015-09-19 MED ORDER — FENTANYL CITRATE (PF) 100 MCG/2ML IJ SOLN
INTRAMUSCULAR | Status: DC | PRN
  Administered 2015-09-19 (×7): 50 ug via INTRAVENOUS

## 2015-09-19 MED ORDER — ONDANSETRON HCL 4 MG/2ML IJ SOLN
INTRAMUSCULAR | Status: AC
  Filled 2015-09-19: qty 2

## 2015-09-19 MED ORDER — SODIUM CHLORIDE 0.9 % IR SOLN
Status: DC | PRN
  Administered 2015-09-19: 1000 mL

## 2015-09-19 MED ORDER — SUGAMMADEX SODIUM 200 MG/2ML IV SOLN
INTRAVENOUS | Status: DC | PRN
  Administered 2015-09-19: 200 mg via INTRAVENOUS

## 2015-09-19 MED ORDER — LACTATED RINGERS IV SOLN
INTRAVENOUS | Status: DC
  Administered 2015-09-19 (×3): via INTRAVENOUS
  Filled 2015-09-19: qty 1000

## 2015-09-19 MED ORDER — FENTANYL CITRATE (PF) 250 MCG/5ML IJ SOLN
INTRAMUSCULAR | Status: AC
  Filled 2015-09-19: qty 5

## 2015-09-19 MED ORDER — ROCURONIUM BROMIDE 10 MG/ML (PF) SYRINGE
PREFILLED_SYRINGE | INTRAVENOUS | Status: AC
  Filled 2015-09-19: qty 10

## 2015-09-19 MED ORDER — MIDAZOLAM HCL 5 MG/5ML IJ SOLN
INTRAMUSCULAR | Status: DC | PRN
  Administered 2015-09-19: 2 mg via INTRAVENOUS

## 2015-09-19 MED ORDER — DEXAMETHASONE SODIUM PHOSPHATE 10 MG/ML IJ SOLN
INTRAMUSCULAR | Status: AC
  Filled 2015-09-19: qty 1

## 2015-09-19 MED ORDER — OXYCODONE-ACETAMINOPHEN 5-325 MG PO TABS
ORAL_TABLET | ORAL | Status: AC
  Filled 2015-09-19: qty 1

## 2015-09-19 MED ORDER — HYDROMORPHONE HCL 1 MG/ML IJ SOLN
INTRAMUSCULAR | Status: AC
  Filled 2015-09-19: qty 1

## 2015-09-19 MED ORDER — HYDROMORPHONE HCL 1 MG/ML IJ SOLN
INTRAMUSCULAR | Status: DC | PRN
  Administered 2015-09-19: 0.5 mg via INTRAVENOUS

## 2015-09-19 MED ORDER — ROCURONIUM BROMIDE 100 MG/10ML IV SOLN
INTRAVENOUS | Status: DC | PRN
  Administered 2015-09-19: 5 mg via INTRAVENOUS
  Administered 2015-09-19: 45 mg via INTRAVENOUS

## 2015-09-19 MED ORDER — LIDOCAINE 2% (20 MG/ML) 5 ML SYRINGE
INTRAMUSCULAR | Status: AC
  Filled 2015-09-19: qty 5

## 2015-09-19 MED ORDER — BUPIVACAINE HCL (PF) 0.25 % IJ SOLN
INTRAMUSCULAR | Status: DC | PRN
Start: 2015-09-19 — End: 2015-09-19
  Administered 2015-09-19: 4 mL
  Administered 2015-09-19: 6 mL

## 2015-09-19 MED ORDER — DEXAMETHASONE SODIUM PHOSPHATE 4 MG/ML IJ SOLN
INTRAMUSCULAR | Status: DC | PRN
  Administered 2015-09-19: 10 mg via INTRAVENOUS

## 2015-09-19 MED ORDER — PROMETHAZINE HCL 25 MG/ML IJ SOLN
6.2500 mg | INTRAMUSCULAR | Status: DC | PRN
  Filled 2015-09-19: qty 1

## 2015-09-19 MED ORDER — PROPOFOL 10 MG/ML IV BOLUS
INTRAVENOUS | Status: DC | PRN
  Administered 2015-09-19: 200 mg via INTRAVENOUS

## 2015-09-19 MED ORDER — OXYCODONE-ACETAMINOPHEN 5-325 MG PO TABS
1.0000 | ORAL_TABLET | Freq: Once | ORAL | Status: AC
  Administered 2015-09-19: 1 via ORAL
  Filled 2015-09-19: qty 1

## 2015-09-19 MED ORDER — MIDAZOLAM HCL 2 MG/2ML IJ SOLN
INTRAMUSCULAR | Status: AC
  Filled 2015-09-19: qty 2

## 2015-09-19 MED ORDER — KETOROLAC TROMETHAMINE 30 MG/ML IJ SOLN
INTRAMUSCULAR | Status: DC | PRN
  Administered 2015-09-19: 30 mg via INTRAVENOUS

## 2015-09-19 MED ORDER — OXYCODONE-ACETAMINOPHEN 5-325 MG PO TABS: 1.0000 | ORAL_TABLET | ORAL | 0 refills | Status: DC | PRN

## 2015-09-19 MED FILL — OXYCODONE/APAP 5/325MG: 5-325 | 3 days supply | Qty: 30 | Fill #0

## 2015-09-19 SURGICAL SUPPLY — 43 items
APPLICATOR ARISTA FLEXITIP XL (MISCELLANEOUS) ×3 IMPLANT
BLADE SURG 11 STRL SS (BLADE) ×3 IMPLANT
CABLE HIGH FREQUENCY MONO STRZ (ELECTRODE) IMPLANT
CATH ROBINSON RED A/P 14FR (CATHETERS) ×3 IMPLANT
CATH ROBINSON RED A/P 16FR (CATHETERS) IMPLANT
CHLORAPREP W/TINT 26ML (MISCELLANEOUS) ×3 IMPLANT
COVER MAYO STAND STRL (DRAPES) ×3 IMPLANT
DRAPE UNDERBUTTOCKS STRL (DRAPE) ×3 IMPLANT
DRSG OPSITE POSTOP 3X4 (GAUZE/BANDAGES/DRESSINGS) IMPLANT
GLOVE BIO SURGEON STRL SZ 6.5 (GLOVE) ×2 IMPLANT
GLOVE BIO SURGEONS STRL SZ 6.5 (GLOVE) ×1
GLOVE BIOGEL PI IND STRL 6.5 (GLOVE) ×1 IMPLANT
GLOVE BIOGEL PI IND STRL 7.0 (GLOVE) ×2 IMPLANT
GLOVE BIOGEL PI INDICATOR 6.5 (GLOVE) ×2
GLOVE BIOGEL PI INDICATOR 7.0 (GLOVE) ×4
GOWN STRL REUS W/TWL LRG LVL3 (GOWN DISPOSABLE) ×6 IMPLANT
HEMOSTAT ARISTA ABSORB 3G PWDR (MISCELLANEOUS) ×3 IMPLANT
IV NS 1000ML (IV SOLUTION) ×2
IV NS 1000ML BAXH (IV SOLUTION) ×1 IMPLANT
LIGASURE BLUNT 5MM 37CM (INSTRUMENTS) IMPLANT
LIQUID BAND (GAUZE/BANDAGES/DRESSINGS) IMPLANT
NEEDLE HYPO 25X1 1.5 SAFETY (NEEDLE) ×3 IMPLANT
NEEDLE INSUFFLATION 120MM (ENDOMECHANICALS) ×3 IMPLANT
NS IRRIG 500ML POUR BTL (IV SOLUTION) ×3 IMPLANT
PACK BASIN DAY SURGERY FS (CUSTOM PROCEDURE TRAY) ×3 IMPLANT
PACK LAPAROSCOPY II (CUSTOM PROCEDURE TRAY) ×3 IMPLANT
PAD OB MATERNITY 4.3X12.25 (PERSONAL CARE ITEMS) ×3 IMPLANT
PIPELLE ×3 IMPLANT
POUCH SPECIMEN RETRIEVAL 10MM (ENDOMECHANICALS) IMPLANT
SCISSORS LAP 5X35 DISP (ENDOMECHANICALS) IMPLANT
SCISSORS LAP 5X45 EPIX DISP (ENDOMECHANICALS) ×3 IMPLANT
SET IRRIG TUBING LAPAROSCOPIC (IRRIGATION / IRRIGATOR) ×3 IMPLANT
SOLUTION ANTI FOG 6CC (MISCELLANEOUS) ×3 IMPLANT
SUT VICRYL 0 UR6 27IN ABS (SUTURE) ×3 IMPLANT
SUT VICRYL RAPIDE 4/0 PS 2 (SUTURE) ×6 IMPLANT
SYR 50ML LL SCALE MARK (SYRINGE) ×3 IMPLANT
SYR CONTROL 10ML LL (SYRINGE) ×3 IMPLANT
TOWEL OR 17X24 6PK STRL BLUE (TOWEL DISPOSABLE) ×6 IMPLANT
TRAY DSU PREP LF (CUSTOM PROCEDURE TRAY) ×3 IMPLANT
TROCAR XCEL NON-BLD 11X100MML (ENDOMECHANICALS) ×3 IMPLANT
TROCAR XCEL NON-BLD 5MMX100MML (ENDOMECHANICALS) ×9 IMPLANT
TUBING INSUFFLATION W/FILTER (TUBING) ×3 IMPLANT
WARMER LAPAROSCOPE (MISCELLANEOUS) ×3 IMPLANT

## 2015-09-19 NOTE — Anesthesia Postprocedure Evaluation (Signed)
Anesthesia Post Note  Patient: Laura Simmons  Procedure(s) Performed: Procedure(s) (LRB): LAPAROSCOPIC OVARIAN CYSTECTOMY; CAUTERIZATION OF ENDOMETRIAL LESIONS; ENDOMETRIAL CURRETINGS (Right)  Patient location during evaluation: PACU Anesthesia Type: General Level of consciousness: awake and alert Pain control: remains tearful . still c/o pain. Ha had 2mg  dilaudid. Vital Signs Assessment: post-procedure vital signs reviewed and stable Respiratory status: spontaneous breathing, nonlabored ventilation, respiratory function stable and patient connected to nasal cannula oxygen Cardiovascular status: blood pressure returned to baseline and stable Postop Assessment: no signs of nausea or vomiting Anesthetic complications: no    Last Vitals:  Vitals:   09/19/15 1030 09/19/15 1045  BP:  116/79  Pulse: 77 68  Resp: 10 (!) 9  Temp:      Last Pain:  Vitals:   09/19/15 1100  TempSrc:   PainSc: 8                  Nickisha Hum,JAMES TERRILL

## 2015-09-19 NOTE — Anesthesia Procedure Notes (Signed)
Procedure Name: Intubation Date/Time: 09/19/2015 7:38 AM Performed by: Wanita Chamberlain Pre-anesthesia Checklist: Patient identified, Timeout performed, Emergency Drugs available, Suction available and Patient being monitored Patient Re-evaluated:Patient Re-evaluated prior to inductionOxygen Delivery Method: Circle system utilized Preoxygenation: Pre-oxygenation with 100% oxygen Intubation Type: IV induction Ventilation: Mask ventilation without difficulty Laryngoscope Size: Mac and 3 Grade View: Grade I Tube size: 7.0 mm Number of attempts: 1 Airway Equipment and Method: Stylet Placement Confirmation: ETT inserted through vocal cords under direct vision,  positive ETCO2 and breath sounds checked- equal and bilateral Secured at: 21 cm Tube secured with: Tape Dental Injury: Teeth and Oropharynx as per pre-operative assessment

## 2015-09-19 NOTE — Anesthesia Preprocedure Evaluation (Addendum)
Anesthesia Evaluation  Patient identified by MRN, date of birth, ID band  History of Anesthesia Complications (+) Emergence DeliriumNegative for: history of anesthetic complications  Airway Mallampati: I  TM Distance: >3 FB Neck ROM: Full    Dental  (+) Teeth Intact, Dental Advisory Given,    Pulmonary Current Smoker,    breath sounds clear to auscultation       Cardiovascular negative cardio ROS   Rhythm:Regular Rate:Normal     Neuro/Psych  Headaches, PSYCHIATRIC DISORDERS Anxiety Bipolar Disorder Recent loss of former fiance to suicide.   GI/Hepatic Neg liver ROS, GERD  ,  Endo/Other  negative endocrine ROS  Renal/GU negative Renal ROS     Musculoskeletal Chronic pain syn   Abdominal   Peds  Hematology negative hematology ROS (+)   Anesthesia Other Findings   Reproductive/Obstetrics                         Anesthesia Physical Anesthesia Plan  ASA: II  Anesthesia Plan: General   Post-op Pain Management:    Induction: Intravenous  Airway Management Planned: Oral ETT  Additional Equipment:   Intra-op Plan:   Post-operative Plan: Extubation in OR  Informed Consent: I have reviewed the patients History and Physical, chart, labs and discussed the procedure including the risks, benefits and alternatives for the proposed anesthesia with the patient or authorized representative who has indicated his/her understanding and acceptance.   Dental advisory given  Plan Discussed with:   Anesthesia Plan Comments:         Anesthesia Quick Evaluation

## 2015-09-19 NOTE — Transfer of Care (Signed)
Immediate Anesthesia Transfer of Care Note  Patient: Quenten Raven  Procedure(s) Performed: Procedure(s): LAPAROSCOPIC OVARIAN CYSTECTOMY; CAUTERIZATION OF ENDOMETRIAL LESIONS; ENDOMETRIAL CURRETINGS (Right)  Patient Location: PACU  Anesthesia Type:General  Level of Consciousness: awake, alert , oriented and patient cooperative  Airway & Oxygen Therapy: Patient Spontanous Breathing and Patient connected to nasal cannula oxygen  Post-op Assessment: Report given to RN and Post -op Vital signs reviewed and stable  Post vital signs: Reviewed and stable  Last Vitals:  Vitals:   09/19/15 0602 09/19/15 0933  BP: 110/67 (P) 117/82  Pulse: 71 (P) 73  Resp: 16 (P) 12  Temp: 37.1 C (P) 36.5 C    Last Pain:  Vitals:   09/19/15 0616  TempSrc:   PainSc: 8       Patients Stated Pain Goal: 6 ([id number])  Complications: No apparent anesthesia complications

## 2015-09-19 NOTE — Brief Op Note (Signed)
09/19/2015  9:25 AM  PATIENT:  Quenten Raven  40 y.o. female  PRE-OPERATIVE DIAGNOSIS:  right ovarian cyst  POST-OPERATIVE DIAGNOSIS:  right ovarian cyst, endometriosis  PROCEDURE:  Procedure(s): LAPAROSCOPIC OVARIAN CYSTECTOMY; CAUTERIZATION OF ENDOMETRIAL LESIONS; ENDOMETRIAL CURRETINGS (Right)  SURGEON:  Surgeon(s) and Role:    * [name], MD - Primary    * Arvella Nigh, MD - Assisting  PHYSICIAN ASSISTANT:   ASSISTANTS: Arvella Nigh MD ANESTHESIA:   general  EBL:  Total I/O In: 1300 [I.V.:1300] Out: 20 [Blood:20]  BLOOD ADMINISTERED:none  DRAINS: none   LOCAL MEDICATIONS USED:  MARCAINE     SPECIMEN:  Source of Specimen:  R ovarian cyst, Endometrial curretings  DISPOSITION OF SPECIMEN:  PATHOLOGY  COUNTS:  YES  TOURNIQUET:  * No tourniquets in log *  DICTATION: .Note written in EPIC  PLAN OF CARE: Discharge to home after PACU  PATIENT DISPOSITION:  PACU - hemodynamically stable.   Delay start of Pharmacological VTE agent (>24hrs) due to surgical blood loss or risk of bleeding: not applicable

## 2015-09-19 NOTE — Anesthesia Postprocedure Evaluation (Signed)
Anesthesia Post Note  Patient: Laura Simmons  Procedure(s) Performed: Procedure(s) (LRB): LAPAROSCOPIC OVARIAN CYSTECTOMY; CAUTERIZATION OF ENDOMETRIAL LESIONS; ENDOMETRIAL CURRETINGS (Right)  Anesthesia Type: Combined Spinal/Epidural    Last Vitals:  Vitals:   09/19/15 0602 09/19/15 0933  BP: 110/67 (P) 117/82  Pulse: 71 (P) 73  Resp: 16 (P) 12  Temp: 37.1 C (P) 36.5 C    Last Pain:  Vitals:   09/19/15 0616  TempSrc:   PainSc: 8                  [name] F

## 2015-09-19 NOTE — Op Note (Addendum)
DATE OF OPERATION:  09/19/15 PREOPERATIVE DIAGNOSES: 1.  R ovarian cyst 2.  Pelvic pain 3. Abnormal uterine bleeding  POSTOPERATIVE DIAGNOSIS: 1. Right ovarian cyst, suspected to be endometrioma 2. Endometriotic lesions 3. Abnormal uterine bleeding  OPERATION PERFORMED: Laparoscopic right ovarian cystectomy, fulguration of endometriotic implants, endometrial biopsy  SURGEON:  Lucillie Garfinkel, MD ASSISTANT: Arvella Nigh, MD  ANESTHESIA:  General.  OPERATIVE FINDINGS: 1.  Bimanual examination revealed an anteverted 6-week size uterus with a right adnexal mass palpated that was detected to be mobile. 2.  Right ovarian cyst filled with chocolate- like fluid c/w endometriosis 3.  Normal-appearing uterus, tubes and liver. L ovary with powder burn lesions c/w endometriosis 4. Several endometriotic implants in posterior cul-de-sac, and L and R broad ligament - likely stage 3 endometriosis   SPECIMEN SENT: Cyst wall and right ovarian cyst. Endometrial curretings.   ESTIMATED BLOOD LOSS: 20 mL.  IV FLUIDS: 1300cc crystalloid  URINE OUTPUT: 200cc  INDICATIONS FOR OPERATION: The patient is a 40year-old G0 admitted with a diagnosis of R simple ovarian cyst and pelvic pain x 2-4 weeks. She also noted AUB x several months. Medical management was recommended but patient opted for surgical management. Decision made to perform EMB at same time and defer it in the office given she would already be under anesthesia. All risks and benefits of the surgery had been discussed with the patient in clinic. All her questions were answered. The patient was taken to the operating room in stable condition.  DESCRIPTION OF OPERATION: The patient was taken to the OR where she was placed under general anesthesia without difficulty. She was then prepped and draped in the usual sterile fashion and placed in the dorsal lithotomy position. A preoperative bimanual examination revealed findings as above. Attention was  turned to the vagina, where a speculum was placed in the vagina and the anterior lip of the cervix was grasped using a single-toothed tenaculum. The manipulator was then inserted through the cervix without difficulty and the weighted speculum was removed. The bladder had been drained with a  Catheter. Attention was then turned to the abdomen where an approximately 11 mm skin incision was made at the base of the umbilicus. Entry into the peritoneum and insufflation was performed with a Veress needle to 3 liters of Co2 gas, drawing back on a syringe to ensure no blood or bowel contents were aspirated. Veress was removed. At this time, the 10 mm trocar was placed through the umbilical incision and confirmation of intraabdominal placement was confirmed under direct visualization by the camera. At this time, the second incision was made using a 5 mm incision and 5 mm trocar in the left lower quadrant and the third trocar was inserted also using a 5 mm incision in the right lower quadrant. Lastly, a suprapubic port 3cm above the pubic symphysis. Examination of the pelvis revealed findings as above. At this time, the left ovarian cyst appeared to be normal except for a ~1cm deep appearing powder burn lesion c/w an endometriotic lesion. Attention was turned to the right ovary which was mobilized out of the pelvis. A cyst on the right was noted. A  An approximately 3 cm incision was made along the ovary using electrocautery. At this point, it was dissected initially using blunt dissection. Entry into the cyst was created using the electrocautery. Suction was used to drain brown endometriotic fluid at this time from the cyst. At this time, the cyst wall was identified and was removed using blunt  dissection with countertraction. Pieces of cyst wall were removed through the 61mm port.The ovarian bed was then irrigated and dried. There was slight oozing noted near the edge of the ovary, obtained hemostasis using electrocautery.  The pelvis was then irrigated, and arista was used for R ovarian hemostasis which was complete. Attention was then turned to the endometriotic implants which were cauterized using Kleppinger bipolar graspers. At this time, the pelvis was again copiously irrigated and dried. Hemostasis was assured. At this time, all instruments were removed under visualization at low pressure. The 10 mm umbilical incision was closed using  One 0' Vicryl sutures and the skin was closed using a running stitch of 4-0 vicryl suture. Lateral and suprapubic ports were closed with dermabond.  Attention was then turned to vagina were a the manipulator was removed. A speculum was inserted. The cervix was dilated with the smallest pratt dilator and an endometrial curette was passed to the fundus. Endometrial curetting were obtained.All instruments were removed from the uterus, cervix, and vagina. No bleeding from cervical os or tenaculum site. Once all instruments were removed, the patient was cleaned. The patient tolerated the procedure well without complications and was taken to the recovery room in stable condition.   Lucillie Garfinkel MD

## 2015-09-19 NOTE — Discharge Instructions (Signed)
HOME CARE INSTRUCTIONS - LAPAROSCOPY  Wound Care: The bandaids or dressing which are placed over the skin openings may be removed the day after surgery. The incision should be kept clean and dry. The stitches do not need to be removed. Should the incision become sore, red, and swollen after the first week, check with your doctor.  Personal Hygiene: Shower the day after your procedure. Always wipe from front to back after elimination.   Activity: Do not drive or operate any equipment today. The effects of the anesthesia are still present and drowsiness may result. Rest today, not necessarily flat bed rest, just take it easy. You may resume your normal activity in one to three days or as instructed by your physician.  Sexual Activity: You resume sexual activity as indicated by your physician_________. If your laparoscopy was for a sterilization ( tubes tied ), continue current method of birth control until after your next period or ask for specific instructions from your doctor.  Diet: Eat a light diet as desired this evening. You may resume a regular diet tomorrow.  Return to Work: Two to three days or as indicated by your doctor.  Expectations After Surgery: Your surgery will cause vaginal drainage or spotting which may continue for 2-3 days. Mild abdominal discomfort or tenderness is not unusual and some shoulder pain may also be noted which can be relieved by lying flat in pain.  Call Your Doctor If these Occur:  Persistent or heavy bleeding at incision site       Redness or swelling around incision       Elevation of temperature greater than 100 degrees F  Call for follow-up appointment _____________.Call your surgeon if you experience:   1.  Fever over 101.0. 2.  Inability to urinate. 3.  Nausea and/or vomiting. 4.  Extreme swelling or bruising at the surgical site. 5.  Continued bleeding from the incision. 6.  Increased pain, redness or drainage from the incision. 7.  Problems  related to your pain medication. 8.  Any problems and/or concerns  Post Anesthesia Home Care Instructions  Activity: Get plenty of rest for the remainder of the day. A responsible adult should stay with you for 24 hours following the procedure.  For the next 24 hours, DO NOT: -Drive a car -Paediatric nurse -Drink alcoholic beverages -Take any medication unless instructed by your physician -Make any legal decisions or sign important papers.  Meals: Start with liquid foods such as gelatin or soup. Progress to regular foods as tolerated. Avoid greasy, spicy, heavy foods. If nausea and/or vomiting occur, drink only clear liquids until the nausea and/or vomiting subsides. Call your physician if vomiting continues.  Special Instructions/Symptoms: Your throat may feel dry or sore from the anesthesia or the breathing tube placed in your throat during surgery. If this causes discomfort, gargle with warm salt water. The discomfort should disappear within 24 hours.  If you had a scopolamine patch placed behind your ear for the management of post- operative nausea and/or vomiting:  1. The medication in the patch is effective for 72 hours, after which it should be removed.  Wrap patch in a tissue and discard in the trash. Wash hands thoroughly with soap and water. 2. You may remove the patch earlier than 72 hours if you experience unpleasant side effects which may include dry mouth, dizziness or visual disturbances. 3. Avoid touching the patch. Wash your hands with soap and water after contact with the patch.

## 2015-09-20 ENCOUNTER — Encounter (HOSPITAL_BASED_OUTPATIENT_CLINIC_OR_DEPARTMENT_OTHER): Payer: Self-pay | Admitting: Obstetrics and Gynecology

## 2015-09-23 ENCOUNTER — Encounter: Payer: Self-pay | Admitting: Family Medicine

## 2015-09-24 ENCOUNTER — Ambulatory Visit: Payer: BLUE CROSS/BLUE SHIELD | Admitting: Gastroenterology

## 2015-10-01 NOTE — Telephone Encounter (Addendum)
Pt would like to see if you would refill Xanax and Ritalin.   Pharm:  [facility] on [address].  Pt state that she is taking 1/2 pill and only have 1 pill left as of today and is having withdrawals and has also not heard any thing from the psychologist.

## 2015-10-02 ENCOUNTER — Other Ambulatory Visit: Payer: Self-pay

## 2015-10-02 DIAGNOSIS — F988 Other specified behavioral and emotional disorders with onset usually occurring in childhood and adolescence: Secondary | ICD-10-CM

## 2015-10-02 MED ORDER — METHYLPHENIDATE HCL 10 MG PO TABS: ORAL_TABLET | ORAL | 0 refills | Status: DC

## 2015-10-02 MED ORDER — ALPRAZOLAM 1 MG PO TABS: ORAL_TABLET | ORAL | 0 refills | Status: DC

## 2015-10-03 DIAGNOSIS — Z30430 Encounter for insertion of intrauterine contraceptive device: Secondary | ICD-10-CM | POA: Diagnosis not present

## 2015-10-07 ENCOUNTER — Other Ambulatory Visit (INDEPENDENT_AMBULATORY_CARE_PROVIDER_SITE_OTHER): Payer: BLUE CROSS/BLUE SHIELD

## 2015-10-07 DIAGNOSIS — Z0000 Encounter for general adult medical examination without abnormal findings: Secondary | ICD-10-CM

## 2015-10-07 LAB — CBC WITH DIFFERENTIAL/PLATELET
Basophils Absolute: 0.1 10*3/uL (ref 0.0–0.1)
Basophils Relative: 0.5 % (ref 0.0–3.0)
EOS ABS: 0.2 10*3/uL (ref 0.0–0.7)
EOS PCT: 1.6 % (ref 0.0–5.0)
HCT: 41.8 % (ref 36.0–46.0)
HEMOGLOBIN: 14.4 g/dL (ref 12.0–15.0)
Lymphocytes Relative: 32.1 % (ref 12.0–46.0)
Lymphs Abs: 3.3 10*3/uL (ref 0.7–4.0)
MCHC: 34.4 g/dL (ref 30.0–36.0)
MCV: 94.7 fl (ref 78.0–100.0)
MONO ABS: 0.6 10*3/uL (ref 0.1–1.0)
Monocytes Relative: 5.9 % (ref 3.0–12.0)
Neutro Abs: 6.2 10*3/uL (ref 1.4–7.7)
Neutrophils Relative %: 59.9 % (ref 43.0–77.0)
Platelets: 517 10*3/uL — ABNORMAL HIGH (ref 150.0–400.0)
RBC: 4.42 Mil/uL (ref 3.87–5.11)
RDW: 13.1 % (ref 11.5–15.5)
WBC: 10.3 10*3/uL (ref 4.0–10.5)

## 2015-10-07 LAB — HEPATIC FUNCTION PANEL
ALK PHOS: 47 U/L (ref 39–117)
ALT: 11 U/L (ref 0–35)
AST: 11 U/L (ref 0–37)
Albumin: 4.3 g/dL (ref 3.5–5.2)
BILIRUBIN DIRECT: 0 mg/dL (ref 0.0–0.3)
BILIRUBIN TOTAL: 0.2 mg/dL (ref 0.2–1.2)
Total Protein: 7.2 g/dL (ref 6.0–8.3)

## 2015-10-07 LAB — BASIC METABOLIC PANEL
BUN: 17 mg/dL (ref 6–23)
CO2: 21 mEq/L (ref 19–32)
CREATININE: 1.06 mg/dL (ref 0.40–1.20)
Calcium: 9.3 mg/dL (ref 8.4–10.5)
Chloride: 109 mEq/L (ref 96–112)
GFR: 60.72 mL/min (ref >60.00)
Glucose, Bld: 68 mg/dL — ABNORMAL LOW (ref 70–99)
POTASSIUM: 4 meq/L (ref 3.5–5.1)
Sodium: 141 mEq/L (ref 135–145)

## 2015-10-07 LAB — POC URINALSYSI DIPSTICK (AUTOMATED)
Bilirubin, UA: NEGATIVE
Blood, UA: NEGATIVE
Glucose, UA: NEGATIVE
Leukocytes, UA: NEGATIVE
Nitrite, UA: NEGATIVE
PH UA: 5.5
SPEC GRAV UA: 1.025
Urobilinogen, UA: 0.2

## 2015-10-07 LAB — LIPID PANEL
CHOL/HDL RATIO: 5
Cholesterol: 274 mg/dL — ABNORMAL HIGH (ref 0–200)
HDL: 50.9 mg/dL (ref >39.00)
LDL CALC: 204 mg/dL — AB (ref 0–99)
NonHDL: 222.75
Triglycerides: 95 mg/dL (ref 0.0–149.0)
VLDL: 19 mg/dL (ref 0.0–40.0)

## 2015-10-07 LAB — TSH: TSH: 1.28 u[IU]/mL (ref 0.35–4.50)

## 2015-10-14 DIAGNOSIS — Z30430 Encounter for insertion of intrauterine contraceptive device: Secondary | ICD-10-CM | POA: Diagnosis not present

## 2015-10-15 ENCOUNTER — Encounter: Payer: Self-pay | Admitting: Obstetrics and Gynecology

## 2015-10-15 NOTE — H&P (Signed)
Laura Simmons is an 40 y.o. female. She is here today for Mirena IUD insertion under US guidance and MAC anesthesia. She recently underwent D&C, LSC with R ovarian cystectomy that was notable for multiple endometriotic lesions that were cauterized. She was extensively counseled in clinic regarding control of endometriosis (painful, irregular periods) an dpt opted for Mirena IUD. Attempt at placement was done in office and she did not tolerate it. Second attempt under TVUS guidance and pt still unable to tolerate it. Some cervical stenosis was noted - perhaps some scarring leftover from [facility] one month ago. She opts for placement of IUD under MAC and US guidance.   Pertinent Gynecological History: Menses: regular until recently as above Bleeding: dysfunctional uterine bleeding Contraception: abstinence Sexually transmitted diseases: no past history Previous GYN Procedures: h/o abnl pap, unsure re: procedures  Last mammogram: normal Date: 08/2015 Last pap: normal Date: 2017 OB History: G0              Menstrual History: Patient's last menstrual period was 08/10/2015 (exact date).    Past Medical History:  Diagnosis Date  . ADHD (attention deficit hyperactivity disorder)   . Agoraphobia   . Bipolar 1 disorder ([place])   . Chronic pain   . Endometriosis determined by laparoscopy   . GERD (gastroesophageal reflux disease)   . Migraine   . Personality disorder   . Right adrenal mass ([place])    per ct 09-06-2015  . Right ovarian cyst   . Vaginal Pap smear, abnormal     Past Surgical History:  Procedure Laterality Date  . COLONOSCOPY  2012  . LAPAROSCOPIC OVARIAN CYSTECTOMY Right 09/19/2015   Procedure: LAPAROSCOPIC OVARIAN CYSTECTOMY; CAUTERIZATION OF ENDOMETRIAL LESIONS; ENDOMETRIAL CURRETINGS;  Surgeon: [name], MD;  Location: [place];  Service: Gynecology;  Laterality: Right;    Family History  Problem Relation Age of Onset  . Migraines Mother   .  Diabetes Maternal Grandmother   . Stroke Maternal Grandfather   . Heart attack Paternal Grandfather     Social History:  reports that she has been smoking Cigarettes.  She has a 20.00 pack-year smoking history. She has never used smokeless tobacco. She reports that she drinks alcohol. She reports that she does not use drugs.  Allergies:  Allergies  Allergen Reactions  . Sumatriptan Succinate Anaphylaxis and Other (See Comments)    ALL TRIPTAN MEDS  . Triptans Anaphylaxis and Other (See Comments)    Pt states throat feels as if it is closing.  . Compazine [Prochlorperazine] Other (See Comments)    lockjaw  . Ketamine Other (See Comments)    "Pt. Reports she never wants again." major hallucinations  . Reglan [Metoclopramide] Other (See Comments)    "crawling out of my skin"    No prescriptions prior to admission.    ROS  Last menstrual period 08/10/2015. Physical Exam  Gen: NAD Pulm: NWOB b/l Cv: Reg rate Abd: soft, non-distended  No results found for this or any previous visit (from the past 24 hour(s)).  No results found.  Assessment/Plan: 40 yo w/h/o endometriosis, s/p LSC csytectomy presenting for Mirena IUD insertion under MAC and US guidance, unable to tolerate placement in the office. Patient consented and risks discussed including infection, bleeding, damage to uterus and/or surrounding structures, failure of placement and the need for additional procedures. Patient agrees to proceed and all questions were answered.    [name] 10/15/2015, 3:16 PM

## 2015-10-18 ENCOUNTER — Ambulatory Visit (INDEPENDENT_AMBULATORY_CARE_PROVIDER_SITE_OTHER): Payer: BLUE CROSS/BLUE SHIELD | Admitting: Family Medicine

## 2015-10-18 ENCOUNTER — Encounter: Payer: Self-pay | Admitting: Family Medicine

## 2015-10-18 VITALS — BP 118/80 | HR 92 | Temp 98.5°F | Resp 12 | Ht 65.0 in | Wt 129.0 lb

## 2015-10-18 DIAGNOSIS — F3130 Bipolar disorder, current episode depressed, mild or moderate severity, unspecified: Secondary | ICD-10-CM

## 2015-10-18 DIAGNOSIS — R1030 Lower abdominal pain, unspecified: Secondary | ICD-10-CM | POA: Diagnosis not present

## 2015-10-18 DIAGNOSIS — E785 Hyperlipidemia, unspecified: Secondary | ICD-10-CM | POA: Diagnosis not present

## 2015-10-18 DIAGNOSIS — G43709 Chronic migraine without aura, not intractable, without status migrainosus: Secondary | ICD-10-CM | POA: Diagnosis not present

## 2015-10-18 DIAGNOSIS — Z0000 Encounter for general adult medical examination without abnormal findings: Secondary | ICD-10-CM | POA: Diagnosis not present

## 2015-10-18 DIAGNOSIS — F319 Bipolar disorder, unspecified: Secondary | ICD-10-CM | POA: Insufficient documentation

## 2015-10-18 MED ORDER — ATORVASTATIN CALCIUM 20 MG PO TABS: 20.0000 mg | ORAL_TABLET | Freq: Every day | ORAL | 1 refills | Status: DC

## 2015-10-18 MED ORDER — BUTALBITAL-ASPIRIN-CAFFEINE 50-325-40 MG PO CAPS: 1.0000 | ORAL_CAPSULE | Freq: Two times a day (BID) | ORAL | 0 refills | Status: DC | PRN

## 2015-10-18 NOTE — Patient Instructions (Addendum)
Your procedure is scheduled on:  Wednesday, Oct. 4, 2017  Enter through the [employer] of [facility] at:  6:00 AM  Pick up the phone at the desk and dial [phone].  Call this number if you have problems the morning of surgery: [phone].  Remember: Do NOT eat food or drink after:  Midnight Tuesday  Take these medicines the morning of surgery with a SIP OF WATER:  Ritalin, Xanax if needed  Do Not smoke the day of surgery  Do NOT wear jewelry (body piercing), metal hair clips/bobby pins, make-up, or nail polish. Do NOT wear lotions, powders, or perfumes.  You may wear deodorant. Do NOT shave for 48 hours prior to surgery. Do NOT bring valuables to the hospital. Contacts, dentures, or bridgework may not be worn into surgery.  Have a responsible adult drive you home and stay with you for 24 hours after your procedure

## 2015-10-18 NOTE — Progress Notes (Signed)
HPI:   [name] is a 40 y.o. female, who is here today for her routine physical.   She exercises regularly and tries to follow a healthy diet.  She lives with parents.  Pap smear 2017, she follows with gynecologists.  Mammogram 2017   FHx for gynecologic or colon cancer negative She had lab work recently.  Lab Results  Component Value Date   CHOL 274 (H) 10/07/2015   HDL 50.90 10/07/2015   LDLCALC 204 (H) 10/07/2015   TRIG 95.0 10/07/2015   CHOLHDL 5 10/07/2015   Lab Results  Component Value Date   WBC 10.3 10/07/2015   HGB 14.4 10/07/2015   HCT 41.8 10/07/2015   MCV 94.7 10/07/2015   PLT 517.0 (H) 10/07/2015   Lab Results  Component Value Date   ALT 11 10/07/2015   AST 11 10/07/2015   ALKPHOS 47 10/07/2015   BILITOT 0.2 10/07/2015   Lab Results  Component Value Date   CREATININE 1.06 10/07/2015   BUN 17 10/07/2015   NA 141 10/07/2015   K 4.0 10/07/2015   CL 109 10/07/2015   CO2 21 10/07/2015   Lab Results  Component Value Date   TSH 1.28 10/07/2015    She has hx of IBS, bipolar disorder, depression, ADHD, and anxiety. + Smoker. I have been filling her medications, last Rx given recently, she has not arranged appt with psychiatrists, states that she called and was told the next one available was in 2 months, she did not set up appt.   She feels like her depression is getting worse, she denies any suicidal thought. She attributes exacerbation to recent health issues, pelvic pain,which has been attributed to endometriosis. Currently she is following with gynecologists, about a month ago she underwent a surgical procedure. Also frustrated because 2 unsuccessful attempts for IUD mirena placement. Hoping that mirena may help with pain, has appt for a 3rd attempt but will be done under sedation.  She is on Xanax 1 mg 1/2-1 tab bid as needed. She tells me that she has tried many medications, she is not interested in trying another one, states  that she has a long list of medications she has tried and has not tolerated well.  She is also on Ritalin 10 mg tid for ADHD.  She was following with psychiatrist in [place] and was following monthly. She moved with parents in 06/2015.   She has history of migraine headaches, she is requesting a refill on Fioricet , which she has been taking 2-3 times per week as needed.  Dx at age 41. She did not tolerated Imitrex or other medications for prophylaxis.Preceeded by visual aura and associated with photophobia and nausea.   Also tension like headache, occipital, occasional cervical pain.  Takes Flexeril as needed.   According to patient she is not longer on opioid medication, he was actually prescription to take after gynecologic surgery.     Review of Systems  Constitutional: Positive for fatigue. Negative for appetite change, chills, fever and unexpected weight change.  HENT: Negative for dental problem, facial swelling, hearing loss, mouth sores, trouble swallowing and voice change.   Eyes: Negative for photophobia, pain and visual disturbance.  Respiratory: Negative for cough, shortness of breath and wheezing.   Cardiovascular: Negative for chest pain and leg swelling.  Gastrointestinal: Positive for abdominal pain. Negative for blood in stool, nausea and vomiting.       No changes in bowel habits.  Endocrine: Negative for cold  intolerance, heat intolerance, polydipsia, polyphagia and polyuria.  Genitourinary: Positive for pelvic pain. Negative for decreased urine volume, difficulty urinating, dysuria and hematuria.  Musculoskeletal: Positive for back pain. Negative for arthralgias, joint swelling and myalgias.  Skin: Negative for color change and rash.  Neurological: Positive for headaches. Negative for seizures, syncope, weakness and numbness.  Hematological: Negative for adenopathy. Does not bruise/bleed easily.  Psychiatric/Behavioral: Positive for sleep disturbance.  Negative for confusion, hallucinations and suicidal ideas. The patient is nervous/anxious.   All other systems reviewed and are negative.     Current Outpatient Prescriptions on File Prior to Visit  Medication Sig Dispense Refill  . ALPRAZolam (XANAX) 1 MG tablet Take 1/2-1 tablet by mouth twice a day as needed. (Patient taking differently: Take 0.5-1 mg by mouth 2 (two) times daily as needed for anxiety. Take 1/2-1 tablet by mouth twice a day as needed.) 60 tablet 0  . Aspirin-Salicylamide-Caffeine (BC HEADACHE POWDER PO) Take by mouth as needed.    . butalbital-acetaminophen-caffeine (FIORICET WITH CODEINE) 50-325-40-30 MG capsule Take 1 capsule by mouth every 12 (twelve) hours as needed for headache (No more than 3 times per week.). 30 capsule 0  . calcium carbonate (TUMS) 500 MG chewable tablet Chew 1 tablet by mouth as needed for indigestion or heartburn.    . cyclobenzaprine (FLEXERIL) 5 MG tablet Take 1 tablet as needed at bedtime for severe headache. 20 tablet 3  . dimenhyDRINATE (DRAMAMINE) 50 MG tablet Take 50 mg by mouth at bedtime as needed for nausea.    Marland Kitchen ibuprofen (ADVIL,MOTRIN) 200 MG tablet Take 800 mg by mouth every 8 (eight) hours as needed for headache or mild pain.    . methylphenidate (RITALIN) 10 MG tablet Take 10 mg by mouth 3 times daily. 90 tablet 0  . ondansetron (ZOFRAN ODT) 4 MG disintegrating tablet Take 1 tablet (4 mg total) by mouth every 8 (eight) hours as needed for nausea. 6 tablet 0   No current facility-administered medications on file prior to visit.      Past Medical History:  Diagnosis Date  . ADHD (attention deficit hyperactivity disorder)   . Agoraphobia   . Bipolar 1 disorder ([place])   . Chronic pain   . Endometriosis determined by laparoscopy   . GERD (gastroesophageal reflux disease)   . Migraine   . Personality disorder   . Right adrenal mass ([place])    per ct 09-06-2015  . Right ovarian cyst   . Vaginal Pap smear, abnormal     Allergies   Allergen Reactions  . Sumatriptan Succinate Anaphylaxis and Other (See Comments)    ALL TRIPTAN MEDS  . Triptans Anaphylaxis and Other (See Comments)    Pt states throat feels as if it is closing.  . Compazine [Prochlorperazine] Other (See Comments)    lockjaw  . Ketamine Other (See Comments)    "Pt. Reports she never wants again." major hallucinations  . Reglan [Metoclopramide] Other (See Comments)    "crawling out of my skin"    Family History  Problem Relation Age of Onset  . Migraines Mother   . Diabetes Maternal Grandmother   . Stroke Maternal Grandfather   . Heart attack Paternal Grandfather     Social History   Social History  . Marital status: Single    Spouse name: N/A  . Number of children: N/A  . Years of education: N/A   Social History Main Topics  . Smoking status: Current Every Day Smoker    Packs/day: 1.00  Years: 20.00    Types: Cigarettes  . Smokeless tobacco: Never Used  . Alcohol use Yes     Comment: very rare  . Drug use: No  . Sexual activity: Not Currently    Birth control/ protection: None   Other Topics Concern  . None   Social History Narrative   Lives with parents in a one story home.  No children.     Currently not working.  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    Education: college     Vitals:   10/18/15 1013  BP: 118/80  Pulse: 92  Resp: 12  Temp: 98.5 F (36.9 C)   Body mass index is 21.47 kg/m.  O2 sat at RA 98%   Wt Readings from Last 3 Encounters:  10/18/15 129 lb (58.5 kg)  09/19/15 130 lb (59 kg)  09/06/15 125 lb (56.7 kg)      Physical Exam  Nursing note and vitals reviewed. Constitutional: She is oriented to person, place, and time. She appears well-developed and well-nourished. No distress.  HENT:  Head: Atraumatic.  Right Ear: Hearing, tympanic membrane, external ear and ear canal normal.  Left Ear: Hearing, tympanic membrane, external ear and ear canal normal.  Mouth/Throat:  Uvula is midline, oropharynx is clear and moist and mucous membranes are normal.  Eyes: Conjunctivae and EOM are normal. Pupils are equal, round, and reactive to light.  Neck: No thyroid mass and no thyromegaly present.  Cardiovascular: Normal rate and regular rhythm.   No murmur heard. Pulses:      Dorsalis pedis pulses are 2+ on the right side, and 2+ on the left side.       Posterior tibial pulses are 2+ on the right side, and 2+ on the left side.  Respiratory: Effort normal and breath sounds normal. No respiratory distress.  GI: Soft. Bowel sounds are normal. She exhibits no mass. There is no hepatomegaly. There is tenderness (mild) in the right lower quadrant, suprapubic area and left lower quadrant. There is no rigidity, no rebound and no guarding.  Musculoskeletal: She exhibits no edema.  No major deformity or sing of synovitis appreciated.  Lymphadenopathy:    She has no cervical adenopathy.       Right: No supraclavicular adenopathy present.       Left: No supraclavicular adenopathy present.  Neurological: She is alert and oriented to person, place, and time. She has normal strength. No cranial nerve deficit. Coordination and gait normal.  Reflex Scores:      Bicep reflexes are 2+ on the right side and 2+ on the left side.      Patellar reflexes are 2+ on the right side and 2+ on the left side. Skin: Skin is warm. No rash noted. No erythema.  Psychiatric: Her speech is normal. Her mood appears anxious. Her affect is labile. Cognition and memory are normal. She exhibits a depressed mood. She expresses no suicidal ideation. She expresses no suicidal plans.  Well groomed, poor eye contact, crying a few times during visit.      ASSESSMENT AND PLAN:      Laura Simmons was seen today for annual exam.  Diagnoses and all orders for this visit:  Routine general medical examination at a health care facility   We discussed the importance of regular physical activity and healthy diet  for prevention of chronic illness and/or complications. Preventive guidelines reviewed. Vaccination up to date. She will continue following with her gyn for female  preventive care. Next CPE in 1-3 years.    Hyperlipidemia, unspecified hyperlipidemia type  Low fat diet recommended as well as regular physical activity as tolerated. Pharmacologic treatment recommended, she agrees with trying Lipitor 20 mg, some side effects discussed as well as benefits, she can start it after she has gyn procedure done. F/U in 4 months.  -     atorvastatin (LIPITOR) 20 MG tablet; Take 1 tablet (20 mg total) by mouth daily.   Chronic migraine without aura without status migrainosus, not intractable  Stable. Because she is on Xanax and Ritalin , and recently on opoid medications + psychiatric disorder I expressed my concern; she tells me that she can try the "one without codeine" Recommend not exceeding 3 tabs per week. Explained that overuse of some medications can also cause headache, this includes analgesics.   F/U in 3-4 months. -     butalbital-aspirin-caffeine (FIORINAL) 50-325-40 MG capsule; Take 1 capsule by mouth 2 (two) times daily as needed for headache. No more than 2 caps per week.   Bipolar disorder with depression ([place])  She is not interested in adding a SSRI or SNRI or other medication. Strongly recommend calling back to psychiatrists' office she called earlier and set up appt. If another Rx for Xanax is requested I am planning on decreasing dose from 1/2-1 bid to 1/2 tab bid; and Ritalin from 10 mg tid to bid. Instructed about warning signs.   Lower abdominal pain  Attributed mainly to endometriosis. Could also be aggravated by IBS. Continue following with gyn.      Return in 4 months (on 02/17/2016) for HLD,fasting labs, migraines.        Betty G. Martinique, MD  [facility]. [facility].

## 2015-10-18 NOTE — Patient Instructions (Addendum)
A few things to remember from today's visit:   Routine general medical examination at a health care facility  Hyperlipidemia - Plan: atorvastatin (LIPITOR) 20 MG tablet    At least 150 minutes of moderate exercise per week, daily brisk walking for 15-30 min is a good exercise option. Healthy diet low in saturated (animal) fats and sweets and consisting of fresh fruits and vegetables, lean meats such as fish and white chicken and whole grains.   - Vaccines:  Tdap vaccine every 10 years.  Shingles vaccine recommended at age 69, could be given after 41 years of age but not sure about insurance coverage.  Pneumonia vaccines:  Prevnar 13 at 65 and Pneumovax at 61.  Screening recommendations for low/normal risk women:  Screening for diabetes at age 80-45 and every 3 years.  Cervical cancer prevention:  -HPV vaccination between 75-52 years old. -Pap smear starts at 41 years of age and continues periodically until 41 years old in low risk women. Pap smear every 3 years between 70 and 66 years old. Pap smear every 3 years between women 86 and older if pap smear negative and HPV screening negative.   -Breast cancer: Mammogram: There is disagreement between experts about when to start screening in low risk asymptomatic female but recent recommendations are to start screening at 8 and not later than 41 years old , every 1-2 years and after 41 yo q 2 years. Screening is recommended until 41 years old but some women can continue screening depending of healthy issues.   Colon cancer screening: starts at 41 years old until 41 years old.   Call and schedule appt with psychiatrist.   Please be sure medication list is accurate. If a new problem present, please set up appointment sooner than planned today.

## 2015-10-21 ENCOUNTER — Encounter (HOSPITAL_COMMUNITY)
Admission: RE | Admit: 2015-10-21 | Discharge: 2015-10-21 | Disposition: A | Discharge status: Home / Self Care | Payer: BLUE CROSS/BLUE SHIELD | Source: Ambulatory Visit | Attending: Obstetrics and Gynecology | Admitting: Obstetrics and Gynecology

## 2015-10-21 ENCOUNTER — Encounter (HOSPITAL_COMMUNITY): Payer: Self-pay

## 2015-10-21 DIAGNOSIS — F17210 Nicotine dependence, cigarettes, uncomplicated: Secondary | ICD-10-CM | POA: Diagnosis not present

## 2015-10-21 DIAGNOSIS — K219 Gastro-esophageal reflux disease without esophagitis: Secondary | ICD-10-CM | POA: Diagnosis not present

## 2015-10-21 DIAGNOSIS — Z30430 Encounter for insertion of intrauterine contraceptive device: Secondary | ICD-10-CM | POA: Diagnosis not present

## 2015-10-21 DIAGNOSIS — N809 Endometriosis, unspecified: Secondary | ICD-10-CM | POA: Diagnosis not present

## 2015-10-21 DIAGNOSIS — N882 Stricture and stenosis of cervix uteri: Secondary | ICD-10-CM

## 2015-10-21 DIAGNOSIS — M4802 Spinal stenosis, cervical region: Secondary | ICD-10-CM | POA: Diagnosis not present

## 2015-10-21 HISTORY — DX: Other specified abnormal findings of blood chemistry: R79.89

## 2015-10-21 LAB — CBC
HCT: 39.9 % (ref 36.0–46.0)
HEMOGLOBIN: 13.9 g/dL (ref 12.0–15.0)
MCH: 32.8 pg (ref 26.0–34.0)
MCHC: 34.8 g/dL (ref 30.0–36.0)
MCV: 94.1 fL (ref 78.0–100.0)
PLATELETS: 388 10*3/uL (ref 150–400)
RBC: 4.24 MIL/uL (ref 3.87–5.11)
RDW: 13.3 % (ref 11.5–15.5)
WBC: 7.1 10*3/uL (ref 4.0–10.5)

## 2015-10-23 ENCOUNTER — Ambulatory Visit (HOSPITAL_COMMUNITY)
Admission: RE | Admit: 2015-10-23 | Discharge: 2015-10-23 | Disposition: A | Discharge status: Home / Self Care | Payer: BLUE CROSS/BLUE SHIELD | Source: Ambulatory Visit | Attending: Obstetrics and Gynecology | Admitting: Obstetrics and Gynecology

## 2015-10-23 ENCOUNTER — Encounter (HOSPITAL_COMMUNITY): Payer: Self-pay

## 2015-10-23 ENCOUNTER — Encounter (HOSPITAL_COMMUNITY)
Admission: RE | Disposition: A | Discharge status: Home / Self Care | Payer: Self-pay | Source: Ambulatory Visit | Attending: Obstetrics and Gynecology

## 2015-10-23 ENCOUNTER — Ambulatory Visit (HOSPITAL_COMMUNITY): Payer: BLUE CROSS/BLUE SHIELD

## 2015-10-23 ENCOUNTER — Ambulatory Visit (HOSPITAL_COMMUNITY): Payer: BLUE CROSS/BLUE SHIELD | Admitting: Anesthesiology

## 2015-10-23 DIAGNOSIS — N882 Stricture and stenosis of cervix uteri: Secondary | ICD-10-CM | POA: Diagnosis not present

## 2015-10-23 DIAGNOSIS — M4802 Spinal stenosis, cervical region: Secondary | ICD-10-CM | POA: Diagnosis not present

## 2015-10-23 DIAGNOSIS — G8929 Other chronic pain: Secondary | ICD-10-CM | POA: Diagnosis not present

## 2015-10-23 DIAGNOSIS — Z30430 Encounter for insertion of intrauterine contraceptive device: Secondary | ICD-10-CM | POA: Diagnosis not present

## 2015-10-23 DIAGNOSIS — K219 Gastro-esophageal reflux disease without esophagitis: Secondary | ICD-10-CM | POA: Insufficient documentation

## 2015-10-23 DIAGNOSIS — L905 Scar conditions and fibrosis of skin: Secondary | ICD-10-CM | POA: Diagnosis not present

## 2015-10-23 DIAGNOSIS — F17210 Nicotine dependence, cigarettes, uncomplicated: Secondary | ICD-10-CM | POA: Insufficient documentation

## 2015-10-23 DIAGNOSIS — N809 Endometriosis, unspecified: Secondary | ICD-10-CM | POA: Insufficient documentation

## 2015-10-23 HISTORY — PX: INTRAUTERINE DEVICE (IUD) INSERTION: SHX5877

## 2015-10-23 HISTORY — DX: Endometriosis, unspecified: N80.9

## 2015-10-23 HISTORY — PX: HYSTEROSCOPY W/D&C: SHX1775

## 2015-10-23 LAB — PREGNANCY, URINE: PREG TEST UR: NEGATIVE

## 2015-10-23 SURGERY — INSERTION, INTRAUTERINE DEVICE
Anesthesia: General | Site: Vagina

## 2015-10-23 MED ORDER — ACETAMINOPHEN 500 MG PO TABS
ORAL_TABLET | ORAL | Status: AC
  Filled 2015-10-23: qty 2

## 2015-10-23 MED ORDER — HYDROMORPHONE HCL 1 MG/ML IJ SOLN
INTRAMUSCULAR | Status: AC
  Filled 2015-10-23: qty 1

## 2015-10-23 MED ORDER — LACTATED RINGERS IV SOLN
INTRAVENOUS | Status: DC
  Administered 2015-10-23: 125 mL/h via INTRAVENOUS
  Administered 2015-10-23: 08:00:00 via INTRAVENOUS

## 2015-10-23 MED ORDER — ONDANSETRON HCL 4 MG/2ML IJ SOLN: 4.0000 mg | Freq: Once | INTRAMUSCULAR | Status: DC | PRN

## 2015-10-23 MED ORDER — PROPOFOL 10 MG/ML IV BOLUS
INTRAVENOUS | Status: AC
  Filled 2015-10-23: qty 20

## 2015-10-23 MED ORDER — CEFAZOLIN SODIUM-DEXTROSE 2-4 GM/100ML-% IV SOLN
2.0000 g | INTRAVENOUS | Status: AC
  Administered 2015-10-23: 2 g via INTRAVENOUS

## 2015-10-23 MED ORDER — SCOPOLAMINE 1 MG/3DAYS TD PT72
1.0000 | MEDICATED_PATCH | Freq: Once | TRANSDERMAL | Status: DC
  Administered 2015-10-23: 1.5 mg via TRANSDERMAL

## 2015-10-23 MED ORDER — FENTANYL CITRATE (PF) 100 MCG/2ML IJ SOLN
INTRAMUSCULAR | Status: AC
  Filled 2015-10-23: qty 2

## 2015-10-23 MED ORDER — DEXAMETHASONE SODIUM PHOSPHATE 4 MG/ML IJ SOLN
INTRAMUSCULAR | Status: DC | PRN
  Administered 2015-10-23: 4 mg via INTRAVENOUS

## 2015-10-23 MED ORDER — MIDAZOLAM HCL 2 MG/2ML IJ SOLN
1.0000 mg | Freq: Once | INTRAMUSCULAR | Status: AC
  Administered 2015-10-23: 1 mg via INTRAVENOUS

## 2015-10-23 MED ORDER — PROPOFOL 10 MG/ML IV BOLUS
INTRAVENOUS | Status: DC | PRN
  Administered 2015-10-23: 200 mg via INTRAVENOUS

## 2015-10-23 MED ORDER — KETOROLAC TROMETHAMINE 10 MG PO TABS: 10.0000 mg | ORAL_TABLET | Freq: Four times a day (QID) | ORAL | 0 refills | Status: DC | PRN

## 2015-10-23 MED ORDER — OXYCODONE HCL 5 MG PO TABS
5.0000 mg | ORAL_TABLET | Freq: Once | ORAL | Status: AC
  Administered 2015-10-23: 5 mg via ORAL

## 2015-10-23 MED ORDER — DOCUSATE SODIUM 100 MG PO CAPS: 100.0000 mg | ORAL_CAPSULE | Freq: Two times a day (BID) | ORAL | 2 refills | Status: DC

## 2015-10-23 MED ORDER — SCOPOLAMINE 1 MG/3DAYS TD PT72
MEDICATED_PATCH | TRANSDERMAL | Status: AC
  Administered 2015-10-23: 1.5 mg via TRANSDERMAL
  Filled 2015-10-23: qty 1

## 2015-10-23 MED ORDER — ACETAMINOPHEN 500 MG PO TABS
1000.0000 mg | ORAL_TABLET | Freq: Once | ORAL | Status: AC
  Administered 2015-10-23: 1000 mg via ORAL

## 2015-10-23 MED ORDER — ONDANSETRON HCL 4 MG/2ML IJ SOLN
INTRAMUSCULAR | Status: DC | PRN
  Administered 2015-10-23: 4 mg via INTRAVENOUS

## 2015-10-23 MED ORDER — FENTANYL CITRATE (PF) 100 MCG/2ML IJ SOLN
INTRAMUSCULAR | Status: DC | PRN
  Administered 2015-10-23 (×3): 50 ug via INTRAVENOUS
  Administered 2015-10-23 (×2): 25 ug via INTRAVENOUS

## 2015-10-23 MED ORDER — MIDAZOLAM HCL 2 MG/2ML IJ SOLN
INTRAMUSCULAR | Status: AC
  Filled 2015-10-23: qty 2

## 2015-10-23 MED ORDER — HYDROMORPHONE HCL 1 MG/ML IJ SOLN
0.2500 mg | INTRAMUSCULAR | Status: DC | PRN
  Administered 2015-10-23 (×4): 0.5 mg via INTRAVENOUS

## 2015-10-23 MED ORDER — MIDAZOLAM HCL 5 MG/5ML IJ SOLN
INTRAMUSCULAR | Status: DC | PRN
  Administered 2015-10-23: 2 mg via INTRAVENOUS

## 2015-10-23 MED ORDER — LIDOCAINE HCL 1 % IJ SOLN
INTRAMUSCULAR | Status: DC | PRN
  Administered 2015-10-23: 10 mL

## 2015-10-23 MED ORDER — LIDOCAINE 2% (20 MG/ML) 5 ML SYRINGE
INTRAMUSCULAR | Status: DC | PRN
  Administered 2015-10-23: 50 mg via INTRAVENOUS

## 2015-10-23 MED ORDER — MEPERIDINE HCL 25 MG/ML IJ SOLN: 6.2500 mg | INTRAMUSCULAR | Status: DC | PRN

## 2015-10-23 MED ORDER — LIDOCAINE HCL (CARDIAC) 20 MG/ML IV SOLN
INTRAVENOUS | Status: AC
  Filled 2015-10-23: qty 5

## 2015-10-23 MED ORDER — OXYCODONE HCL 5 MG PO TABS
ORAL_TABLET | ORAL | Status: AC
  Filled 2015-10-23: qty 1

## 2015-10-23 MED ORDER — KETOROLAC TROMETHAMINE 30 MG/ML IJ SOLN
INTRAMUSCULAR | Status: AC
  Filled 2015-10-23: qty 1

## 2015-10-23 MED ORDER — DEXAMETHASONE SODIUM PHOSPHATE 4 MG/ML IJ SOLN
INTRAMUSCULAR | Status: AC
  Filled 2015-10-23: qty 1

## 2015-10-23 MED ORDER — LIDOCAINE HCL 1 % IJ SOLN
INTRAMUSCULAR | Status: AC
  Filled 2015-10-23: qty 20

## 2015-10-23 MED ORDER — KETOROLAC TROMETHAMINE 30 MG/ML IJ SOLN
INTRAMUSCULAR | Status: DC | PRN
  Administered 2015-10-23: 30 mg via INTRAVENOUS

## 2015-10-23 MED ORDER — HYDROMORPHONE HCL 1 MG/ML IJ SOLN
0.5000 mg | Freq: Once | INTRAMUSCULAR | Status: AC
  Administered 2015-10-23: 0.5 mg via INTRAVENOUS

## 2015-10-23 MED ORDER — ONDANSETRON HCL 4 MG/2ML IJ SOLN
INTRAMUSCULAR | Status: AC
  Filled 2015-10-23: qty 2

## 2015-10-23 MED ORDER — DEXTROSE 5 % IV SOLN: 3.0000 g | INTRAVENOUS | Status: DC

## 2015-10-23 SURGICAL SUPPLY — 16 items
CATH ROBINSON RED A/P 16FR (CATHETERS) ×4 IMPLANT
CLOTH BEACON ORANGE TIMEOUT ST (SAFETY) ×4 IMPLANT
CONTAINER PREFILL 10% NBF 60ML (FORM) ×8 IMPLANT
DILATOR CANAL MILEX (MISCELLANEOUS) ×4 IMPLANT
GLOVE BIOGEL PI IND STRL 6.5 (GLOVE) ×2 IMPLANT
GLOVE BIOGEL PI IND STRL 7.0 (GLOVE) ×4 IMPLANT
GLOVE BIOGEL PI INDICATOR 6.5 (GLOVE) ×2
GLOVE BIOGEL PI INDICATOR 7.0 (GLOVE) ×4
GOWN STRL REUS W/TWL LRG LVL3 (GOWN DISPOSABLE) ×8 IMPLANT
NEEDLE SPNL 22GX3.5 QUINCKE BK (NEEDLE) ×4 IMPLANT
PACK VAGINAL MINOR WOMEN LF (CUSTOM PROCEDURE TRAY) ×4 IMPLANT
PAD PREP 24X48 CUFFED NSTRL (MISCELLANEOUS) ×4 IMPLANT
TOWEL OR 17X24 6PK STRL BLUE (TOWEL DISPOSABLE) ×8 IMPLANT
TUBING AQUILEX INFLOW (TUBING) ×4 IMPLANT
TUBING AQUILEX OUTFLOW (TUBING) ×4 IMPLANT
WATER STERILE IRR 1000ML POUR (IV SOLUTION) ×4 IMPLANT

## 2015-10-23 NOTE — Op Note (Signed)
PREOPERATIVE DIAGNOSES: 1. Cervical stenosis/uterine scar tissue/sharply anteverted uterus 2. Endometriosis 3. Desire Mirena IUD  POSTOPERATIVE DIAGNOSES: Same  PROCEDURE PERFORMED: dilation, HSC, removal of LUS scar tissue with sharp curettage, Mirena IUD insertion  SURGEON: Dr. Lucillie Garfinkel  ANESTHESIA: MAC  ESTIMATED BLOOD LOSS: 5cc.  URINE OUTPUT: 50 cc of clear urine at the beginning of the procedure.  COMPLICATIONS: None  TUBES: None.  DRAINS: None  PATHOLOGY: LUS scar scrapings, to pathology  FINDINGS:  Sharply anteverted uterus with dense scar tissue in posterior aspect of LUS creating a thick ridge making it difficult to place the IUD with plastic applicator.  Bilateral ostia visualized [name] confirmed proper IUD placement in cavity at the end of the case   Procedure: Patient was brought to the OR and MAC anesthesia was given. She was prepped in the dorsal lithotomy position in the usual fashion. Sterile speculum inserted into vagina and paracervical block given at 4 and 8 o'clock for a total of 10cc of 1% lidocaine. Entire procedure was performed under US guidance. Cervical dilation with prat and haney dilators was attempted but unsuccessful after multiple attempts - based on the feel of the uterus and the image on [name], it appeared there was scar tissue present in the posterior LUS that was unable to be passed. Hysteroscopy was performed and through hydrodissection, the area of scar tissue was isolated. A sharp curretage was performed in the area (posterior LUS) under US guidance and entry into the uterine cavity was then successful. The uterine cavity was sharply anteverted as well as tilted to the left side of her body. [place] showed a normal appearing cavity with b/l ostia visualized. Photos were taken. Cervix then further dilated and the Mirena IUD was ultimately able to be passed to the uterine fundus. Entire procedure performed under ultrasound guidance with excellent  visualization. Mirena IUD was noted to be properly placed at the termination of the procedure.  Strings were cut, tenaculum removed, speculum removed and counts were correct. Pt sent to recovery in stable condition.    Mirena IUD Lot [id number] Exp 1/20   Lucillie Garfinkel MD

## 2015-10-23 NOTE — Anesthesia Procedure Notes (Signed)
Procedure Name: LMA Insertion Date/Time: 10/23/2015 7:42 AM Performed by: Bufford Spikes Pre-anesthesia Checklist: Patient identified, Emergency Drugs available, Suction available, Patient being monitored and Timeout performed Patient Re-evaluated:Patient Re-evaluated prior to inductionOxygen Delivery Method: Circle system utilized Preoxygenation: Pre-oxygenation with 100% oxygen Intubation Type: IV induction Ventilation: Mask ventilation without difficulty LMA: LMA inserted LMA Size: 4.0 Tube type: Oral Number of attempts: 1 Placement Confirmation: ETT inserted through vocal cords under direct vision,  positive ETCO2 and breath sounds checked- equal and bilateral Tube secured with: Tape Dental Injury: Teeth and Oropharynx as per pre-operative assessment

## 2015-10-23 NOTE — Anesthesia Preprocedure Evaluation (Signed)
Anesthesia Evaluation  Patient identified by MRN, date of birth, ID band Patient awake    Reviewed: Allergy & Precautions, NPO status , Patient's Chart, lab work & pertinent test results  Airway Mallampati: I  TM Distance: >3 FB Neck ROM: Full    Dental   Pulmonary Current Smoker,    Pulmonary exam normal        Cardiovascular Normal cardiovascular exam     Neuro/Psych Depression Bipolar Disorder    GI/Hepatic GERD  Medicated and Controlled,  Endo/Other    Renal/GU      Musculoskeletal   Abdominal   Peds  Hematology   Anesthesia Other Findings   Reproductive/Obstetrics                             Anesthesia Physical Anesthesia Plan  ASA: II  Anesthesia Plan: General   Post-op Pain Management:    Induction: Intravenous  Airway Management Planned: LMA  Additional Equipment:   Intra-op Plan:   Post-operative Plan: Extubation in OR  Informed Consent: I have reviewed the patients History and Physical, chart, labs and discussed the procedure including the risks, benefits and alternatives for the proposed anesthesia with the patient or authorized representative who has indicated his/her understanding and acceptance.     Plan Discussed with: CRNA and Surgeon  Anesthesia Plan Comments:         Anesthesia Quick Evaluation

## 2015-10-23 NOTE — Interval H&P Note (Signed)
History and Physical Interval Note:  10/23/2015 7:30 AM  Laura Simmons  has presented today for surgery, with the diagnosis of cervical stenosis  The various methods of treatment have been discussed with the patient and family. After consideration of risks, benefits and other options for treatment, the patient has consented to  Procedure(s): INTRAUTERINE DEVICE (IUD) INSERTION with dilation of cervix with ultrasound guidance (N/A) as a surgical intervention .  The patient's history has been reviewed, patient examined, no change in status, stable for surgery.  I have reviewed the patient's chart and labs.  Questions were answered to the patient's satisfaction.     [name]

## 2015-10-23 NOTE — Brief Op Note (Signed)
10/23/2015  9:25 AM  PATIENT:  Laura Simmons  40 y.o. female  PRE-OPERATIVE DIAGNOSIS:  cervical stenosis, desires Mirena IUD  POST-OPERATIVE DIAGNOSIS:  cervical stenosis  PROCEDURE:  Procedure(s): INTRAUTERINE DEVICE (IUD) INSERTION with dilation of cervix with ultrasound guidance (N/A) DILATATION AND CURETTAGE /HYSTEROSCOPY  SURGEON:  Surgeon(s) and Role:    * [name], MD - Primary  ANESTHESIA:   IV sedation and paracervical block  EBL:  Total I/O In: 1100 [I.V.:1100] Out: 125 [Urine:100; Blood:25]  BLOOD ADMINISTERED:none  DRAINS: none   LOCAL MEDICATIONS USED:  LIDOCAINE  and Amount: 10 ml  SPECIMEN:  Scraping  DISPOSITION OF SPECIMEN:  PATHOLOGY  COUNTS:  YES  TOURNIQUET:  * No tourniquets in log *  DICTATION: .Note written in EPIC  PLAN OF CARE: Discharge to home after PACU  PATIENT DISPOSITION:  PACU - hemodynamically stable.   Delay start of Pharmacological VTE agent (>24hrs) due to surgical blood loss or risk of bleeding: not applicable

## 2015-10-23 NOTE — Anesthesia Postprocedure Evaluation (Signed)
Anesthesia Post Note  Patient: Games developer  Procedure(s) Performed: Procedure(s) (LRB): INTRAUTERINE DEVICE (IUD) INSERTION with dilation of cervix with ultrasound guidance (N/A) DILATATION AND CURETTAGE /HYSTEROSCOPY  Patient location during evaluation: PACU Anesthesia Type: General Level of consciousness: awake and alert Pain management: pain level controlled Vital Signs Assessment: post-procedure vital signs reviewed and stable Respiratory status: spontaneous breathing, nonlabored ventilation, respiratory function stable and patient connected to nasal cannula oxygen Cardiovascular status: blood pressure returned to baseline and stable Postop Assessment: no signs of nausea or vomiting Anesthetic complications: no     Last Vitals:  Vitals:   10/23/15 0930 10/23/15 0945  BP: 129/82 125/84  Pulse: 89 83  Resp: 11 12  Temp:      Last Pain:  Vitals:   10/23/15 0945  TempSrc:   PainSc: 5    Pain Goal: Patients Stated Pain Goal: 3 (10/23/15 0945)               Cortina Vultaggio DAVID

## 2015-10-23 NOTE — Transfer of Care (Signed)
Immediate Anesthesia Transfer of Care Note  Patient: Laura Simmons  Procedure(s) Performed: Procedure(s): INTRAUTERINE DEVICE (IUD) INSERTION with dilation of cervix with ultrasound guidance (N/A) DILATATION AND CURETTAGE /HYSTEROSCOPY  Patient Location: PACU  Anesthesia Type:General  Level of Consciousness: awake, alert  and oriented  Airway & Oxygen Therapy: Patient Spontanous Breathing and Patient connected to nasal cannula oxygen  Post-op Assessment: Report given to RN and Post -op Vital signs reviewed and stable  Post vital signs: Reviewed and stable  Last Vitals:  Vitals:   10/23/15 0631 10/23/15 0855  BP: (!) 130/96 136/69  Resp: 20 14  Temp: 36.8 C 36.9 C    Last Pain:  Vitals:   10/23/15 0631  TempSrc: Oral  PainSc: 4       Patients Stated Pain Goal: 3 ([id number])  Complications: No apparent anesthesia complications

## 2015-10-23 NOTE — Discharge Instructions (Signed)
DISCHARGE INSTRUCTIONS: HYSTEROSCOPY/ D&C The following instructions have been prepared to help you care for yourself upon your return home.  May Remove Scop patch on or before Saturday 10/26/15. Wash your hands with soap and water after any contact with the patch.  May take Toradol after 2:45pm 10/23/15. Do not take with Ibuprofen/Motrin/Advil.  May take stool softner while taking narcotic pain medication to prevent constipation.  Drink plenty of water.  Personal hygiene:  Use sanitary pads for vaginal drainage, not tampons.  Shower the day after your procedure.  NO tub baths, pools or Jacuzzis for 2-3 weeks.  Wipe front to back after using the bathroom.  Activity and limitations:  Do NOT drive or operate any equipment for 24 hours. The effects of anesthesia are still present and drowsiness may result.  Do NOT rest in bed all day.  Walking is encouraged.  Walk up and down stairs slowly.  You may resume your normal activity in one to two days or as indicated by your physician. Sexual activity: NO intercourse for at least 2 weeks after the procedure, or as indicated by your Doctor.  Diet: Eat a light meal as desired this evening. You may resume your usual diet tomorrow.  Return to Work: You may resume your work activities in one to two days or as indicated by Marine scientist.  What to expect after your surgery: Expect to have vaginal bleeding/discharge for 2-3 days and spotting for up to 10 days. It is not unusual to have soreness for up to 1-2 weeks. You may have a slight burning sensation when you urinate for the first day. Mild cramps may continue for a couple of days. You may have a regular period in 2-6 weeks.  Call your doctor for any of the following:  Excessive vaginal bleeding or clotting, saturating and changing one pad every hour.  Inability to urinate 6 hours after discharge from hospital.  Pain not relieved by pain medication.  Fever of 100.4 F or  greater.  Unusual vaginal discharge or odor.  Return to office _________________Call for an appointment ___________________ Patients signature: ______________________ Nurses signature ________________________  [facility] [phone]

## 2015-10-23 NOTE — Progress Notes (Signed)
Called MD re: pain meds for home. Discussed medications received in PACU/OR, and current pain level 5.5/10 and anxiety over returning home with Toradol PO. MD to write rx, pt mother to pick up rx at office when leaving hospital. Pt aware of plan, verbalizes understanding.

## 2015-10-24 ENCOUNTER — Encounter (HOSPITAL_COMMUNITY): Payer: Self-pay | Admitting: Obstetrics and Gynecology

## 2015-10-28 ENCOUNTER — Encounter: Payer: Self-pay | Admitting: Family Medicine

## 2015-10-30 DIAGNOSIS — F321 Major depressive disorder, single episode, moderate: Secondary | ICD-10-CM | POA: Diagnosis not present

## 2015-10-31 DIAGNOSIS — F321 Major depressive disorder, single episode, moderate: Secondary | ICD-10-CM | POA: Diagnosis not present

## 2015-11-01 ENCOUNTER — Telehealth: Payer: Self-pay

## 2015-11-01 MED ORDER — ALPRAZOLAM 0.5 MG PO TABS: ORAL_TABLET | ORAL | 0 refills | Status: DC

## 2015-11-01 NOTE — Telephone Encounter (Signed)
Patient has an upcoming appointment with the therapist. Rx sent in for Alprazolam 0.5mg  for #30 with 0 refills.

## 2015-11-01 NOTE — Telephone Encounter (Signed)
Patient's last Rx was filled on the 13th. Patient needs a weening supply of the Alprazolam. Okay to send in?

## 2015-11-01 NOTE — Telephone Encounter (Signed)
Spoke with Dr. Martinique - Dr. Martinique states patient is aware she must establish with a psychiatrist for management of speciality care and also control medications (Alprazolam - Xanax). Dr. Martinique requested:  1.) Confirm with patient an appointment has been made 2.) Refill weening supply of 0.5 mg tablet  Take BID x2 weeks  #30 - no refills if no appointment has been made yet  Sarah, Dr. Doug Sou CMA is aware of conversation and instructions. Will route message to Judson Roch.

## 2015-11-18 DIAGNOSIS — G44211 Episodic tension-type headache, intractable: Secondary | ICD-10-CM | POA: Diagnosis not present

## 2015-11-18 DIAGNOSIS — G43719 Chronic migraine without aura, intractable, without status migrainosus: Secondary | ICD-10-CM | POA: Diagnosis not present

## 2015-11-18 DIAGNOSIS — M542 Cervicalgia: Secondary | ICD-10-CM | POA: Diagnosis not present

## 2015-11-24 ENCOUNTER — Emergency Department (HOSPITAL_BASED_OUTPATIENT_CLINIC_OR_DEPARTMENT_OTHER): Payer: BLUE CROSS/BLUE SHIELD

## 2015-11-24 ENCOUNTER — Observation Stay (HOSPITAL_BASED_OUTPATIENT_CLINIC_OR_DEPARTMENT_OTHER)
Admission: EM | Admit: 2015-11-24 | Discharge: 2015-11-26 | Disposition: A | Discharge status: Home / Self Care | Payer: BLUE CROSS/BLUE SHIELD | Attending: Family Medicine | Admitting: Family Medicine

## 2015-11-24 ENCOUNTER — Encounter (HOSPITAL_BASED_OUTPATIENT_CLINIC_OR_DEPARTMENT_OTHER): Payer: Self-pay | Admitting: Emergency Medicine

## 2015-11-24 DIAGNOSIS — R0602 Shortness of breath: Secondary | ICD-10-CM | POA: Diagnosis not present

## 2015-11-24 DIAGNOSIS — Z888 Allergy status to other drugs, medicaments and biological substances status: Secondary | ICD-10-CM | POA: Insufficient documentation

## 2015-11-24 DIAGNOSIS — R002 Palpitations: Secondary | ICD-10-CM | POA: Insufficient documentation

## 2015-11-24 DIAGNOSIS — Z833 Family history of diabetes mellitus: Secondary | ICD-10-CM | POA: Diagnosis not present

## 2015-11-24 DIAGNOSIS — K589 Irritable bowel syndrome without diarrhea: Secondary | ICD-10-CM | POA: Diagnosis not present

## 2015-11-24 DIAGNOSIS — F411 Generalized anxiety disorder: Secondary | ICD-10-CM | POA: Insufficient documentation

## 2015-11-24 DIAGNOSIS — F419 Anxiety disorder, unspecified: Secondary | ICD-10-CM | POA: Diagnosis not present

## 2015-11-24 DIAGNOSIS — G43909 Migraine, unspecified, not intractable, without status migrainosus: Secondary | ICD-10-CM | POA: Insufficient documentation

## 2015-11-24 DIAGNOSIS — E785 Hyperlipidemia, unspecified: Secondary | ICD-10-CM | POA: Diagnosis not present

## 2015-11-24 DIAGNOSIS — R1030 Lower abdominal pain, unspecified: Secondary | ICD-10-CM | POA: Diagnosis not present

## 2015-11-24 DIAGNOSIS — R7989 Other specified abnormal findings of blood chemistry: Secondary | ICD-10-CM

## 2015-11-24 DIAGNOSIS — F319 Bipolar disorder, unspecified: Secondary | ICD-10-CM | POA: Insufficient documentation

## 2015-11-24 DIAGNOSIS — K219 Gastro-esophageal reflux disease without esophagitis: Secondary | ICD-10-CM | POA: Diagnosis not present

## 2015-11-24 DIAGNOSIS — R102 Pelvic and perineal pain: Secondary | ICD-10-CM | POA: Insufficient documentation

## 2015-11-24 DIAGNOSIS — R000 Tachycardia, unspecified: Secondary | ICD-10-CM | POA: Diagnosis not present

## 2015-11-24 DIAGNOSIS — N939 Abnormal uterine and vaginal bleeding, unspecified: Secondary | ICD-10-CM | POA: Insufficient documentation

## 2015-11-24 DIAGNOSIS — F909 Attention-deficit hyperactivity disorder, unspecified type: Secondary | ICD-10-CM | POA: Diagnosis not present

## 2015-11-24 DIAGNOSIS — R778 Other specified abnormalities of plasma proteins: Secondary | ICD-10-CM | POA: Insufficient documentation

## 2015-11-24 DIAGNOSIS — R110 Nausea: Secondary | ICD-10-CM | POA: Diagnosis not present

## 2015-11-24 DIAGNOSIS — Z8249 Family history of ischemic heart disease and other diseases of the circulatory system: Secondary | ICD-10-CM | POA: Diagnosis not present

## 2015-11-24 DIAGNOSIS — R079 Chest pain, unspecified: Secondary | ICD-10-CM | POA: Diagnosis not present

## 2015-11-24 DIAGNOSIS — R748 Abnormal levels of other serum enzymes: Secondary | ICD-10-CM | POA: Diagnosis present

## 2015-11-24 DIAGNOSIS — F410 Panic disorder [episodic paroxysmal anxiety] without agoraphobia: Principal | ICD-10-CM | POA: Insufficient documentation

## 2015-11-24 DIAGNOSIS — Z823 Family history of stroke: Secondary | ICD-10-CM | POA: Diagnosis not present

## 2015-11-24 LAB — COMPREHENSIVE METABOLIC PANEL
ALBUMIN: 4.6 g/dL (ref 3.5–5.0)
ALT: 12 U/L — ABNORMAL LOW (ref 14–54)
ANION GAP: 13 (ref 5–15)
AST: 15 U/L (ref 15–41)
Alkaline Phosphatase: 44 U/L (ref 38–126)
BUN: 14 mg/dL (ref 6–20)
CO2: 17 mmol/L — AB (ref 22–32)
Calcium: 9.7 mg/dL (ref 8.9–10.3)
Chloride: 108 mmol/L (ref 101–111)
Creatinine, Ser: 1.02 mg/dL — ABNORMAL HIGH (ref 0.44–1.00)
GFR calc Af Amer: >60 mL/min (ref >60)
GFR calc non Af Amer: >60 mL/min (ref >60)
GLUCOSE: 98 mg/dL (ref 65–99)
POTASSIUM: 3.6 mmol/L (ref 3.5–5.1)
SODIUM: 138 mmol/L (ref 135–145)
TOTAL PROTEIN: 8 g/dL (ref 6.5–8.1)
Total Bilirubin: 0.5 mg/dL (ref 0.3–1.2)

## 2015-11-24 LAB — URINALYSIS, ROUTINE W REFLEX MICROSCOPIC
Bilirubin Urine: NEGATIVE
Glucose, UA: NEGATIVE mg/dL
Hgb urine dipstick: NEGATIVE
KETONES UR: NEGATIVE mg/dL
LEUKOCYTES UA: NEGATIVE
NITRITE: NEGATIVE
PH: 5 (ref 5.0–8.0)
Protein, ur: NEGATIVE mg/dL
Specific Gravity, Urine: 1.015 (ref 1.005–1.030)

## 2015-11-24 LAB — CBC WITH DIFFERENTIAL/PLATELET
BASOS PCT: 0 %
Basophils Absolute: 0 10*3/uL (ref 0.0–0.1)
EOS ABS: 0.8 10*3/uL — AB (ref 0.0–0.7)
Eosinophils Relative: 5 %
HCT: 44.4 % (ref 36.0–46.0)
Hemoglobin: 15.7 g/dL — ABNORMAL HIGH (ref 12.0–15.0)
Lymphocytes Relative: 18 %
Lymphs Abs: 2.9 10*3/uL (ref 0.7–4.0)
MCH: 32.3 pg (ref 26.0–34.0)
MCHC: 35.4 g/dL (ref 30.0–36.0)
MCV: 91.4 fL (ref 78.0–100.0)
MONO ABS: 1 10*3/uL (ref 0.1–1.0)
Monocytes Relative: 6 %
NEUTROS ABS: 11.6 10*3/uL — AB (ref 1.7–7.7)
Neutrophils Relative %: 71 %
Platelets: 420 10*3/uL — ABNORMAL HIGH (ref 150–400)
RBC: 4.86 MIL/uL (ref 3.87–5.11)
RDW: 13 % (ref 11.5–15.5)
WBC: 16.3 10*3/uL — ABNORMAL HIGH (ref 4.0–10.5)

## 2015-11-24 LAB — D-DIMER, QUANTITATIVE: D-Dimer, Quant: <0.27 ug/mL-FEU (ref 0.00–0.50)

## 2015-11-24 LAB — TROPONIN I: TROPONIN I: 0.03 ng/mL — AB (ref <0.03)

## 2015-11-24 LAB — PREGNANCY, URINE: PREG TEST UR: NEGATIVE

## 2015-11-24 MED ORDER — SODIUM CHLORIDE 0.9 % IV BOLUS (SEPSIS)
1000.0000 mL | Freq: Once | INTRAVENOUS | Status: AC
  Administered 2015-11-24: 1000 mL via INTRAVENOUS

## 2015-11-24 MED ORDER — IOPAMIDOL (ISOVUE-300) INJECTION 61%
100.0000 mL | Freq: Once | INTRAVENOUS | Status: AC | PRN
  Administered 2015-11-24: 100 mL via INTRAVENOUS

## 2015-11-24 MED ORDER — NITROGLYCERIN 0.4 MG SL SUBL
0.4000 mg | SUBLINGUAL_TABLET | SUBLINGUAL | Status: AC | PRN
Start: 2015-11-24 — End: 2015-11-25
  Administered 2015-11-24 – 2015-11-25 (×3): 0.4 mg via SUBLINGUAL
  Filled 2015-11-24: qty 1

## 2015-11-24 MED ORDER — ACETAMINOPHEN 325 MG PO TABS
650.0000 mg | ORAL_TABLET | Freq: Once | ORAL | Status: AC
  Administered 2015-11-25: 650 mg via ORAL
  Filled 2015-11-24: qty 2

## 2015-11-24 MED ORDER — ASPIRIN 81 MG PO CHEW
324.0000 mg | CHEWABLE_TABLET | Freq: Once | ORAL | Status: AC
  Administered 2015-11-24: 324 mg via ORAL
  Filled 2015-11-24: qty 4

## 2015-11-24 MED ORDER — LORAZEPAM 2 MG/ML IJ SOLN
1.0000 mg | Freq: Once | INTRAMUSCULAR | Status: AC
  Administered 2015-11-24: 1 mg via INTRAVENOUS
  Filled 2015-11-24: qty 1

## 2015-11-24 NOTE — ED Notes (Signed)
Anxious with tremor, and calm, NAD. resps e/u, speaking in clear complete sentences. VSS. Pending CT.

## 2015-11-24 NOTE — ED Notes (Signed)
Back from b/r, steady gait, EDP into room for pelvic exam, chaperone present.

## 2015-11-24 NOTE — ED Triage Notes (Signed)
Pt in c/o anxiety and feeling like she is about to have a panic attack, hx of same. States she has been out of her Xanax for 2 days. Pt alert, interactive, ambulatory in NAD.

## 2015-11-24 NOTE — ED Notes (Signed)
ntg given, EKG repeated, Dr. Leonette Monarch in to update pt on results, admission and plan of care. Pt denies questions or needs.

## 2015-11-24 NOTE — ED Notes (Signed)
Back from imaging, pending results, NS IVF bolus complete, resting in stretcher, mildly restless, NAD, interactive, no dyspnea noted, VSS, HR improved, BP remains elevated, mentions 8/10 CP and nausea, denies other sx at this time, states, "feel the same". Family at [facility]. [facility].

## 2015-11-24 NOTE — ED Provider Notes (Signed)
[facility] DEPT [facility] Provider Note   CSN: [id number] Arrival date & time: 11/24/15  1658  By signing my name below, I, Laura Simmons, attest that this documentation has been prepared under the direction and in the presence of [name], MD.  Electronically Signed: [name], ED Scribe. 11/24/15. 6:14 PM.    History   Chief Complaint Chief Complaint  Patient presents with  . Anxiety    The history is provided by the patient. No language interpreter was used.   HPI Comments: Laura Simmons is a 40 y.o. female who has a PMhx of presents to the Emergency Department complaining of increased anxiety that started earlier today. Associated constant, left sided chest pain that radiates into her neck x 3 hours, light-headedness, shortness of breath and tingling sensation in her extremities. She further expresses feeling nauseous and that her head is "foggy".  Pt says out of her Xanax two days ago. She reports trying to ween herself off of her Xanax for the past 2 weeks. She was on 2 mg and decreased her dosage to .5 mg every day. She further notes that she has been bed-ridden for the past couple of days and has not has much activity. Denies leg swelling, hx of DVT, hx of cardiac problems, hx of CVA, hx of DM, hx of HTN, cough, or congestion.   Pt further notes having diffuse abdominal pain that radiates into her left inguinal region. She is having associated vaginal bleeding that has been dark brown blood and increased to bright red blood over the past few days. Mother notes that pt has been having this vaginal bleeding twice over the course of one month, shortly after her last abdominal surgery. Pt has a hx four abdominal surgeries; one including right ovarian cyst removal and intrauterine device insertion. Denies fever, chills, or surgical hx of oophorectomy.  Past Medical History:  Diagnosis Date  . ADHD (attention deficit hyperactivity disorder)   . Agoraphobia   .  Bipolar 1 disorder ([place])    patient denies  . Chronic pain    just migraines per patient  . Endometriosis determined by laparoscopy   . GERD (gastroesophageal reflux disease)   . High platelet count ([place])   . Migraine   . Personality disorder   . Right adrenal mass ([place])    per ct 09-06-2015  . Right ovarian cyst   . Vaginal Pap smear, abnormal     Patient Active Problem List   Diagnosis Date Noted  . Chest pain 11/25/2015  . Hyperlipidemia 10/18/2015  . Bipolar disorder with depression ([place]) 10/18/2015  . Ovarian cyst 09/19/2015    Class: Present on Admission  . Endometriosis determined by laparoscopy 09/19/2015    Class: Present on Admission  . Chronic daily headache 09/03/2015  . Medication overuse headache 09/03/2015  . Chronic pain disorder 08/19/2015  . Anxiety disorder, unspecified 08/19/2015  . IBS (irritable bowel syndrome) 08/19/2015  . Migraine headache 01/06/2013    Past Surgical History:  Procedure Laterality Date  . COLONOSCOPY  2012  . HYSTEROSCOPY W/D&C  10/23/2015   Procedure: DILATATION AND CURETTAGE /HYSTEROSCOPY;  Surgeon: [name], MD;  Location: [facility];  Service: Gynecology;;  . INTRAUTERINE DEVICE (IUD) INSERTION N/A 10/23/2015   Procedure: INTRAUTERINE DEVICE (IUD) INSERTION with dilation of cervix with ultrasound guidance;  Surgeon: [name], MD;  Location: [facility];  Service: Gynecology;  Laterality: N/A;  . LAPAROSCOPIC OVARIAN CYSTECTOMY Right 09/19/2015   Procedure: LAPAROSCOPIC OVARIAN CYSTECTOMY; CAUTERIZATION OF  ENDOMETRIAL LESIONS; ENDOMETRIAL CURRETINGS;  Surgeon: [name], MD;  Location: [facility];  Service: Gynecology;  Laterality: Right;    OB History    No data available       Home Medications    Prior to Admission medications   Medication Sig Start Date End Date Taking? Authorizing Provider  ALPRAZolam Duanne Moron) 0.5 MG tablet Take 1 tablet by mouth twice a day for 2 weeks.  11/01/15   Betty G Martinique, MD  ALPRAZolam Duanne Moron) 1 MG tablet Take 1/2-1 tablet by mouth twice a day as needed. Patient taking differently: Take 0.5-1 mg by mouth 2 (two) times daily as needed for anxiety. Take 1/2-1 tablet by mouth twice a day as needed. 10/02/15   Betty G Martinique, MD  Aspirin-Salicylamide-Caffeine [facility] HEADACHE POWDER PO) Take by mouth as needed.    Historical Provider, MD  atorvastatin (LIPITOR) 20 MG tablet Take 1 tablet (20 mg total) by mouth daily. 10/18/15   Betty G Martinique, MD  butalbital-acetaminophen-caffeine (FIORICET WITH CODEINE) [phone] MG capsule Take 1 capsule by mouth every 12 (twelve) hours as needed for headache (No more than 3 times per week.). 08/19/15   Betty G Martinique, MD  butalbital-aspirin-caffeine [facility]) [phone] MG capsule Take 1 capsule by mouth 2 (two) times daily as needed for headache. No more than 2 caps per week. 10/18/15   Betty G Martinique, MD  calcium carbonate (TUMS) 500 MG chewable tablet Chew 1 tablet by mouth as needed for indigestion or heartburn.    Historical Provider, MD  cyclobenzaprine (FLEXERIL) 5 MG tablet Take 1 tablet as needed at bedtime for severe headache. 09/03/15   Alda Berthold, DO  dimenhyDRINATE (DRAMAMINE) 50 MG tablet Take 50 mg by mouth at bedtime as needed for nausea.    Historical Provider, MD  docusate sodium (COLACE) 100 MG capsule Take 1 capsule (100 mg total) by mouth 2 (two) times daily. 10/23/15   [name], MD  ketorolac (TORADOL) 10 MG tablet Take 1 tablet (10 mg total) by mouth every 6 (six) hours as needed. 10/23/15   [name], MD  methylphenidate (RITALIN) 10 MG tablet Take 10 mg by mouth 3 times daily. 10/02/15   Betty G Martinique, MD  ondansetron (ZOFRAN ODT) 4 MG disintegrating tablet Take 1 tablet (4 mg total) by mouth every 8 (eight) hours as needed for nausea. 04/02/15   Tanna Furry, MD    Family History Family History  Problem Relation Age of Onset  . Migraines Mother   . Diabetes  Maternal Grandmother   . Stroke Maternal Grandfather   . Heart attack Paternal Grandfather     Social History Social History  Substance Use Topics  . Smoking status: Current Every Day Smoker    Packs/day: 0.50    Years: 20.00    Types: Cigarettes  . Smokeless tobacco: Never Used  . Alcohol use Yes     Comment: very rare     Allergies   Sumatriptan succinate; Triptans; Compazine [prochlorperazine]; Ketamine; and Reglan [metoclopramide]   Review of Systems Review of Systems  All other systems reviewed and are negative.   A complete 10 system review of systems was obtained and all systems are negative except as noted in the HPI and PMH.   Physical Exam Updated Vital Signs BP (!) 151/113   Pulse (!) 142   Temp 98.9 F (37.2 C)   Resp 22   Wt 135 lb (61.2 kg)   LMP 11/22/2015  SpO2 98%   BMI 22.47 kg/m     Physical Exam  Constitutional: She is oriented to person, place, and time. She appears well-developed and well-nourished. No distress.  Pt appears anxious  HENT:  Head: Normocephalic and atraumatic.  Nose: Nose normal.  Eyes: Conjunctivae and EOM are normal. Pupils are equal, round, and reactive to light. Right eye exhibits no discharge. Left eye exhibits no discharge. No scleral icterus.  Neck: Normal range of motion. Neck supple.  Cardiovascular: Regular rhythm.  Tachycardia present.  Exam reveals no gallop and no friction rub.   No murmur heard. Pulmonary/Chest: Effort normal and breath sounds normal. No stridor. No respiratory distress. She has no rales.  Abdominal: Soft. She exhibits no distension. There is no tenderness.  Left upper, left lower, suprapubic abdominal pain, no rigidity, no guarding  Musculoskeletal: She exhibits no edema or tenderness.  Neurological: She is alert and oriented to person, place, and time.  Skin: Skin is warm and dry. No rash noted. She is not diaphoretic. No erythema.  Psychiatric: She has a normal mood and affect.  Vitals  reviewed.    ED Treatments / Results  DIAGNOSTIC STUDIES: Oxygen Saturation is 98% on RA, normal by my interpretation.  COORDINATION OF CARE:  6:06 PM Discussed treatment plan with pt at bedside and pt agreed to plan.  Labs (all labs ordered are listed, but only abnormal results are displayed) Labs Reviewed  CBC WITH DIFFERENTIAL/PLATELET - Abnormal; Notable for the following:       Result Value   WBC 16.3 (*)    Hemoglobin 15.7 (*)    Platelets 420 (*)    Neutro Abs 11.6 (*)    Eosinophils Absolute 0.8 (*)    All other components within normal limits  COMPREHENSIVE METABOLIC PANEL - Abnormal; Notable for the following:    CO2 17 (*)    Creatinine, Ser 1.02 (*)    ALT 12 (*)    All other components within normal limits  TROPONIN I - Abnormal; Notable for the following:    Troponin I 0.03 (*)    All other components within normal limits  D-DIMER, QUANTITATIVE (NOT AT [facility])  URINALYSIS, ROUTINE W REFLEX MICROSCOPIC (NOT AT [facility])  TROPONIN I  PREGNANCY, URINE    EKG  EKG Interpretation  Date/Time:  Sunday November 24 2015 18:27:07 EST Ventricular Rate:  115 PR Interval:    QRS Duration: 80 QT Interval:  313 QTC Calculation: 433 R Axis:   75 Text Interpretation:  Sinus tachycardia Consider right atrial enlargement Low voltage, precordial leads Abnormal R-wave progression, early transition Baseline wander Confirmed by [facility] MD, PEDRO ([id number]) on 11/24/2015 6:32:19 PM       Radiology Dg Chest 2 View  Result Date: 11/24/2015 CLINICAL DATA:  Chest pain, shortness breath common nausea for 2 days. EXAM: CHEST  2 VIEW COMPARISON:  01/19/2013 FINDINGS: The heart size and mediastinal contours are within normal limits. Both lungs are clear. The visualized skeletal structures are unremarkable. IMPRESSION: No active cardiopulmonary disease. Electronically Signed   By: Earle Gell M.D.   On: 11/24/2015 19:04   Ct Abdomen Pelvis W Contrast  Result Date: 11/25/2015 CLINICAL  DATA:  Low abdominal pain radiating to left inguinal region. Associated vaginal bleeding increase in the bright red blood over the past few days. History of ovarian cystectomy, IUD last month. Right adrenal mass. EXAM: CT ABDOMEN AND PELVIS WITH CONTRAST TECHNIQUE: Multidetector CT imaging of the abdomen and pelvis was performed using the standard protocol  following bolus administration of intravenous contrast. CONTRAST:  183mL ISOVUE-300 IOPAMIDOL (ISOVUE-300) INJECTION 61% COMPARISON:  09/06/2015 and 04/02/2015 CT FINDINGS: Lower chest: No acute abnormality. Hepatobiliary: No focal liver abnormality is seen. No gallstones, gallbladder wall thickening, or biliary dilatation. Pancreas: Unremarkable. No pancreatic ductal dilatation or surrounding inflammatory changes. Spleen: Normal in size without focal abnormality. Adrenals/Urinary Tract: Stable 13 mm hypodense nodule in the right adrenal gland. By Hounsfield unit of 27, this is consistent with an adenoma. Left adrenal gland is unremarkable. Kidneys are normal, without renal calculi, focal lesion, or hydronephrosis. Bladder is unremarkable. Stomach/Bowel: No small bowel dilatation. Increased colonic stool burden without inflammation. Appendix is again not visualized but no pericecal inflammation is identified. Vascular/Lymphatic: No significant vascular findings are present. No enlarged abdominal or pelvic lymph nodes. Reproductive: IUD seen in the expected location of the endometrial cavity. 3.2 x 2.7 cm adnexal cyst on the right is smaller than on prior recent comparison port measured 5.2 x 3.7 cm. Other: No abdominal wall hernia or abnormality. No abdominopelvic ascites. Musculoskeletal: Choose IMPRESSION: Smaller appearing right adnexal simple appearing cyst canal estimated at 3.2 x 2.7 versus 5.2 x 3.7 cm previously. Increased colonic stool burden without acute bowel obstruction or inflammation. Findings suggest constipation. Stable 13 mm hypodense lesion of  the right adrenal gland. Electronically Signed   By: Ashley Royalty M.D.   On: 11/25/2015 00:06    Procedures Procedures (including critical care time)  Medications Ordered in ED Medications  sodium chloride 0.9 % bolus 1,000 mL (0 mLs Intravenous Stopped 11/24/15 1926)  aspirin chewable tablet 324 mg (324 mg Oral Given 11/24/15 2253)  iopamidol (ISOVUE-300) 61 % injection 100 mL (100 mLs Intravenous Contrast Given 11/24/15 2324)  LORazepam (ATIVAN) injection 1 mg (1 mg Intravenous Given 11/24/15 2318)  nitroGLYCERIN (NITROSTAT) SL tablet 0.4 mg (0.4 mg Sublingual Given 11/25/15 0012)  acetaminophen (TYLENOL) tablet 650 mg (650 mg Oral Given 11/25/15 0011)     Initial Impression / Assessment and Plan / ED Course  I have reviewed the triage vital signs and the nursing notes.  Pertinent labs & imaging results that were available during my care of the patient were reviewed by me and considered in my medical decision making (see chart for details).  Clinical Course     1. Chest pain Atypical chest pain. Significant history of anxiety and panic attacks. EKG without acute ischemic changes.  Chest x-ray without evidence suggestive of pneumonia, pneumothorax, pneumomediastinum.  No abnormal contour of the mediastinum to suggest dissection. No evidence of acute injuries. Low HEAR. Felt she was appropriate for delta troponins. Initial troponin negative. Second troponin elevated at 0.03.  Patient has significant sinus tachycardia to the 140s on presentation, which resolved spontaneously. Feel this is likely secondary to demand ischemia from tachycardia however patient will require admission to trend troponins. Patient was given 324 of aspirin.  Given her prolonged immobilization recently, we obtained a delta troponin which is negative. Doubt pulmonary embolism. Presentation is classic for aortic dissection or esophageal perforation.  2. Pelvic pain and vaginal bleeding Patient with complicated recent  history of IUD placement. Patient denies any fevers. Currently she is afebrile. Tachycardia self resolved. Patient does have leukocytosis. Pelvic exam without discharge. Unable to obtain an ultrasound at this time. Obtained a CT which did not show any evidence of inflammation, suggestive of endometritis. However ultrasound would be more specific and sensitive. This is discussed with hospitalist.  3. Anxiety with possible benzo withdrawal No reported seizures. Tachycardia self resolved  without benzodiazepines. Patient's anxiety and agitation able to be improved with therapeutic breathing.   Once admission was secured and patient was given small dose of Ativan for her anxiety.  Discussed case with on-call hospitalist who will admit for serial troponin trending.  Final Clinical Impressions(s) / ED Diagnoses   Final diagnoses:  Chest pain  Pelvic pain  Anxiety state  Tachycardia  Elevated troponin   I personally performed the services described in this documentation, which was scribed in my presence. The recorded information has been reviewed and is accurate.    Disposition: Admit  Condition: Stable    [name], MD 11/25/15 [phone]

## 2015-11-25 ENCOUNTER — Encounter (HOSPITAL_COMMUNITY): Payer: Self-pay | Admitting: Internal Medicine

## 2015-11-25 ENCOUNTER — Ambulatory Visit: Payer: BLUE CROSS/BLUE SHIELD | Admitting: Gastroenterology

## 2015-11-25 DIAGNOSIS — R7989 Other specified abnormal findings of blood chemistry: Secondary | ICD-10-CM

## 2015-11-25 DIAGNOSIS — F419 Anxiety disorder, unspecified: Secondary | ICD-10-CM

## 2015-11-25 DIAGNOSIS — R079 Chest pain, unspecified: Secondary | ICD-10-CM | POA: Diagnosis present

## 2015-11-25 DIAGNOSIS — R000 Tachycardia, unspecified: Secondary | ICD-10-CM

## 2015-11-25 DIAGNOSIS — R778 Other specified abnormalities of plasma proteins: Secondary | ICD-10-CM

## 2015-11-25 DIAGNOSIS — F411 Generalized anxiety disorder: Secondary | ICD-10-CM

## 2015-11-25 LAB — CBC
HEMATOCRIT: 39.8 % (ref 36.0–46.0)
Hemoglobin: 13 g/dL (ref 12.0–15.0)
MCH: 30.6 pg (ref 26.0–34.0)
MCHC: 32.7 g/dL (ref 30.0–36.0)
MCV: 93.6 fL (ref 78.0–100.0)
Platelets: 356 10*3/uL (ref 150–400)
RBC: 4.25 MIL/uL (ref 3.87–5.11)
RDW: 13.3 % (ref 11.5–15.5)
WBC: 13.2 10*3/uL — ABNORMAL HIGH (ref 4.0–10.5)

## 2015-11-25 LAB — RAPID URINE DRUG SCREEN, HOSP PERFORMED
Amphetamines: NOT DETECTED
BENZODIAZEPINES: POSITIVE — AB
Barbiturates: NOT DETECTED
COCAINE: NOT DETECTED
Opiates: POSITIVE — AB
Tetrahydrocannabinol: NOT DETECTED

## 2015-11-25 LAB — TROPONIN I: Troponin I: <0.03 ng/mL (ref <0.03)

## 2015-11-25 LAB — CREATININE, SERUM
Creatinine, Ser: 1.08 mg/dL — ABNORMAL HIGH (ref 0.44–1.00)
GFR calc non Af Amer: >60 mL/min (ref >60)

## 2015-11-25 LAB — MRSA PCR SCREENING: MRSA BY PCR: NEGATIVE

## 2015-11-25 LAB — TSH: TSH: 2.292 u[IU]/mL (ref 0.350–4.500)

## 2015-11-25 MED ORDER — ATORVASTATIN CALCIUM 20 MG PO TABS: 20.0000 mg | ORAL_TABLET | Freq: Every day | ORAL | Status: DC

## 2015-11-25 MED ORDER — LEVETIRACETAM ER 500 MG PO TB24
500.0000 mg | ORAL_TABLET | Freq: Every day | ORAL | Status: DC
  Administered 2015-11-25: 500 mg via ORAL
  Filled 2015-11-25 (×3): qty 1

## 2015-11-25 MED ORDER — ASPIRIN EC 81 MG PO TBEC
81.0000 mg | DELAYED_RELEASE_TABLET | Freq: Every day | ORAL | Status: DC
  Administered 2015-11-26: 81 mg via ORAL
  Filled 2015-11-25: qty 1

## 2015-11-25 MED ORDER — ENOXAPARIN SODIUM 40 MG/0.4ML ~~LOC~~ SOLN
40.0000 mg | SUBCUTANEOUS | Status: DC
  Administered 2015-11-25 – 2015-11-26 (×2): 40 mg via SUBCUTANEOUS
  Filled 2015-11-25 (×2): qty 0.4

## 2015-11-25 MED ORDER — ACETAMINOPHEN 325 MG PO TABS: 650.0000 mg | ORAL_TABLET | ORAL | Status: DC | PRN

## 2015-11-25 MED ORDER — GI COCKTAIL ~~LOC~~
ORAL | Status: AC
  Filled 2015-11-25: qty 30

## 2015-11-25 MED ORDER — NITROGLYCERIN 2 % TD OINT
1.0000 [in_us] | TOPICAL_OINTMENT | Freq: Once | TRANSDERMAL | Status: AC
  Administered 2015-11-25: 1 [in_us] via TOPICAL
  Filled 2015-11-25: qty 1

## 2015-11-25 MED ORDER — GI COCKTAIL ~~LOC~~
30.0000 mL | Freq: Once | ORAL | Status: AC
  Administered 2015-11-25: 30 mL via ORAL

## 2015-11-25 MED ORDER — ONDANSETRON HCL 4 MG/2ML IJ SOLN
INTRAMUSCULAR | Status: AC
  Filled 2015-11-25: qty 2

## 2015-11-25 MED ORDER — DOCUSATE SODIUM 100 MG PO CAPS
100.0000 mg | ORAL_CAPSULE | Freq: Two times a day (BID) | ORAL | Status: DC
  Administered 2015-11-25 – 2015-11-26 (×3): 100 mg via ORAL
  Filled 2015-11-25 (×3): qty 1

## 2015-11-25 MED ORDER — PROMETHAZINE HCL 25 MG RE SUPP: 25.0000 mg | Freq: Four times a day (QID) | RECTAL | Status: DC | PRN

## 2015-11-25 MED ORDER — ACETAMINOPHEN 325 MG PO TABS
650.0000 mg | ORAL_TABLET | ORAL | Status: DC | PRN
  Administered 2015-11-25: 650 mg via ORAL
  Filled 2015-11-25: qty 2

## 2015-11-25 MED ORDER — ASPIRIN EC 325 MG PO TBEC
325.0000 mg | DELAYED_RELEASE_TABLET | Freq: Every day | ORAL | Status: DC
  Administered 2015-11-25: 325 mg via ORAL
  Filled 2015-11-25: qty 1

## 2015-11-25 MED ORDER — HYDROXYZINE HCL 25 MG PO TABS
25.0000 mg | ORAL_TABLET | ORAL | Status: DC | PRN
  Administered 2015-11-25: 25 mg via ORAL
  Filled 2015-11-25: qty 1

## 2015-11-25 MED ORDER — SODIUM CHLORIDE 0.9 % IV SOLN
INTRAVENOUS | Status: DC
  Administered 2015-11-25: 06:00:00 via INTRAVENOUS

## 2015-11-25 MED ORDER — HYDROXYZINE HCL 25 MG PO TABS
25.0000 mg | ORAL_TABLET | Freq: Once | ORAL | Status: AC
Start: 2015-11-25 — End: 2015-11-25
  Administered 2015-11-25: 25 mg via ORAL
  Filled 2015-11-25: qty 1

## 2015-11-25 MED ORDER — ONDANSETRON HCL 4 MG/2ML IJ SOLN
4.0000 mg | Freq: Four times a day (QID) | INTRAMUSCULAR | Status: DC | PRN
  Administered 2015-11-25 (×2): 4 mg via INTRAVENOUS
  Filled 2015-11-25 (×2): qty 2

## 2015-11-25 MED ORDER — ONDANSETRON HCL 4 MG/2ML IJ SOLN
4.0000 mg | INTRAMUSCULAR | Status: DC | PRN
  Administered 2015-11-25: 4 mg via INTRAVENOUS

## 2015-11-25 MED ORDER — BUTALBITAL-APAP-CAFFEINE 50-325-40 MG PO TABS
1.0000 | ORAL_TABLET | ORAL | Status: DC | PRN
  Administered 2015-11-25 – 2015-11-26 (×5): 1 via ORAL
  Filled 2015-11-25 (×6): qty 1

## 2015-11-25 MED ORDER — SODIUM CHLORIDE 0.9 % IV SOLN
INTRAVENOUS | Status: DC
  Administered 2015-11-25: 21:00:00 via INTRAVENOUS

## 2015-11-25 NOTE — ED Notes (Signed)
Dr. Hal Hope re-paged, pending call back

## 2015-11-25 NOTE — Progress Notes (Signed)
[facility] TEAM 1 - Stepdown/ICU TEAM  Laura Simmons  [id number] DOB: 15-Feb-1975 DOA: 11/24/2015 PCP: Betty Martinique, MD    Brief Narrative:  40 y.o. female with history of anxiety, hyperlipidemia and ongoing tobacco abuse who presented to the ER because of L sided pleuritic chest pain and palpitations for 2 days. In the ER patient was found to be tachycardic and initial troponin was negative but the subsequent one was positive. EKG noted nonspecific findings.  Patient was recently tapered off Xanax w/ the last dose 3 days prior to this admit. Patient used to be on Xanax for last 7 years. Patient also was taken off Ritalin last month.   Subjective: Pt is seen for a f/u visit.  She tells me she feels her heart racing.  Her HR on tele is 84bpm.  She is regular on auscultation.    Assessment & Plan:  Chest pain ?withdrawal from Xanax - trending troponin - TTE pending - d-dimer negative   History of anxiety off Xanax 3 days - DO NOT USE BENZOs  Hyperlipidemia on Lipitor  History of migraine headaches on Keppra  History of endometriosis being followed up by Gynecologist  Tobacco abuse tobacco cessation counseling   DVT prophylaxis: lovenox  Code Status: FULL CODE Family Communication: no family present at time of exam  Disposition Plan:   Consultants:  none  Procedures: none  Antimicrobials:  none   Objective: Blood pressure 120/75, pulse 81, temperature 97.4 F (36.3 C), temperature source Oral, resp. rate 16, height 5\' 4"  (1.626 m), weight 62.8 kg (138 lb 7.2 oz), last menstrual period 11/22/2015, SpO2 98 %.  Intake/Output Summary (Last 24 hours) at 11/25/15 1107 Last data filed at 11/25/15 0800  Gross per 24 hour  Intake          1166.25 ml  Output              325 ml  Net           841.25 ml   Filed Weights   11/24/15 1702 11/25/15 0219  Weight: 61.2 kg (135 lb) 62.8 kg (138 lb 7.2 oz)    Examination: Pt was seen for a f/u visit.     CBC:  Recent Labs Lab 11/24/15 1830 11/25/15 0544  WBC 16.3* 13.2*  NEUTROABS 11.6*  --   HGB 15.7* 13.0  HCT 44.4 39.8  MCV 91.4 93.6  PLT 420* [id number]   Basic Metabolic Panel:  Recent Labs Lab 11/24/15 1830 11/25/15 0544  NA 138  --   K 3.6  --   CL 108  --   CO2 17*  --   GLUCOSE 98  --   BUN 14  --   CREATININE 1.02* 1.08*  CALCIUM 9.7  --    GFR: Estimated Creatinine Clearance: 59.2 mL/min (by C-G formula based on SCr of 1.08 mg/dL (H)).  Liver Function Tests:  Recent Labs Lab 11/24/15 1830  AST 15  ALT 12*  ALKPHOS 44  BILITOT 0.5  PROT 8.0  ALBUMIN 4.6    Cardiac Enzymes:  Recent Labs Lab 11/24/15 1830 11/24/15 2150 11/25/15 0544  TROPONINI <0.03 0.03* <0.03    Recent Results (from the past 240 hour(s))  MRSA PCR Screening     Status: None   Collection Time: 11/25/15  2:22 AM  Result Value Ref Range Status   MRSA by PCR NEGATIVE NEGATIVE Final    Comment:        The GeneXpert MRSA Assay (FDA approved for  NASAL specimens only), is one component of a comprehensive MRSA colonization surveillance program. It is not intended to diagnose MRSA infection nor to guide or monitor treatment for MRSA infections.      Scheduled Meds: . aspirin EC  325 mg Oral Daily  . atorvastatin  20 mg Oral q1800  . docusate sodium  100 mg Oral BID  . enoxaparin (LOVENOX) injection  40 mg Subcutaneous Q24H   Continuous Infusions: . sodium chloride 75 mL/hr at 11/25/15 0547     LOS: 0 days   Time spent: No Charge  Cherene Altes, MD [facility] Office  [phone] Pager - Text Page per Shea Evans as per below:  On-Call/Text Page:      [email or web address]      password [email or web address]  If 7PM-7AM, please contact night-coverage [email or web address] Password [email or web address] 11/25/2015, 11:07 AM

## 2015-11-25 NOTE — H&P (Signed)
History and Physical    Laura Simmons [id number] DOB: Jul 05, 1975 DOA: 11/24/2015  PCP: Betty Martinique, MD  Patient coming from: Home.  Chief Complaint: Chest pain.  HPI: Laura Simmons is a 40 y.o. female with history of anxiety, hyperlipidemia and ongoing tobacco abuse presents to the ER because of chest pain and palpitations. Patient has been having these symptoms for last 2 days. Pain is mostly on the left side of her chest on the axillary line with increase on deep inspiration. Denies any associated productive cough fever chills. In the ER patient was found to be tachycardic and initial troponin was negative but the subsequent one was positive. EKG was showing nonspecific findings. Patient has been admitted for further management of chest pain.  Patient was recently tapered off Xanax and the last dose was 3 days ago. Patient used to be on Xanax for last 7 years. Patient also was taken off Ritalin last month. In addition patient was recently started on Lipitor for hyperlipidemia. Patient states she also has been recently to a neurologist who started the patient on Keppra for headaches. On my exam patient is presently largely chest pain-free and not in distress. Chest pain improved with rest.  Patient recently had IUD placement for endometriosis and was complaining of some pelvic pain and CT abdomen and pelvis was unremarkable. As per the ER physician pelvic exam only showed mild peri-discharge but otherwise IUD was in place.   ED Course: Troponin were mildly positive. D-dimer was negative. Chest x-ray unremarkable. EKG shows normal sinus rhythm.  Review of Systems: As per HPI, rest all negative.   Past Medical History:  Diagnosis Date  . ADHD (attention deficit hyperactivity disorder)   . Agoraphobia   . Bipolar 1 disorder ([place])    patient denies  . Chronic pain    just migraines per patient  . Endometriosis determined by laparoscopy   . GERD (gastroesophageal reflux disease)     . High platelet count ([place])   . Migraine   . Personality disorder   . Right adrenal mass ([place])    per ct 09-06-2015  . Right ovarian cyst   . Vaginal Pap smear, abnormal     Past Surgical History:  Procedure Laterality Date  . COLONOSCOPY  2012  . HYSTEROSCOPY W/D&C  10/23/2015   Procedure: DILATATION AND CURETTAGE /HYSTEROSCOPY;  Surgeon: [name], MD;  Location: [facility];  Service: Gynecology;;  . INTRAUTERINE DEVICE (IUD) INSERTION N/A 10/23/2015   Procedure: INTRAUTERINE DEVICE (IUD) INSERTION with dilation of cervix with ultrasound guidance;  Surgeon: [name], MD;  Location: [facility];  Service: Gynecology;  Laterality: N/A;  . LAPAROSCOPIC OVARIAN CYSTECTOMY Right 09/19/2015   Procedure: LAPAROSCOPIC OVARIAN CYSTECTOMY; CAUTERIZATION OF ENDOMETRIAL LESIONS; ENDOMETRIAL CURRETINGS;  Surgeon: [name], MD;  Location: [facility];  Service: Gynecology;  Laterality: Right;     reports that she has been smoking Cigarettes.  She has a 10.00 pack-year smoking history. She has never used smokeless tobacco. She reports that she drinks alcohol. She reports that she does not use drugs.  Allergies  Allergen Reactions  . Sumatriptan Succinate Anaphylaxis and Other (See Comments)    ALL TRIPTAN MEDS  . Triptans Anaphylaxis and Other (See Comments)    Pt states throat feels as if it is closing.  . Compazine [Prochlorperazine] Other (See Comments)    lockjaw  . Ketamine Other (See Comments)    "Pt. Reports she never wants again." major hallucinations  . Reglan [  Metoclopramide] Other (See Comments)    "crawling out of my skin"    Family History  Problem Relation Age of Onset  . Migraines Mother   . Diabetes Maternal Grandmother   . Stroke Maternal Grandfather   . Heart attack Paternal Grandfather     Prior to Admission medications   Medication Sig Start Date End Date Taking? Authorizing Provider  ALPRAZolam Duanne Moron) 0.5 MG tablet Take 1  tablet by mouth twice a day for 2 weeks. 11/01/15   Betty G Martinique, MD  ALPRAZolam Duanne Moron) 1 MG tablet Take 1/2-1 tablet by mouth twice a day as needed. Patient taking differently: Take 0.5-1 mg by mouth 2 (two) times daily as needed for anxiety. Take 1/2-1 tablet by mouth twice a day as needed. 10/02/15   Betty G Martinique, MD  Aspirin-Salicylamide-Caffeine [facility] HEADACHE POWDER PO) Take by mouth as needed.    Historical Provider, MD  atorvastatin (LIPITOR) 20 MG tablet Take 1 tablet (20 mg total) by mouth daily. 10/18/15   Betty G Martinique, MD  butalbital-acetaminophen-caffeine (FIORICET WITH CODEINE) [phone] MG capsule Take 1 capsule by mouth every 12 (twelve) hours as needed for headache (No more than 3 times per week.). 08/19/15   Betty G Martinique, MD  butalbital-aspirin-caffeine [facility] [phone] MG capsule Take 1 capsule by mouth 2 (two) times daily as needed for headache. No more than 2 caps per week. 10/18/15   Betty G Martinique, MD  calcium carbonate (TUMS) 500 MG chewable tablet Chew 1 tablet by mouth as needed for indigestion or heartburn.    Historical Provider, MD  cyclobenzaprine (FLEXERIL) 5 MG tablet Take 1 tablet as needed at bedtime for severe headache. 09/03/15   Alda Berthold, DO  dimenhyDRINATE (DRAMAMINE) 50 MG tablet Take 50 mg by mouth at bedtime as needed for nausea.    Historical Provider, MD  docusate sodium (COLACE) 100 MG capsule Take 1 capsule (100 mg total) by mouth 2 (two) times daily. 10/23/15   [name], MD  ketorolac (TORADOL) 10 MG tablet Take 1 tablet (10 mg total) by mouth every 6 (six) hours as needed. 10/23/15   [name], MD  methylphenidate (RITALIN) 10 MG tablet Take 10 mg by mouth 3 times daily. 10/02/15   Betty G Martinique, MD  ondansetron (ZOFRAN ODT) 4 MG disintegrating tablet Take 1 tablet (4 mg total) by mouth every 8 (eight) hours as needed for nausea. 04/02/15   Tanna Furry, MD    Physical Exam: Vitals:   11/25/15 0130 11/25/15 0219  11/25/15 0300 11/25/15 0400  BP: 119/79 121/79 105/72 100/74  Pulse: 94  94 92  Resp: 12  16 (!) 21  Temp:  99.2 F (37.3 C)  98.6 F (37 C)  TempSrc:  Oral  Oral  SpO2: 97%  97% 96%  Weight:  62.8 kg (138 lb 7.2 oz)    Height:  5\' 4"  (1.626 m)        Constitutional: Moderately built and nourished. Vitals:   11/25/15 0130 11/25/15 0219 11/25/15 0300 11/25/15 0400  BP: 119/79 121/79 105/72 100/74  Pulse: 94  94 92  Resp: 12  16 (!) 21  Temp:  99.2 F (37.3 C)  98.6 F (37 C)  TempSrc:  Oral  Oral  SpO2: 97%  97% 96%  Weight:  62.8 kg (138 lb 7.2 oz)    Height:  5\' 4"  (1.626 m)     Eyes: Anicteric no pallor. ENMT: No discharge from the ears eyes nose and  mouth. Neck: No mass felt. No neck rigidity. No JVD appreciated. Respiratory: No rhonchi or crepitations. Cardiovascular: S1 and S2 heard. No murmurs appreciated. Abdomen: Soft nontender bowel sounds present. No guarding or rigidity. Musculoskeletal: No edema. No joint effusion. Skin: No rash. Skin appears warm. Neurologic: Alert awake oriented to time place and person. Moves all extremities. Psychiatric: Appears normal. Normal affect.   Labs on Admission: I have personally reviewed following labs and imaging studies  CBC:  Recent Labs Lab 11/24/15 1830  WBC 16.3*  NEUTROABS 11.6*  HGB 15.7*  HCT 44.4  MCV 91.4  PLT 0000000*   Basic Metabolic Panel:  Recent Labs Lab 11/24/15 1830  NA 138  K 3.6  CL 108  CO2 17*  GLUCOSE 98  BUN 14  CREATININE 1.02*  CALCIUM 9.7   GFR: Estimated Creatinine Clearance: 62.7 mL/min (by C-G formula based on SCr of 1.02 mg/dL (H)). Liver Function Tests:  Recent Labs Lab 11/24/15 1830  AST 15  ALT 12*  ALKPHOS 44  BILITOT 0.5  PROT 8.0  ALBUMIN 4.6   No results for input(s): LIPASE, AMYLASE in the last 168 hours. No results for input(s): AMMONIA in the last 168 hours. Coagulation Profile: No results for input(s): INR, PROTIME in the last 168 hours. Cardiac  Enzymes:  Recent Labs Lab 11/24/15 1830 11/24/15 2150  TROPONINI <0.03 0.03*   BNP (last 3 results) No results for input(s): PROBNP in the last 8760 hours. HbA1C: No results for input(s): HGBA1C in the last 72 hours. CBG: No results for input(s): GLUCAP in the last 168 hours. Lipid Profile: No results for input(s): CHOL, HDL, LDLCALC, TRIG, CHOLHDL, LDLDIRECT in the last 72 hours. Thyroid Function Tests: No results for input(s): TSH, T4TOTAL, FREET4, T3FREE, THYROIDAB in the last 72 hours. Anemia Panel: No results for input(s): VITAMINB12, FOLATE, FERRITIN, TIBC, IRON, RETICCTPCT in the last 72 hours. Urine analysis:    Component Value Date/Time   COLORURINE YELLOW 11/24/2015 1815   APPEARANCEUR CLEAR 11/24/2015 1815   LABSPEC 1.015 11/24/2015 1815   PHURINE 5.0 11/24/2015 1815   GLUCOSEU NEGATIVE 11/24/2015 1815   HGBUR NEGATIVE 11/24/2015 1815   BILIRUBINUR NEGATIVE 11/24/2015 1815   BILIRUBINUR n 10/07/2015 1010   KETONESUR NEGATIVE 11/24/2015 1815   PROTEINUR NEGATIVE 11/24/2015 1815   UROBILINOGEN 0.2 10/07/2015 1010   UROBILINOGEN 1.0 01/19/2013 1501   NITRITE NEGATIVE 11/24/2015 1815   LEUKOCYTESUR NEGATIVE 11/24/2015 1815   Sepsis Labs: @LABRCNTIP (procalcitonin:4,lacticidven:4) )No results found for this or any previous visit (from the past 240 hour(s)).   Radiological Exams on Admission: Dg Chest 2 View  Result Date: 11/24/2015 CLINICAL DATA:  Chest pain, shortness breath common nausea for 2 days. EXAM: CHEST  2 VIEW COMPARISON:  01/19/2013 FINDINGS: The heart size and mediastinal contours are within normal limits. Both lungs are clear. The visualized skeletal structures are unremarkable. IMPRESSION: No active cardiopulmonary disease. Electronically Signed   By: Earle Gell M.D.   On: 11/24/2015 19:04   Ct Abdomen Pelvis W Contrast  Result Date: 11/25/2015 CLINICAL DATA:  Low abdominal pain radiating to left inguinal region. Associated vaginal bleeding  increase in the bright red blood over the past few days. History of ovarian cystectomy, IUD last month. Right adrenal mass. EXAM: CT ABDOMEN AND PELVIS WITH CONTRAST TECHNIQUE: Multidetector CT imaging of the abdomen and pelvis was performed using the standard protocol following bolus administration of intravenous contrast. CONTRAST:  170mL ISOVUE-300 IOPAMIDOL (ISOVUE-300) INJECTION 61% COMPARISON:  09/06/2015 and 04/02/2015 CT FINDINGS: Lower chest:  No acute abnormality. Hepatobiliary: No focal liver abnormality is seen. No gallstones, gallbladder wall thickening, or biliary dilatation. Pancreas: Unremarkable. No pancreatic ductal dilatation or surrounding inflammatory changes. Spleen: Normal in size without focal abnormality. Adrenals/Urinary Tract: Stable 13 mm hypodense nodule in the right adrenal gland. By Hounsfield unit of 27, this is consistent with an adenoma. Left adrenal gland is unremarkable. Kidneys are normal, without renal calculi, focal lesion, or hydronephrosis. Bladder is unremarkable. Stomach/Bowel: No small bowel dilatation. Increased colonic stool burden without inflammation. Appendix is again not visualized but no pericecal inflammation is identified. Vascular/Lymphatic: No significant vascular findings are present. No enlarged abdominal or pelvic lymph nodes. Reproductive: IUD seen in the expected location of the endometrial cavity. 3.2 x 2.7 cm adnexal cyst on the right is smaller than on prior recent comparison port measured 5.2 x 3.7 cm. Other: No abdominal wall hernia or abnormality. No abdominopelvic ascites. Musculoskeletal: Choose IMPRESSION: Smaller appearing right adnexal simple appearing cyst canal estimated at 3.2 x 2.7 versus 5.2 x 3.7 cm previously. Increased colonic stool burden without acute bowel obstruction or inflammation. Findings suggest constipation. Stable 13 mm hypodense lesion of the right adrenal gland. Electronically Signed   By: Ashley Royalty M.D.   On: 11/25/2015  00:06    EKG: Independently reviewed. Normal sinus rhythm.  Assessment/Plan Principal Problem:   Chest pain Active Problems:   Tachycardia   Elevated troponin    1. Chest pain - could be from withdrawal from Xanax. Patient has known history of tobacco abuse and recently diagnosed hyperlipidemia. We will cycle cardiac markers to make sure patient's troponin is not showing raising trends. Check 2-D echo. Patient is on aspirin. Patient's heart rate has improved without any intervention. Will check TSH. 2. History of anxiety presently off Xanax the last 3 days. Patient was initially tachycardic but improved without any intervention. Closely observe. Check urine drug screen. 3. Hyperlipidemia on Lipitor. 4. History of migraine headaches on Keppra. Dose of which is not known, patient will be bringing her home medication dose in the morning. 5. History of endometriosis being followed up by gynecologist. 6. Tobacco abuse - tobacco cessation counseling requested.   DVT prophylaxis: Lovenox. Code Status: Full code.  Family Communication: Patient's mother at the bedside.  Disposition Plan: Home.  Consults called: None.  Admission status: Observation.    Rise Patience MD [facility] Pager [phone].  If 7PM-7AM, please contact night-coverage [email or web address] Password [email or web address]  11/25/2015, 5:05 AM

## 2015-11-25 NOTE — ED Notes (Signed)
3E declined receiving pt d/t active 4/10 CP, Dr. Elmer Picker paged, bed control and EDP notified.

## 2015-11-25 NOTE — ED Notes (Signed)
Pt upgraded to SD per DR. Hal Hope, SD bed initiated by Dr. Leonette Monarch, bed control and carelink notified, Carelink crew present at [facility] pending new bed assignment.

## 2015-11-25 NOTE — Progress Notes (Signed)
Patient is projectile vomiting, zofran given @ 1500 when pt complained of nausea, paged Dr. Thereasa Solo, order to give zofran, zofran given, patient states she does better with phenergan and has no allergy to phenergan, Patient said she will see if zofran helps if not will try phenergan. Will continue to monitor closely.

## 2015-11-26 ENCOUNTER — Other Ambulatory Visit (HOSPITAL_COMMUNITY): Payer: BLUE CROSS/BLUE SHIELD

## 2015-11-26 ENCOUNTER — Observation Stay (HOSPITAL_BASED_OUTPATIENT_CLINIC_OR_DEPARTMENT_OTHER): Payer: BLUE CROSS/BLUE SHIELD

## 2015-11-26 DIAGNOSIS — R000 Tachycardia, unspecified: Secondary | ICD-10-CM | POA: Diagnosis not present

## 2015-11-26 DIAGNOSIS — K589 Irritable bowel syndrome without diarrhea: Secondary | ICD-10-CM | POA: Diagnosis not present

## 2015-11-26 DIAGNOSIS — F411 Generalized anxiety disorder: Secondary | ICD-10-CM | POA: Diagnosis not present

## 2015-11-26 DIAGNOSIS — R748 Abnormal levels of other serum enzymes: Secondary | ICD-10-CM

## 2015-11-26 DIAGNOSIS — R079 Chest pain, unspecified: Secondary | ICD-10-CM

## 2015-11-26 DIAGNOSIS — R002 Palpitations: Secondary | ICD-10-CM | POA: Diagnosis not present

## 2015-11-26 DIAGNOSIS — F410 Panic disorder [episodic paroxysmal anxiety] without agoraphobia: Secondary | ICD-10-CM

## 2015-11-26 DIAGNOSIS — R778 Other specified abnormalities of plasma proteins: Secondary | ICD-10-CM | POA: Diagnosis not present

## 2015-11-26 LAB — COMPREHENSIVE METABOLIC PANEL
ALK PHOS: 40 U/L (ref 38–126)
ALT: 10 U/L — ABNORMAL LOW (ref 14–54)
ANION GAP: 8 (ref 5–15)
AST: 13 U/L — ABNORMAL LOW (ref 15–41)
Albumin: 3.4 g/dL — ABNORMAL LOW (ref 3.5–5.0)
BILIRUBIN TOTAL: 0.4 mg/dL (ref 0.3–1.2)
BUN: 6 mg/dL (ref 6–20)
CALCIUM: 8.8 mg/dL — AB (ref 8.9–10.3)
CO2: 23 mmol/L (ref 22–32)
Chloride: 110 mmol/L (ref 101–111)
Creatinine, Ser: 0.92 mg/dL (ref 0.44–1.00)
GFR calc Af Amer: >60 mL/min (ref >60)
Glucose, Bld: 81 mg/dL (ref 65–99)
POTASSIUM: 3.6 mmol/L (ref 3.5–5.1)
Sodium: 141 mmol/L (ref 135–145)
TOTAL PROTEIN: 6 g/dL — AB (ref 6.5–8.1)

## 2015-11-26 LAB — CBC
HEMATOCRIT: 35.9 % — AB (ref 36.0–46.0)
HEMOGLOBIN: 12.3 g/dL (ref 12.0–15.0)
MCH: 31.7 pg (ref 26.0–34.0)
MCHC: 34.3 g/dL (ref 30.0–36.0)
MCV: 92.5 fL (ref 78.0–100.0)
Platelets: 308 10*3/uL (ref 150–400)
RBC: 3.88 MIL/uL (ref 3.87–5.11)
RDW: 13.1 % (ref 11.5–15.5)
WBC: 9.1 10*3/uL (ref 4.0–10.5)

## 2015-11-26 LAB — ECHOCARDIOGRAM COMPLETE
HEIGHTINCHES: 64 in
WEIGHTICAEL: 2132.8 [oz_av]

## 2015-11-26 MED ORDER — HYDROXYZINE HCL 50 MG PO TABS: 50.0000 mg | ORAL_TABLET | Freq: Four times a day (QID) | ORAL | 0 refills | Status: DC | PRN

## 2015-11-26 MED ORDER — HYDROXYZINE HCL 25 MG PO TABS
25.0000 mg | ORAL_TABLET | Freq: Three times a day (TID) | ORAL | Status: DC
  Administered 2015-11-26 (×2): 25 mg via ORAL
  Filled 2015-11-26 (×2): qty 1

## 2015-11-26 MED ORDER — IBUPROFEN 600 MG PO TABS
600.0000 mg | ORAL_TABLET | Freq: Four times a day (QID) | ORAL | Status: DC | PRN
  Administered 2015-11-26: 600 mg via ORAL
  Filled 2015-11-26: qty 1

## 2015-11-26 NOTE — Clinical Social Work Note (Signed)
CSW met with patient. Mother at bedside. Patient gave permission to speak in front of her mother. Per MD via [facility], patient in need of outpatient mental health resources. Resources provided within 10 miles of her home. Patient expressed no further needs.  CSW signing off. Consult again if any social work needs arise.  Laura Simmons, [place]

## 2015-11-26 NOTE — Progress Notes (Addendum)
Pt refused to have IV fluid, according to her, it is bothering her a lot to sleep, so RN stopped it and informed MD also. One time dose of Atarax 25mg  po given for the complain of anxiety, tab Fioricet also provided for headache, will continue to monitor,

## 2015-11-26 NOTE — Progress Notes (Signed)
Pt has orders to be discharged. Discharge instructions given and pt has no additional questions at this time. Medication regimen reviewed and pt educated. Pt verbalized understanding and has no additional questions. Telemetry box removed. IV removed and site in good condition. Pt stable and ambulated off of floor; declined to be escorted via wheelchair.

## 2015-11-26 NOTE — Progress Notes (Signed)
  Echocardiogram 2D Echocardiogram has been performed.  Laura Simmons 11/26/2015, 1:35 PM

## 2015-11-26 NOTE — Discharge Summary (Signed)
Physician Discharge Summary  Laura Simmons [id number] DOB: Jun 07, 1975 DOA: 11/24/2015  PCP: Betty Martinique, MD  Admit date: 11/24/2015 Discharge date: 11/26/2015  Admitted From: Home Disposition: Home   Recommendations for Outpatient Follow-up:  1. To establish care at [facility] for counseling and psychiatry services for treatment of GAD with panic disorder and possibly PTSD.  2. No benzodiazepines were provided during this hospitalization or at discharge, though this patient may benefit from these high risk medications under the care of a continuity provider.   Home Health: None Equipment/Devices: None  Discharge Condition: Stable CODE STATUS: Full Diet recommendation: Regular  Brief/Interim Summary: Laura Simmons is a 40 y.o. female with history of anxiety, hyperlipidemia and ongoing tobacco use who presented to the ED 11/5 with chest pain associated with panic attack. She had recently been weaned off benzodiazepines.   She has taken xanax for 7 years, prescribed by her PCP in [place], [place] where she was living with her fiance at the time of his suicide by hanging in March 2017. She has experienced worsening of anxiety symptoms requiring increased dose to 1mg  TID since that time. She moved back near [place] to be with her mother and ran out of medication. When she called for a taper, she was given 0.5mg  BID and finished these just prior to arrival. She had also been taken off ritalin in the past month.   She was admitted for ACS rule out. ECG was NSR, troponin peak was 0.03, with 3 undetectable levels following that. DDimer negative. Echocardiogram was normal. She has a history of endometriosis treated with mirena IUD and was experiencing menstrual cramping. CT abdomen and pelvis showed a right adnexal cyst and a stable hypodense lesion on the right adrenal gland. UDS +benzodiazepines and opiates. She was visibly anxious without distress during hospitalization, reporting limited  benefit with hydroxyzine 25mg  and is discharged on hydroxyzine 50mg  po prn. Care management has provided resources regarding establishment with PCP and psychiatry.   Discharge Diagnoses:  Principal Problem:   Chest pain Active Problems:   Tachycardia   Elevated troponin   Anxiety state  Discharge Instructions Discharge Instructions    Discharge instructions    Complete by:  As directed    You were admitted for chest pain due to panic attack. A thorough evaluation of your heart is completely normal. To treat these in the short term take atarax 50mg  as needed, this has been sent to your pharmacy. It is very important for you to be under the care of a therapist and psychiatrist to treat your anxiety disorder, panic disorder, and PTSD.       Medication List    TAKE these medications   ALPRAZolam 1 MG tablet Commonly known as:  XANAX Take 1/2-1 tablet by mouth twice a day as needed. What changed:  how much to take  how to take this  when to take this  reasons to take this  additional instructions   atorvastatin 20 MG tablet Commonly known as:  LIPITOR Take 1 tablet (20 mg total) by mouth daily.   BC HEADACHE POWDER PO Take by mouth as needed.   butalbital-aspirin-caffeine 50-325-40 MG capsule Commonly known as:  FIORINAL Take 1 capsule by mouth 2 (two) times daily as needed for headache. No more than 2 caps per week.   cyclobenzaprine 5 MG tablet Commonly known as:  FLEXERIL Take 1 tablet as needed at bedtime for severe headache.   dimenhyDRINATE 50 MG tablet Commonly known as:  DRAMAMINE Take 50 mg  by mouth at bedtime as needed for nausea.   docusate sodium 100 MG capsule Commonly known as:  COLACE Take 1 capsule (100 mg total) by mouth 2 (two) times daily.   hydrOXYzine 50 MG tablet Commonly known as:  ATARAX/VISTARIL Take 1 tablet (50 mg total) by mouth every 6 (six) hours as needed for anxiety.   levETIRAcetam 500 MG 24 hr tablet Commonly known as:   KEPPRA XR Take 500 mg by mouth at bedtime.   TUMS 500 MG chewable tablet Generic drug:  calcium carbonate Chew 1 tablet by mouth as needed for indigestion or heartburn.      Follow-up Information    Betty Martinique, MD. Schedule an appointment as soon as possible for a visit in 1 week(s).   Specialty:  Family Medicine Contact information: [address] [place] [address] [phone]          Allergies  Allergen Reactions  . Sumatriptan Succinate Anaphylaxis and Other (See Comments)    ALL TRIPTAN MEDS  . Triptans Anaphylaxis and Other (See Comments)    Pt states throat feels as if it is closing.  . Compazine [Prochlorperazine] Other (See Comments)    lockjaw  . Ketamine Other (See Comments)    "Pt. Reports she never wants again." major hallucinations  . Reglan [Metoclopramide] Other (See Comments)    "crawling out of my skin"    Consultations:  Care management  Procedures/Studies: Dg Chest 2 View  Result Date: 11/24/2015 CLINICAL DATA:  Chest pain, shortness breath common nausea for 2 days. EXAM: CHEST  2 VIEW COMPARISON:  01/19/2013 FINDINGS: The heart size and mediastinal contours are within normal limits. Both lungs are clear. The visualized skeletal structures are unremarkable. IMPRESSION: No active cardiopulmonary disease. Electronically Signed   By: Earle Gell M.D.   On: 11/24/2015 19:04   Ct Abdomen Pelvis W Contrast  Result Date: 11/25/2015 CLINICAL DATA:  Low abdominal pain radiating to left inguinal region. Associated vaginal bleeding increase in the bright red blood over the past few days. History of ovarian cystectomy, IUD last month. Right adrenal mass. EXAM: CT ABDOMEN AND PELVIS WITH CONTRAST TECHNIQUE: Multidetector CT imaging of the abdomen and pelvis was performed using the standard protocol following bolus administration of intravenous contrast. CONTRAST:  165mL ISOVUE-300 IOPAMIDOL (ISOVUE-300) INJECTION 61% COMPARISON:  09/06/2015 and  04/02/2015 CT FINDINGS: Lower chest: No acute abnormality. Hepatobiliary: No focal liver abnormality is seen. No gallstones, gallbladder wall thickening, or biliary dilatation. Pancreas: Unremarkable. No pancreatic ductal dilatation or surrounding inflammatory changes. Spleen: Normal in size without focal abnormality. Adrenals/Urinary Tract: Stable 13 mm hypodense nodule in the right adrenal gland. By Hounsfield unit of 27, this is consistent with an adenoma. Left adrenal gland is unremarkable. Kidneys are normal, without renal calculi, focal lesion, or hydronephrosis. Bladder is unremarkable. Stomach/Bowel: No small bowel dilatation. Increased colonic stool burden without inflammation. Appendix is again not visualized but no pericecal inflammation is identified. Vascular/Lymphatic: No significant vascular findings are present. No enlarged abdominal or pelvic lymph nodes. Reproductive: IUD seen in the expected location of the endometrial cavity. 3.2 x 2.7 cm adnexal cyst on the right is smaller than on prior recent comparison port measured 5.2 x 3.7 cm. Other: No abdominal wall hernia or abnormality. No abdominopelvic ascites. Musculoskeletal: Choose IMPRESSION: Smaller appearing right adnexal simple appearing cyst canal estimated at 3.2 x 2.7 versus 5.2 x 3.7 cm previously. Increased colonic stool burden without acute bowel obstruction or inflammation. Findings suggest constipation. Stable 13 mm hypodense  lesion of the right adrenal gland. Electronically Signed   By: Ashley Royalty M.D.   On: 11/25/2015 00:06    Echo: - Left ventricle: The cavity size was normal. Wall thickness was   normal. Systolic function was normal. The estimated ejection   fraction was in the range of 60% to 65%. Wall motion was normal;   there were no regional wall motion abnormalities. Left   ventricular diastolic function parameters were normal. - Mitral valve: There was trivial regurgitation.  Subjective: Pt anxious without  dyspnea or chest pain.   Discharge Exam: Vitals:   11/26/15 1001 11/26/15 1357  BP: 127/80 125/80  Pulse: 86   Resp: 18   Temp: 98.2 F (36.8 C)    Vitals:   11/26/15 0037 11/26/15 0603 11/26/15 1001 11/26/15 1357  BP: 133/84 119/77 127/80 125/80  Pulse: 85 87 86   Resp: 18 18 18    Temp: 98.7 F (37.1 C) 99.1 F (37.3 C) 98.2 F (36.8 C)   TempSrc: Oral Oral Oral   SpO2: 100% 97% 100%   Weight:  60.5 kg (133 lb 4.8 oz)    Height:       General: Pt is alert, awake, not in acute distress Cardiovascular: RRR, S1/S2 +, no rubs, no gallops Respiratory: CTA bilaterally, no wheezing, no rhonchi Abdominal: Soft, NT, ND, bowel sounds + Extremities: no edema, no cyanosis  The results of significant diagnostics from this hospitalization (including imaging, microbiology, ancillary and laboratory) are listed below for reference.    Microbiology: Recent Results (from the past 240 hour(s))  MRSA PCR Screening     Status: None   Collection Time: 11/25/15  2:22 AM  Result Value Ref Range Status   MRSA by PCR NEGATIVE NEGATIVE Final    Comment:        The GeneXpert MRSA Assay (FDA approved for NASAL specimens only), is one component of a comprehensive MRSA colonization surveillance program. It is not intended to diagnose MRSA infection nor to guide or monitor treatment for MRSA infections.      Labs: BNP (last 3 results) No results for input(s): BNP in the last 8760 hours. Basic Metabolic Panel:  Recent Labs Lab 11/24/15 1830 11/25/15 0544 11/26/15 0440  NA 138  --  141  K 3.6  --  3.6  CL 108  --  110  CO2 17*  --  23  GLUCOSE 98  --  81  BUN 14  --  6  CREATININE 1.02* 1.08* 0.92  CALCIUM 9.7  --  8.8*   Liver Function Tests:  Recent Labs Lab 11/24/15 1830 11/26/15 0440  AST 15 13*  ALT 12* 10*  ALKPHOS 44 40  BILITOT 0.5 0.4  PROT 8.0 6.0*  ALBUMIN 4.6 3.4*   No results for input(s): LIPASE, AMYLASE in the last 168 hours. No results for input(s):  AMMONIA in the last 168 hours. CBC:  Recent Labs Lab 11/24/15 1830 11/25/15 0544 11/26/15 0440  WBC 16.3* 13.2* 9.1  NEUTROABS 11.6*  --   --   HGB 15.7* 13.0 12.3  HCT 44.4 39.8 35.9*  MCV 91.4 93.6 92.5  PLT 420* 356 308   Cardiac Enzymes:  Recent Labs Lab 11/24/15 1830 11/24/15 2150 11/25/15 0544 11/25/15 1043 11/25/15 1633  TROPONINI <0.03 0.03* <0.03 <0.03 <0.03   BNP: Invalid input(s): POCBNP CBG: No results for input(s): GLUCAP in the last 168 hours. D-Dimer  Recent Labs  11/24/15 1830  DDIMER <0.27   Hgb A1c No results  for input(s): HGBA1C in the last 72 hours. Lipid Profile No results for input(s): CHOL, HDL, LDLCALC, TRIG, CHOLHDL, LDLDIRECT in the last 72 hours. Thyroid function studies  Recent Labs  11/25/15 0544  TSH 2.292   Anemia work up No results for input(s): VITAMINB12, FOLATE, FERRITIN, TIBC, IRON, RETICCTPCT in the last 72 hours. Urinalysis    Component Value Date/Time   COLORURINE YELLOW 11/24/2015 1815   APPEARANCEUR CLEAR 11/24/2015 1815   LABSPEC 1.015 11/24/2015 1815   PHURINE 5.0 11/24/2015 1815   GLUCOSEU NEGATIVE 11/24/2015 1815   HGBUR NEGATIVE 11/24/2015 1815   BILIRUBINUR NEGATIVE 11/24/2015 1815   BILIRUBINUR n 10/07/2015 1010   KETONESUR NEGATIVE 11/24/2015 1815   PROTEINUR NEGATIVE 11/24/2015 1815   UROBILINOGEN 0.2 10/07/2015 1010   UROBILINOGEN 1.0 01/19/2013 1501   NITRITE NEGATIVE 11/24/2015 1815   LEUKOCYTESUR NEGATIVE 11/24/2015 1815   Sepsis Labs Invalid input(s): PROCALCITONIN,  WBC,  LACTICIDVEN Microbiology Recent Results (from the past 240 hour(s))  MRSA PCR Screening     Status: None   Collection Time: 11/25/15  2:22 AM  Result Value Ref Range Status   MRSA by PCR NEGATIVE NEGATIVE Final    Comment:        The GeneXpert MRSA Assay (FDA approved for NASAL specimens only), is one component of a comprehensive MRSA colonization surveillance program. It is not intended to diagnose  MRSA infection nor to guide or monitor treatment for MRSA infections.     Time coordinating discharge: Over 30 minutes  Vance Gather, MD  [facility] 11/26/2015, 3:51 PM Pager [phone]  If 7PM-7AM, please contact night-coverage [email or web address] Password [email or web address]

## 2015-11-28 ENCOUNTER — Telehealth: Payer: Self-pay

## 2015-11-28 NOTE — Telephone Encounter (Signed)
LMTCB

## 2015-12-03 ENCOUNTER — Ambulatory Visit: Payer: BLUE CROSS/BLUE SHIELD | Admitting: Family Medicine

## 2016-01-27 DIAGNOSIS — G43019 Migraine without aura, intractable, without status migrainosus: Secondary | ICD-10-CM | POA: Diagnosis not present

## 2016-01-27 DIAGNOSIS — F419 Anxiety disorder, unspecified: Secondary | ICD-10-CM | POA: Diagnosis not present

## 2016-03-11 DIAGNOSIS — N946 Dysmenorrhea, unspecified: Secondary | ICD-10-CM | POA: Diagnosis not present

## 2016-03-11 DIAGNOSIS — N939 Abnormal uterine and vaginal bleeding, unspecified: Secondary | ICD-10-CM | POA: Diagnosis not present

## 2016-03-13 ENCOUNTER — Telehealth: Payer: Self-pay | Admitting: Family Medicine

## 2016-03-13 NOTE — Telephone Encounter (Signed)
Brief summary of OV from OB/GYN- RLQ abd pain, likely not related to prior cyst. Was given short supply of Percocet with [name] ordered. OB/GYN team likely will not be involved after [name] comes back and there is no cyst involvement.  She has long standing hx of anxiety and depression. Dr. Martinique started weaning her from high dose of Xanax according to notes (a decision I agree with) and she is in the care of a psychiatrist who completed the wean process. She would like to find another according to the note.  She has a hx of being on multiple pain medications and having pain seeking behavior.  She is having b/l UE and LE swelling. Note states it is/was concerning for cardiac disease and that she had mild elevation of troponins in past but it was never followed up with. EKG largely unremarkable. Echo was unremarkable on 11/17.

## 2016-03-20 ENCOUNTER — Ambulatory Visit: Payer: BLUE CROSS/BLUE SHIELD | Admitting: Family Medicine

## 2016-03-20 DIAGNOSIS — R1031 Right lower quadrant pain: Secondary | ICD-10-CM | POA: Diagnosis not present

## 2016-03-20 DIAGNOSIS — Z30432 Encounter for removal of intrauterine contraceptive device: Secondary | ICD-10-CM | POA: Diagnosis not present

## 2016-03-20 DIAGNOSIS — R102 Pelvic and perineal pain: Secondary | ICD-10-CM | POA: Diagnosis not present

## 2016-03-20 DIAGNOSIS — N83209 Unspecified ovarian cyst, unspecified side: Secondary | ICD-10-CM | POA: Diagnosis not present

## 2016-03-25 ENCOUNTER — Encounter: Payer: Self-pay | Admitting: Family Medicine

## 2016-03-25 ENCOUNTER — Ambulatory Visit (INDEPENDENT_AMBULATORY_CARE_PROVIDER_SITE_OTHER): Payer: BLUE CROSS/BLUE SHIELD | Admitting: Family Medicine

## 2016-03-25 VITALS — BP 140/90 | HR 81 | Temp 98.6°F | Resp 16 | Ht 64.0 in | Wt 151.4 lb

## 2016-03-25 DIAGNOSIS — F411 Generalized anxiety disorder: Secondary | ICD-10-CM | POA: Diagnosis not present

## 2016-03-25 DIAGNOSIS — R030 Elevated blood-pressure reading, without diagnosis of hypertension: Secondary | ICD-10-CM

## 2016-03-25 DIAGNOSIS — E7800 Pure hypercholesterolemia, unspecified: Secondary | ICD-10-CM

## 2016-03-25 DIAGNOSIS — R1110 Vomiting, unspecified: Secondary | ICD-10-CM | POA: Diagnosis not present

## 2016-03-25 DIAGNOSIS — IMO0001 Reserved for inherently not codable concepts without codable children: Secondary | ICD-10-CM

## 2016-03-25 MED ORDER — CLONAZEPAM 0.5 MG PO TABS: 0.5000 mg | ORAL_TABLET | Freq: Two times a day (BID) | ORAL | 1 refills | Status: DC | PRN

## 2016-03-25 MED ORDER — PRAVASTATIN SODIUM 40 MG PO TABS: 40.0000 mg | ORAL_TABLET | Freq: Every day | ORAL | 1 refills | Status: DC

## 2016-03-25 NOTE — Progress Notes (Signed)
Pre visit review using our clinic review tool, if applicable. No additional management support is needed unless otherwise documented below in the visit note. 
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
## 2016-03-25 NOTE — Patient Instructions (Addendum)
Around 3 times per week, check your blood pressure 4 times per day. Twice in the morning and twice in the evening. The readings should be at least one minute apart. Write down these values.  When you check your BP, make sure you have been doing something calm/relaxing 5 minutes prior to checking. Both feet should be flat on the floor and you should be sitting. Use your left arm and make sure it is in a relaxed position (on a table), and that the cuff is at the approximate level/height of your heart.  [facility] [address] [place], [place] [address] [place], [place] [address] [phone]  [facility] [address] [place], [place] [address] [phone]  Dr. Sheralyn Boatman [address] [place], [place] [address] [phone]

## 2016-04-02 DIAGNOSIS — M545 Low back pain: Secondary | ICD-10-CM | POA: Diagnosis not present

## 2016-04-02 DIAGNOSIS — N83201 Unspecified ovarian cyst, right side: Secondary | ICD-10-CM | POA: Diagnosis not present

## 2016-04-06 DIAGNOSIS — G4440 Drug-induced headache, not elsewhere classified, not intractable: Secondary | ICD-10-CM | POA: Diagnosis not present

## 2016-04-06 DIAGNOSIS — G43719 Chronic migraine without aura, intractable, without status migrainosus: Secondary | ICD-10-CM | POA: Diagnosis not present

## 2016-04-21 DIAGNOSIS — N83201 Unspecified ovarian cyst, right side: Secondary | ICD-10-CM | POA: Diagnosis not present

## 2016-04-21 DIAGNOSIS — R102 Pelvic and perineal pain: Secondary | ICD-10-CM | POA: Diagnosis not present

## 2016-05-08 DIAGNOSIS — F4310 Post-traumatic stress disorder, unspecified: Secondary | ICD-10-CM | POA: Diagnosis not present

## 2016-05-12 DIAGNOSIS — F4310 Post-traumatic stress disorder, unspecified: Secondary | ICD-10-CM | POA: Diagnosis not present

## 2016-05-19 DIAGNOSIS — K5904 Chronic idiopathic constipation: Secondary | ICD-10-CM | POA: Diagnosis not present

## 2016-05-19 DIAGNOSIS — R1013 Epigastric pain: Secondary | ICD-10-CM | POA: Diagnosis not present

## 2016-05-19 DIAGNOSIS — R1310 Dysphagia, unspecified: Secondary | ICD-10-CM | POA: Diagnosis not present

## 2016-05-19 DIAGNOSIS — R140 Abdominal distension (gaseous): Secondary | ICD-10-CM | POA: Diagnosis not present

## 2016-05-19 DIAGNOSIS — K219 Gastro-esophageal reflux disease without esophagitis: Secondary | ICD-10-CM | POA: Diagnosis not present

## 2016-05-22 DIAGNOSIS — R1013 Epigastric pain: Secondary | ICD-10-CM | POA: Diagnosis not present

## 2016-05-22 DIAGNOSIS — K209 Esophagitis, unspecified: Secondary | ICD-10-CM | POA: Diagnosis not present

## 2016-05-22 DIAGNOSIS — K319 Disease of stomach and duodenum, unspecified: Secondary | ICD-10-CM | POA: Diagnosis not present

## 2016-05-22 DIAGNOSIS — K259 Gastric ulcer, unspecified as acute or chronic, without hemorrhage or perforation: Secondary | ICD-10-CM | POA: Diagnosis not present

## 2016-05-22 DIAGNOSIS — R1310 Dysphagia, unspecified: Secondary | ICD-10-CM | POA: Diagnosis not present

## 2016-05-22 DIAGNOSIS — K2970 Gastritis, unspecified, without bleeding: Secondary | ICD-10-CM | POA: Diagnosis not present

## 2016-05-22 DIAGNOSIS — K219 Gastro-esophageal reflux disease without esophagitis: Secondary | ICD-10-CM | POA: Diagnosis not present

## 2016-05-22 DIAGNOSIS — K210 Gastro-esophageal reflux disease with esophagitis: Secondary | ICD-10-CM | POA: Diagnosis not present

## 2016-05-22 DIAGNOSIS — K228 Other specified diseases of esophagus: Secondary | ICD-10-CM | POA: Diagnosis not present

## 2016-05-28 DIAGNOSIS — K5904 Chronic idiopathic constipation: Secondary | ICD-10-CM | POA: Diagnosis not present

## 2016-05-28 DIAGNOSIS — R140 Abdominal distension (gaseous): Secondary | ICD-10-CM | POA: Diagnosis not present

## 2016-05-28 DIAGNOSIS — Z86010 Personal history of colonic polyps: Secondary | ICD-10-CM | POA: Diagnosis not present

## 2016-05-28 DIAGNOSIS — K219 Gastro-esophageal reflux disease without esophagitis: Secondary | ICD-10-CM | POA: Diagnosis not present

## 2016-06-08 DIAGNOSIS — G43719 Chronic migraine without aura, intractable, without status migrainosus: Secondary | ICD-10-CM | POA: Diagnosis not present

## 2016-06-08 DIAGNOSIS — G44221 Chronic tension-type headache, intractable: Secondary | ICD-10-CM | POA: Diagnosis not present

## 2016-06-08 DIAGNOSIS — G4440 Drug-induced headache, not elsewhere classified, not intractable: Secondary | ICD-10-CM | POA: Diagnosis not present

## 2016-06-19 DIAGNOSIS — F4310 Post-traumatic stress disorder, unspecified: Secondary | ICD-10-CM | POA: Diagnosis not present

## 2016-07-17 DIAGNOSIS — F4310 Post-traumatic stress disorder, unspecified: Secondary | ICD-10-CM | POA: Diagnosis not present

## 2016-08-14 DIAGNOSIS — F4310 Post-traumatic stress disorder, unspecified: Secondary | ICD-10-CM | POA: Diagnosis not present

## 2016-09-09 DIAGNOSIS — R748 Abnormal levels of other serum enzymes: Secondary | ICD-10-CM | POA: Diagnosis not present

## 2016-09-09 DIAGNOSIS — R1084 Generalized abdominal pain: Secondary | ICD-10-CM | POA: Diagnosis not present

## 2016-09-09 DIAGNOSIS — R932 Abnormal findings on diagnostic imaging of liver and biliary tract: Secondary | ICD-10-CM | POA: Diagnosis not present

## 2016-09-11 DIAGNOSIS — F4310 Post-traumatic stress disorder, unspecified: Secondary | ICD-10-CM | POA: Diagnosis not present

## 2016-10-16 DIAGNOSIS — F4310 Post-traumatic stress disorder, unspecified: Secondary | ICD-10-CM | POA: Diagnosis not present

## 2016-10-19 DIAGNOSIS — F4310 Post-traumatic stress disorder, unspecified: Secondary | ICD-10-CM | POA: Diagnosis not present

## 2016-12-04 DIAGNOSIS — F4310 Post-traumatic stress disorder, unspecified: Secondary | ICD-10-CM | POA: Diagnosis not present

## 2016-12-14 DIAGNOSIS — F4310 Post-traumatic stress disorder, unspecified: Secondary | ICD-10-CM | POA: Diagnosis not present

## 2016-12-21 DIAGNOSIS — F4310 Post-traumatic stress disorder, unspecified: Secondary | ICD-10-CM | POA: Diagnosis not present

## 2017-01-15 DIAGNOSIS — F4310 Post-traumatic stress disorder, unspecified: Secondary | ICD-10-CM | POA: Diagnosis not present

## 2017-01-20 DIAGNOSIS — M542 Cervicalgia: Secondary | ICD-10-CM | POA: Diagnosis not present

## 2017-01-27 DIAGNOSIS — F4310 Post-traumatic stress disorder, unspecified: Secondary | ICD-10-CM | POA: Diagnosis not present

## 2017-01-27 DIAGNOSIS — M542 Cervicalgia: Secondary | ICD-10-CM | POA: Diagnosis not present

## 2017-02-02 DIAGNOSIS — M542 Cervicalgia: Secondary | ICD-10-CM | POA: Diagnosis not present

## 2017-02-11 DIAGNOSIS — F4310 Post-traumatic stress disorder, unspecified: Secondary | ICD-10-CM | POA: Diagnosis not present

## 2017-02-26 DIAGNOSIS — F4310 Post-traumatic stress disorder, unspecified: Secondary | ICD-10-CM | POA: Diagnosis not present

## 2017-03-09 DIAGNOSIS — F4310 Post-traumatic stress disorder, unspecified: Secondary | ICD-10-CM | POA: Diagnosis not present

## 2017-03-23 DIAGNOSIS — F4310 Post-traumatic stress disorder, unspecified: Secondary | ICD-10-CM | POA: Diagnosis not present

## 2017-04-06 DIAGNOSIS — F4310 Post-traumatic stress disorder, unspecified: Secondary | ICD-10-CM | POA: Diagnosis not present

## 2017-04-12 DIAGNOSIS — F4310 Post-traumatic stress disorder, unspecified: Secondary | ICD-10-CM | POA: Diagnosis not present

## 2017-05-21 DIAGNOSIS — F4310 Post-traumatic stress disorder, unspecified: Secondary | ICD-10-CM | POA: Diagnosis not present

## 2017-05-25 DIAGNOSIS — F4310 Post-traumatic stress disorder, unspecified: Secondary | ICD-10-CM | POA: Diagnosis not present

## 2017-07-28 DIAGNOSIS — F4310 Post-traumatic stress disorder, unspecified: Secondary | ICD-10-CM | POA: Diagnosis not present

## 2017-08-11 DIAGNOSIS — F4310 Post-traumatic stress disorder, unspecified: Secondary | ICD-10-CM | POA: Diagnosis not present

## 2017-08-13 DIAGNOSIS — F4310 Post-traumatic stress disorder, unspecified: Secondary | ICD-10-CM | POA: Diagnosis not present

## 2017-08-24 DIAGNOSIS — F4310 Post-traumatic stress disorder, unspecified: Secondary | ICD-10-CM | POA: Diagnosis not present

## 2017-08-27 DIAGNOSIS — F4310 Post-traumatic stress disorder, unspecified: Secondary | ICD-10-CM | POA: Diagnosis not present

## 2017-10-01 DIAGNOSIS — F4310 Post-traumatic stress disorder, unspecified: Secondary | ICD-10-CM | POA: Diagnosis not present

## 2017-10-15 ENCOUNTER — Encounter: Payer: Self-pay | Admitting: *Deleted

## 2017-10-19 ENCOUNTER — Encounter: Payer: Self-pay | Admitting: Psychiatry

## 2017-10-19 ENCOUNTER — Ambulatory Visit: Payer: BLUE CROSS/BLUE SHIELD | Admitting: Psychiatry

## 2017-10-19 DIAGNOSIS — F4310 Post-traumatic stress disorder, unspecified: Secondary | ICD-10-CM
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
## 2017-11-01 ENCOUNTER — Ambulatory Visit: Payer: BLUE CROSS/BLUE SHIELD | Admitting: Psychiatry

## 2017-11-01 ENCOUNTER — Encounter: Payer: Self-pay | Admitting: Psychiatry

## 2017-11-01 VITALS — BP 121/73 | HR 72 | Ht 65.0 in | Wt 123.0 lb

## 2017-11-01 DIAGNOSIS — F902 Attention-deficit hyperactivity disorder, combined type: Secondary | ICD-10-CM

## 2017-11-01 DIAGNOSIS — F329 Major depressive disorder, single episode, unspecified: Secondary | ICD-10-CM

## 2017-11-01 DIAGNOSIS — F32A Depression, unspecified: Secondary | ICD-10-CM

## 2017-11-01 DIAGNOSIS — F4310 Post-traumatic stress disorder, unspecified: Secondary | ICD-10-CM

## 2017-11-01 DIAGNOSIS — F5105 Insomnia due to other mental disorder: Secondary | ICD-10-CM | POA: Diagnosis not present

## 2017-11-01 DIAGNOSIS — F99 Mental disorder, not otherwise specified: Secondary | ICD-10-CM

## 2017-11-01 MED ORDER — CLONAZEPAM 1 MG PO TABS: 1.0000 mg | ORAL_TABLET | Freq: Two times a day (BID) | ORAL | 2 refills | Status: DC

## 2017-11-01 MED ORDER — METHYLPHENIDATE HCL 10 MG PO TABS: ORAL_TABLET | ORAL | 0 refills | Status: DC

## 2017-11-01 MED ORDER — ZOLPIDEM TARTRATE 10 MG PO TABS: 10.0000 mg | ORAL_TABLET | Freq: Every evening | ORAL | 5 refills | Status: DC | PRN

## 2017-11-01 MED ORDER — MIRTAZAPINE 15 MG PO TABS
ORAL_TABLET | ORAL | 2 refills | Status: DC
Start: 2017-11-01 — End: 2018-02-01

## 2017-11-01 MED ORDER — OXCARBAZEPINE 300 MG PO TABS: 300.0000 mg | ORAL_TABLET | Freq: Two times a day (BID) | ORAL | 5 refills | Status: DC

## 2017-11-01 NOTE — Progress Notes (Signed)
Laura Simmons [id number] 23-Feb-1975 42 y.o.  Subjective:   Patient ID:  Laura Simmons is a 42 y.o. (DOB 1975-09-24) female.  Chief Complaint: No chief complaint on file.   HPI Laura Simmons presents to the office today for follow-up of anxiety and depression. "I'm kind of just there, with moments of crying." She reports that after starting Remeron she has a week of side effects. She reports feeling as if her feelings are "dampened down." Reports that she has not felt positive emotions. "I'm still really depressed." Continues to have severe grief after loss of her dog and multiple other losses. She reports that her appetite fluctuates. She reports that she has been having anxiety with heart racing and feeling as if her skin is "crawling." She reports that she usually takes klonopin around 7 am and again around 7-8 pm. Denies any recent panic attacks. She reports that her sleep "depends." Energy low today despite adequate sleep last night. Reports other days she has adequate energy despite decreased sleep. Denies excessive energy. Reports sleepless nights on rare occasions. Will notice negative thoughts, such as" where is my life going?Marland Kitchen..how did my life end up this way?" Motivation is also low. She reports "sometimes" having difficulty with concentration. Denies SI.   Reports that her father has been having health issues and has been referred to a hematologist/oncologist. Best friend is moving in a week. She reports few activities and interests and describes staying at home most of the time.   Medications: I have reviewed the patient's current medications.  Medication Side Effects: None  Current Medications: No current outpatient medications on file.   No current facility-administered medications for this visit.    Allergies:  Allergies  Allergen Reactions  . Sumatriptan Succinate Anaphylaxis and Other (See Comments)    ALL TRIPTAN MEDS  . Triptans Anaphylaxis and Other (See  Comments)    Pt states throat feels as if it is closing.  . Compazine [Prochlorperazine] Other (See Comments)    lockjaw  . Ketamine Other (See Comments)    "Pt. Reports she never wants again." major hallucinations  . Reglan [Metoclopramide] Other (See Comments)    "crawling out of my skin"    Past Medical History:  Diagnosis Date  . ADHD (attention deficit hyperactivity disorder)   . Agoraphobia   . Bipolar 1 disorder ([place])    patient denies  . Chronic pain    just migraines per patient  . Endometriosis determined by laparoscopy   . GERD (gastroesophageal reflux disease)   . High platelet count   . Migraine   . Personality disorder ([employer])   . Right adrenal mass ([place])    per ct 09-06-2015  . Right ovarian cyst   . Vaginal Pap smear, abnormal     Family History  Problem Relation Age of Onset  . Migraines Mother   . Anxiety disorder Mother   . Diabetes Maternal Grandmother   . Stroke Maternal Grandfather   . Heart attack Paternal Grandfather   . Anxiety disorder Paternal Aunt   . Post-traumatic stress disorder Cousin        After [place]    Social History   Socioeconomic History  . Marital status: Single    Spouse name: Not on file  . Number of children: Not on file  . Years of education: Not on file  . Highest education level: Not on file  Occupational History  . Not on file  Social Needs  . Financial resource strain: Not on file  .  Food insecurity:    Worry: Not on file    Inability: Not on file  . Transportation needs:    Medical: Not on file    Non-medical: Not on file  Tobacco Use  . Smoking status: Current Every Day Smoker    Packs/day: 0.50    Years: 20.00    Pack years: 10.00    Types: Cigarettes  . Smokeless tobacco: Never Used  Substance and Sexual Activity  . Alcohol use: Yes    Comment: very rare  . Drug use: No  . Sexual activity: Not Currently    Birth control/protection: None  Lifestyle  . Physical activity:    Days per week:  Not on file    Minutes per session: Not on file  . Stress: Not on file  Relationships  . Social connections:    Talks on phone: Not on file    Gets together: Not on file    Attends religious service: Not on file    Active member of club or organization: Not on file    Attends meetings of clubs or organizations: Not on file    Relationship status: Not on file  . Intimate partner violence:    Fear of current or ex partner: Not on file    Emotionally abused: Not on file    Physically abused: Not on file    Forced sexual activity: Not on file  Other Topics Concern  . Not on file  Social History Narrative   Lives with parents in a one story home.  No children.     Currently not working.  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    Education: college    Past Medical History, Surgical history, Social history, and Family history were reviewed and updated as appropriate.   Please see review of systems for further details on the patient's review from today.   Review of Systems:  Review of Systems  Constitutional: Positive for appetite change.  Gastrointestinal: Positive for abdominal distention, abdominal pain and nausea.  Musculoskeletal: Negative for gait problem.  Neurological: Positive for headaches. Negative for tremors.    Objective:   Physical Exam:  BP 121/73   Pulse 72   Ht 5\' 5"  (1.651 m)   Wt 123 lb (55.8 kg)   BMI 20.47 kg/m   Physical Exam  Constitutional: She is oriented to person, place, and time. She appears well-developed.  Musculoskeletal: She exhibits no deformity.  Neurological: She is alert and oriented to person, place, and time.  Psychiatric: Her speech is normal and behavior is normal. Judgment normal. Her mood appears anxious. Cognition and memory are normal. She exhibits a depressed mood. She expresses homicidal ideation. She expresses no suicidal ideation. She expresses no suicidal plans and no homicidal plans.  Presents as tense.  Some restlessness noted.    Lab Review:     Component Value Date/Time   NA 141 11/26/2015 0440   K 3.6 11/26/2015 0440   CL 110 11/26/2015 0440   CO2 23 11/26/2015 0440   GLUCOSE 81 11/26/2015 0440   BUN 6 11/26/2015 0440   CREATININE 0.92 11/26/2015 0440   CALCIUM 8.8 (L) 11/26/2015 0440   PROT 6.0 (L) 11/26/2015 0440   ALBUMIN 3.4 (L) 11/26/2015 0440   AST 13 (L) 11/26/2015 0440   ALT 10 (L) 11/26/2015 0440   ALKPHOS 40 11/26/2015 0440   BILITOT 0.4 11/26/2015 0440   GFRNONAA >60 11/26/2015 0440   GFRAA >60 11/26/2015 0440  Component Value Date/Time   WBC 9.1 11/26/2015 0440   RBC 3.88 11/26/2015 0440   HGB 12.3 11/26/2015 0440   HCT 35.9 (L) 11/26/2015 0440   PLT 308 11/26/2015 0440   MCV 92.5 11/26/2015 0440   MCH 31.7 11/26/2015 0440   MCHC 34.3 11/26/2015 0440   RDW 13.1 11/26/2015 0440   LYMPHSABS 2.9 11/24/2015 1830   MONOABS 1.0 11/24/2015 1830   EOSABS 0.8 (H) 11/24/2015 1830   BASOSABS 0.0 11/24/2015 1830    No results found for: POCLITH, LITHIUM   No results found for: PHENYTOIN, PHENOBARB, VALPROATE, CBMZ   .res Assessment: Plan:   Vision seen for 30 minutes and greater than 50% of session spent counseling patient regarding affect of dulling being a potential side effect of various antidepressants.  Discussed that affect the billing is typically worse at higher doses and reduction in Remeron may help improve possible affect of dulling. Will decrease Remeron 15 mg 1/2 tab po QHS to possibly improve affective dulling.  Patient advised to resume 15 mg at bedtime if she notices any worsening depression, insomnia or decreased appetite.  Discussed attempting to take the lowest possible effective dose of Klonopin since plan is not to continue Klonopin at this dose longer-term.  Advised patient not to take Klonopin too late in the evening due to potential interaction with Ambien.  Recommend continuing psychotherapy with Lina Sayre, [place].  Patient to follow-up  with this provider in 3 months or sooner if clinically indicated. PTSD (post-traumatic stress disorder) - Plan: mirtazapine (REMERON) 15 MG tablet, Oxcarbazepine (TRILEPTAL) 300 MG tablet, clonazePAM (KLONOPIN) 1 MG tablet  Depression, unspecified depression type - Plan: mirtazapine (REMERON) 15 MG tablet  Insomnia due to other mental disorder - Plan: Oxcarbazepine (TRILEPTAL) 300 MG tablet, zolpidem (AMBIEN) 10 MG tablet  Attention deficit hyperactivity disorder (ADHD), combined type - Plan: methylphenidate (RITALIN) 10 MG tablet, methylphenidate (RITALIN) 10 MG tablet, methylphenidate (RITALIN) 10 MG tablet  Please see After Visit Summary for patient specific instructions.  Future Appointments  Date Time Provider [place]  12/09/2017 10:00 AM Lina Sayre, [facility] None  02/01/2018  2:00 PM Thayer Headings, PM[facility]NP CP-CP None    No orders of the defined types were placed in this encounter.     -------------------------------

## 2017-11-10 ENCOUNTER — Ambulatory Visit: Payer: BLUE CROSS/BLUE SHIELD | Admitting: Psychiatry

## 2017-11-10 ENCOUNTER — Encounter: Payer: Self-pay | Admitting: Psychiatry

## 2017-11-10 DIAGNOSIS — F4310 Post-traumatic stress disorder, unspecified: Secondary | ICD-10-CM

## 2017-11-10 NOTE — Progress Notes (Signed)
      [facility] Counselor/Therapist Progress Note   Patient ID: Laura Simmons, MRN: [id number]  Date: 11/10/2017  Timespent: 47 minutes  Treatment Type: Individual  Subjective: Patient was present for session.  She reported she is continuing to go through lots of difficult situations.  Patient explained that her father is currently undergoing tests to see if he has bone cancer.  She reported that it is difficult for her to see him pain that he currently is in.  She went on to explain that her mother's dog jumped off of steps and was paralyzed but is starting to be able to have use of her legs again which is very positive.  Patient shared that she has difficulty communicating her emotions and sadness with her friends because she does not want to feel like she is just whining all the time.  Discussed the importance of releasing all the grief that she is had and to recognize that it is not normal to have lost as much as she has over the past 2 years.  Patient was encouraged to get back to journaling.  Patient was also encouraged to realize that if she is not going to communicate her sadness and emotion she has to release it from her body in a physical manner which means getting back to some form of exercising.  Patient acknowledged that she feels better when she does some movement and it helps her sleep some at night but it is hard to get motivated.  Ways to talk herself through that were discussed in session.  Patient was also encouraged to recognize she has to be present for her mother and to do so she has to take better care of herself.  Patient acknowledged that it was a need and agreed to try and start reminding herself of that importance rather than allowing her thoughts as to cycle.  Interventions:Solution Focused and Supportive  Mental Status Exam:   Appearance:   Well Groomed     Behavior:  Appropriate  Motor:  Normal  Speech/Language:   Normal Rate  Affect:  Appropriate  Mood:   anxious  Thought process:  Coherent  Thought content:    Logical  Perceptual disturbances:    Normal  Orientation:  Full (Time, Place, and Person)  Attention:  Good  Concentration:  good  Memory:  Immediate  Fund of knowledge:   Good  Insight:    Good  Judgment:   Good  Impulse Control:  good    Reported Symptoms: anxiety, flashbacks, crying spells, unresolved grief issues, low motivation  Risk Assessment: Danger to Self:  No Self-injurious Behavior: No Danger to Others: No Duty to Warn:no Physical Aggression / Violence:No  Access to Firearms a concern: No  Gang Involvement:No   Diagnosis:   ICD-10-CM   1. PTSD (post-traumatic stress disorder) F43.10      Plan: 1.  Patient to continue to engage in individual counseling 2-4 times a month or as needed. 2.  Patient to identify and apply coping skills learned in session to decrease grief and anxiety symptoms. 3.  Patient to contact this office, go to the local ED or call 911 if a crisis or emergency develops between visits.  Lina Sayre, [place]

## 2017-11-12 IMAGING — CR DG CHEST 2V
2 series · 2 of 2 positions shown · non-contrast
Comparison: 01/19/2013

CLINICAL DATA: Chest pain, shortness breath common nausea for 2
days.

EXAM:
CHEST  2 VIEW

[w chest pa]
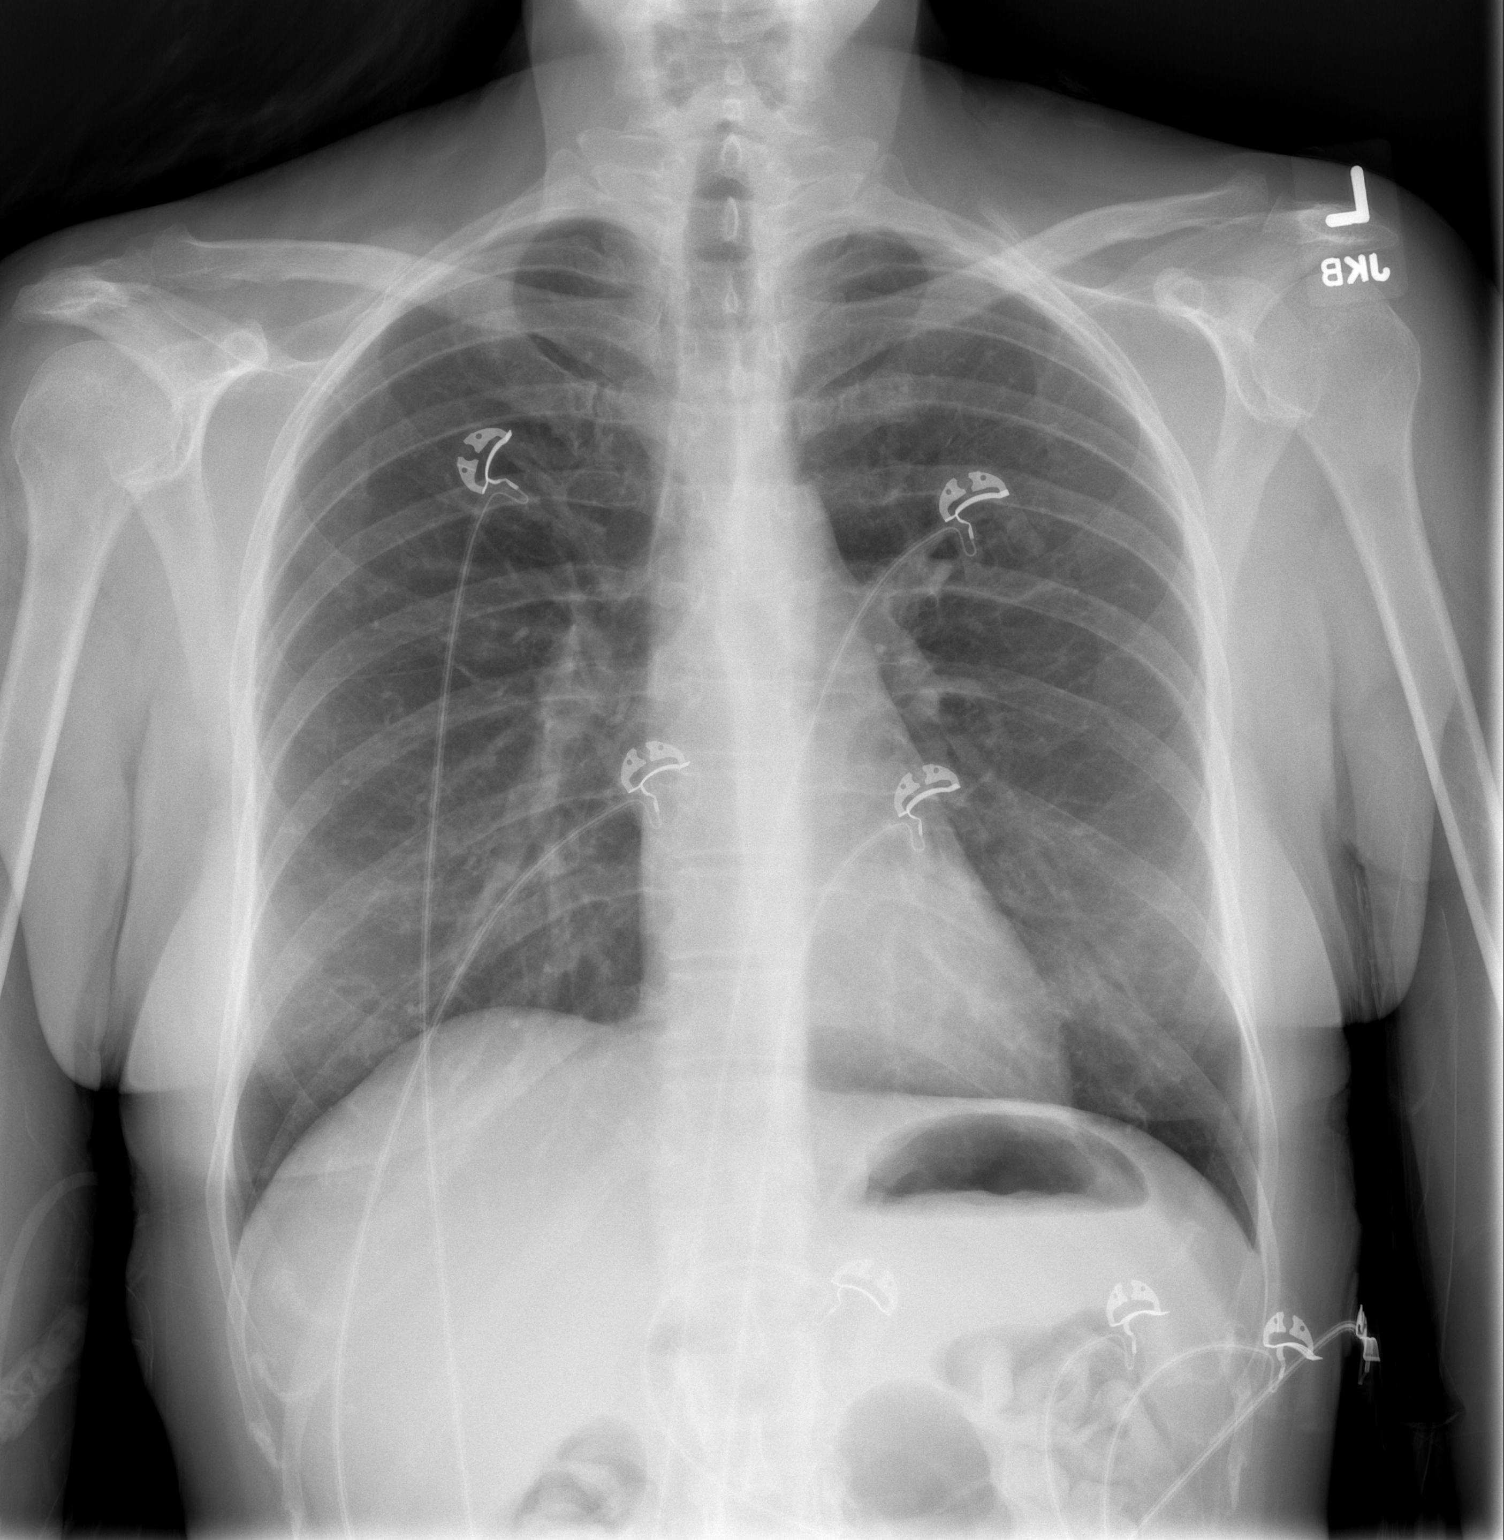

[w chest lat]
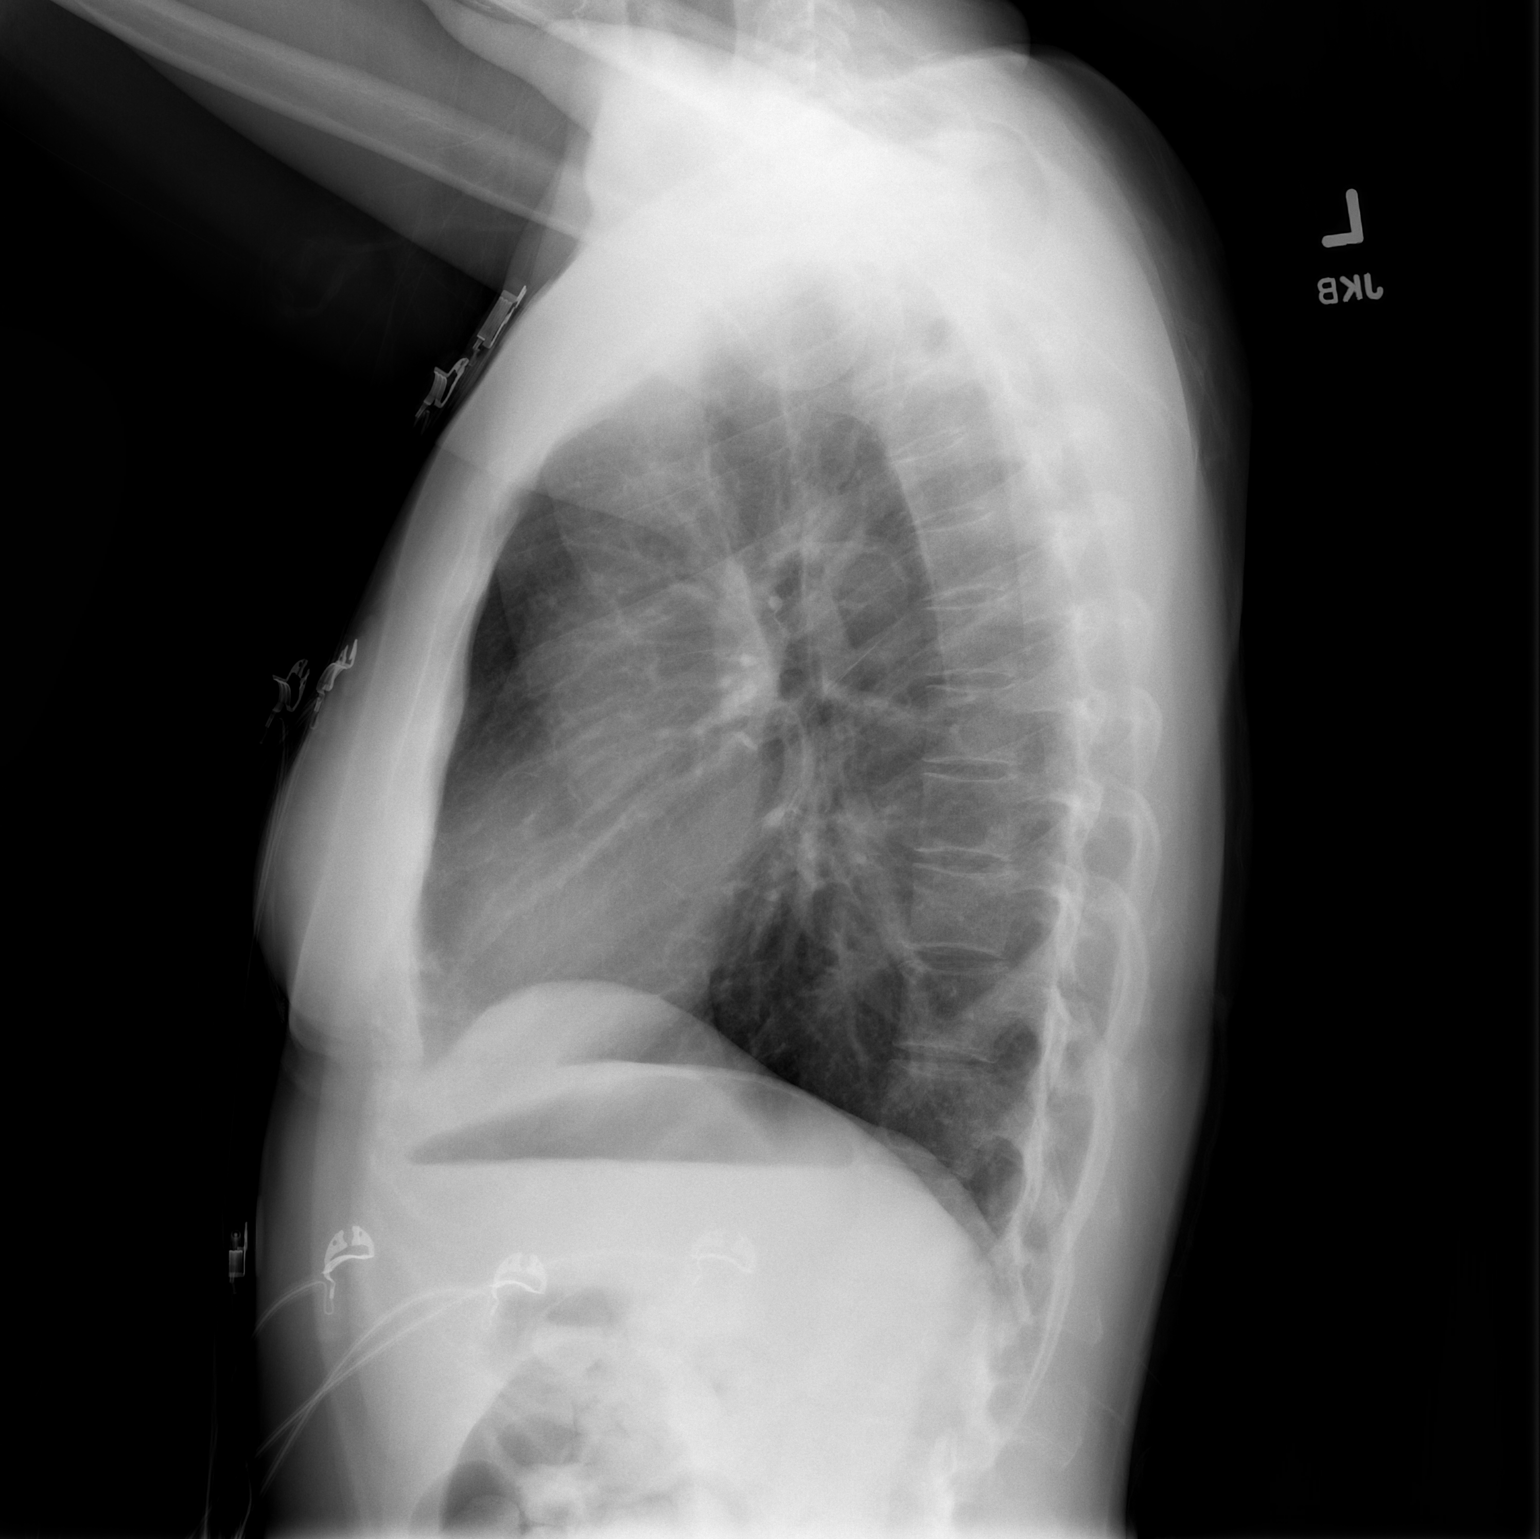

[2 of 2 positions shown; findings below may reference images not displayed]

FINDINGS: The heart size and mediastinal contours are within normal limits.
Both lungs are clear. The visualized skeletal structures are
unremarkable.
IMPRESSION: No active cardiopulmonary disease.

## 2017-12-07 ENCOUNTER — Ambulatory Visit: Payer: BLUE CROSS/BLUE SHIELD | Admitting: Psychiatry

## 2017-12-07 DIAGNOSIS — F4310 Post-traumatic stress disorder, unspecified: Secondary | ICD-10-CM
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
## 2017-12-09 ENCOUNTER — Ambulatory Visit: Payer: BLUE CROSS/BLUE SHIELD | Admitting: Psychiatry

## 2017-12-29 ENCOUNTER — Encounter: Payer: Self-pay | Admitting: Psychiatry

## 2017-12-29 ENCOUNTER — Ambulatory Visit: Payer: BLUE CROSS/BLUE SHIELD | Admitting: Psychiatry

## 2017-12-29 DIAGNOSIS — F4310 Post-traumatic stress disorder, unspecified: Secondary | ICD-10-CM | POA: Diagnosis not present

## 2017-12-29 NOTE — Progress Notes (Signed)
      [facility] Counselor/Therapist Progress Note  Patient ID: Keith Cancio, MRN: [id number],    Date: 12/29/2017  Time Spent: 51 minutes  Treatment Type: Individual Therapy  Reported Symptoms: Depressed mood, Anxious Mood, Sleep disturbance, Fatigue and Somatic complaints  Mental Status Exam:  Appearance:   Well Groomed     Behavior:  Appropriate  Motor:  Normal  Speech/Language:   Normal Rate  Affect:  Congruent  Mood:  sad  Thought process:  circumstantial  Thought content:    WNL  Sensory/Perceptual disturbances:    WNL  Orientation:  oriented to person, place and time/date  Attention:  Good  Concentration:  Good  Memory:  WNL  Fund of knowledge:   Good  Insight:    Good  Judgment:   Good  Impulse Control:  Good   Risk Assessment: Danger to Self:  No Self-injurious Behavior: No Danger to Others: No Duty to Warn:no Physical Aggression / Violence:No  Access to Firearms a concern: No  Gang Involvement:No   Subjective: Patient was present for session.  Patient reported she was continuing to have lots of grief over the loss of her dog.  Patient explained that it is hard understand why this grief has been so difficult for her.  Patient was encouraged to recognize that losing scarlet came in after losing disco and drew.  Patient was reminded that Scarlet had been the one in her life the whole time so is more extreme.  Patient was encouraged to process through a new dog.  Patient reported the disturbing picture was seeing 2 puppies.  Suds level was 8, negative believe "I cannot take care of them" patient felt overwhelmed in her stomach in her chest.  Patient was able to reduce her suds level to 4.  Discussed the fact that it is okay for her not to get another puppy until she is ready to move on.  Patient was encouraged to take care of her endometriosis which she reported was creating lots of pain for her.  Patient was also encouraged to use self talk to remind herself that  its okay to wait even when other people do not understand her choice.  Interventions: Solution-Oriented/Positive Psychology and Eye Movement Desensitization and Reprocessing (EMDR)  Diagnosis:   ICD-10-CM   1. PTSD (post-traumatic stress disorder) F43.10     Plan: 1.  Patient to continue to engage in individual counseling 2-4 times a month or as needed. 2.  Patient to identify and apply CBT, coping skills learned in session to decrease depression and anxiety symptoms. 3.  Patient to contact this office, go to the local ED or call 911 if a crisis or emergency develops between visits.  Lina Sayre, [place]

## 2018-01-13 ENCOUNTER — Encounter: Payer: Self-pay | Admitting: Emergency Medicine

## 2018-01-13 DIAGNOSIS — F39 Unspecified mood [affective] disorder: Secondary | ICD-10-CM | POA: Insufficient documentation

## 2018-02-01 ENCOUNTER — Encounter: Payer: Self-pay | Admitting: Psychiatry

## 2018-02-01 ENCOUNTER — Ambulatory Visit: Payer: BLUE CROSS/BLUE SHIELD | Admitting: Psychiatry

## 2018-02-01 VITALS — BP 131/74 | HR 75

## 2018-02-01 DIAGNOSIS — F5105 Insomnia due to other mental disorder: Secondary | ICD-10-CM

## 2018-02-01 DIAGNOSIS — F4310 Post-traumatic stress disorder, unspecified: Secondary | ICD-10-CM | POA: Diagnosis not present

## 2018-02-01 DIAGNOSIS — F902 Attention-deficit hyperactivity disorder, combined type: Secondary | ICD-10-CM | POA: Diagnosis not present

## 2018-02-01 DIAGNOSIS — F329 Major depressive disorder, single episode, unspecified: Secondary | ICD-10-CM

## 2018-02-01 DIAGNOSIS — F99 Mental disorder, not otherwise specified: Secondary | ICD-10-CM

## 2018-02-01 DIAGNOSIS — F32A Depression, unspecified: Secondary | ICD-10-CM

## 2018-02-01 MED ORDER — CLONAZEPAM 1 MG PO TABS: 1.0000 mg | ORAL_TABLET | Freq: Two times a day (BID) | ORAL | 2 refills | Status: DC

## 2018-02-01 MED ORDER — METHYLPHENIDATE HCL 10 MG PO TABS: ORAL_TABLET | ORAL | 0 refills | Status: DC

## 2018-02-01 MED ORDER — OLANZAPINE 5 MG PO TABS: ORAL_TABLET | ORAL | 2 refills | Status: DC

## 2018-02-01 NOTE — Progress Notes (Signed)
Laura Simmons [id number] 11-23-1975 43 y.o.  Subjective:   Patient ID:  Laura Simmons is a 43 y.o. (DOB 04-28-1975) female.  Chief Complaint:  Chief Complaint  Patient presents with  . Anxiety  . Insomnia  . Depression    HPI Laura Simmons presents to the office today for follow-up of anxiety and depression. She reports that she had severe GI s/s throughout the night last night. She reports that she also had the flu over Christmas and was sick for 2 weeks. "I still am grieving over my dog." She reports that she recognizes that getting another dog would likely be helpful but is afraid of something happening to another pet. Has anxiety about cleaning out storage unit of 3 years "that has everything from my previous life." She reports that she has had some anxiety recently and has had an exaggerated startle response. She reports that she will periodically awaken with heart racing and increased BP. She reports that her sleep is "good one week and horrible the next." She reports that her appetite has been ok. Reports that she is having interest in some things and notices that she is not acting on these thoughts- "I don't know what's stopping me." Reports motivation is low. Reports that her concentration has been "ok." Denies SI.   Not currently working. Father's health is improving.   Best friend is pregnant and expecting a baby girl and pt is planning a baby shower.    Review of Systems:  Review of Systems  Gastrointestinal: Positive for diarrhea.  Musculoskeletal: Negative for gait problem.  Neurological: Positive for headaches. Negative for tremors.       Reports HA's are not as bad as they were previously.   Psychiatric/Behavioral:       Please refer to HPI    Medications: I have reviewed the patient's current medications.  Current Outpatient Medications  Medication Sig Dispense Refill  . Aspirin-Salicylamide-Caffeine (BC HEADACHE POWDER PO) Take by mouth as needed.    .  calcium carbonate (TUMS) 500 MG chewable tablet Chew 1 tablet by mouth as needed for indigestion or heartburn.    . clonazePAM (KLONOPIN) 1 MG tablet Take 1 tablet (1 mg total) by mouth 2 (two) times daily. Take 1/2-1 tab po BID prn anxiety 60 tablet 2  . dimenhyDRINATE (DRAMAMINE) 50 MG tablet Take 50 mg by mouth at bedtime as needed for nausea.    Marland Kitchen docusate sodium (COLACE) 100 MG capsule Take 1 capsule (100 mg total) by mouth 2 (two) times daily. 30 capsule 2  . methylphenidate (RITALIN) 10 MG tablet Take 1 tab two to three times daily. 90 tablet 0  . [START ON 03/29/2018] methylphenidate (RITALIN) 10 MG tablet Take 1 tablet two to three times daily 90 tablet 0  . [START ON 03/01/2018] methylphenidate (RITALIN) 10 MG tablet Take 1 tablet two to three times daily 90 tablet 0  . ondansetron (ZOFRAN) 4 MG tablet Take 4 mg by mouth 2 (two) times daily as needed for nausea or vomiting.    . Oxcarbazepine (TRILEPTAL) 300 MG tablet Take 1 tablet (300 mg total) by mouth 2 (two) times daily. 30 tablet 5  . simethicone (MYLICON) 315 MG chewable tablet Chew 125 mg by mouth every 6 (six) hours as needed for flatulence.    Marland Kitchen zolpidem (AMBIEN) 10 MG tablet Take 1 tablet (10 mg total) by mouth at bedtime as needed for sleep. 30 tablet 5  . clonazePAM (KLONOPIN) 0.5 MG tablet Take 1 tablet (0.5 mg  total) by mouth 2 (two) times daily as needed for anxiety. (Patient taking differently: Take 1 mg by mouth 2 (two) times daily as needed for anxiety. 1/2-1 po BID) 30 tablet 1  . OLANZapine (ZYPREXA) 5 MG tablet Take 1/2-1 tab po QHS 30 tablet 2  . pravastatin (PRAVACHOL) 40 MG tablet Take 1 tablet (40 mg total) by mouth daily. (Patient not taking: Reported on 11/01/2017) 90 tablet 1   No current facility-administered medications for this visit.     Medication Side Effects: None  Allergies:  Allergies  Allergen Reactions  . Sumatriptan Succinate Anaphylaxis and Other (See Comments)    ALL TRIPTAN MEDS  . Triptans  Anaphylaxis and Other (See Comments)    Pt states throat feels as if it is closing.  . Compazine [Prochlorperazine] Other (See Comments)    lockjaw  . Ketamine Other (See Comments)    "Pt. Reports she never wants again." major hallucinations  . Reglan [Metoclopramide] Other (See Comments)    "crawling out of my skin"    Past Medical History:  Diagnosis Date  . ADHD (attention deficit hyperactivity disorder)   . Agoraphobia   . Bipolar 1 disorder ([place])    patient denies  . Chronic pain    just migraines per patient  . Endometriosis determined by laparoscopy   . GERD (gastroesophageal reflux disease)   . High platelet count   . Migraine   . Personality disorder ([place])   . Right adrenal mass ([place])    per ct 09-06-2015  . Right ovarian cyst   . Vaginal Pap smear, abnormal     Family History  Problem Relation Age of Onset  . Migraines Mother   . Anxiety disorder Mother   . Diabetes Maternal Grandmother   . Stroke Maternal Grandfather   . Heart attack Paternal Grandfather   . Anxiety disorder Paternal Aunt   . Post-traumatic stress disorder Cousin        After [place]    Social History   Socioeconomic History  . Marital status: Single    Spouse name: Not on file  . Number of children: Not on file  . Years of education: Not on file  . Highest education level: Not on file  Occupational History  . Not on file  Social Needs  . Financial resource strain: Not on file  . Food insecurity:    Worry: Not on file    Inability: Not on file  . Transportation needs:    Medical: Not on file    Non-medical: Not on file  Tobacco Use  . Smoking status: Current Every Day Smoker    Packs/day: 0.50    Years: 20.00    Pack years: 10.00    Types: Cigarettes  . Smokeless tobacco: Never Used  Substance and Sexual Activity  . Alcohol use: Yes    Comment: very rare  . Drug use: No  . Sexual activity: Not Currently    Birth control/protection: None  Lifestyle  . Physical  activity:    Days per week: Not on file    Minutes per session: Not on file  . Stress: Not on file  Relationships  . Social connections:    Talks on phone: Not on file    Gets together: Not on file    Attends religious service: Not on file    Active member of club or organization: Not on file    Attends meetings of clubs or organizations: Not on file    Relationship  status: Not on file  . Intimate partner violence:    Fear of current or ex partner: Not on file    Emotionally abused: Not on file    Physically abused: Not on file    Forced sexual activity: Not on file  Other Topics Concern  . Not on file  Social History Narrative   Lives with parents in a one story home.  No children.     Currently not working.  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    Education: college    Past Medical History, Surgical history, Social history, and Family history were reviewed and updated as appropriate.   Please see review of systems for further details on the patient's review from today.   Objective:   Physical Exam:  BP 131/74   Pulse 75   Physical Exam Constitutional:      General: She is not in acute distress.    Appearance: She is well-developed.  Musculoskeletal:        General: No deformity.  Neurological:     Mental Status: She is alert and oriented to person, place, and time.     Coordination: Coordination normal.  Psychiatric:        Mood and Affect: Mood is depressed. Mood is not anxious. Affect is tearful. Affect is not labile, blunt, angry or inappropriate.        Speech: Speech normal.        Behavior: Behavior normal.        Thought Content: Thought content normal. Thought content does not include homicidal or suicidal ideation. Thought content does not include homicidal or suicidal plan.        Judgment: Judgment normal.     Comments: Insight intact. No auditory or visual hallucinations. No delusions.      Lab Review:     Component Value  Date/Time   NA 141 11/26/2015 0440   K 3.6 11/26/2015 0440   CL 110 11/26/2015 0440   CO2 23 11/26/2015 0440   GLUCOSE 81 11/26/2015 0440   BUN 6 11/26/2015 0440   CREATININE 0.92 11/26/2015 0440   CALCIUM 8.8 (L) 11/26/2015 0440   PROT 6.0 (L) 11/26/2015 0440   ALBUMIN 3.4 (L) 11/26/2015 0440   AST 13 (L) 11/26/2015 0440   ALT 10 (L) 11/26/2015 0440   ALKPHOS 40 11/26/2015 0440   BILITOT 0.4 11/26/2015 0440   GFRNONAA >60 11/26/2015 0440   GFRAA >60 11/26/2015 0440       Component Value Date/Time   WBC 9.1 11/26/2015 0440   RBC 3.88 11/26/2015 0440   HGB 12.3 11/26/2015 0440   HCT 35.9 (L) 11/26/2015 0440   PLT 308 11/26/2015 0440   MCV 92.5 11/26/2015 0440   MCH 31.7 11/26/2015 0440   MCHC 34.3 11/26/2015 0440   RDW 13.1 11/26/2015 0440   LYMPHSABS 2.9 11/24/2015 1830   MONOABS 1.0 11/24/2015 1830   EOSABS 0.8 (H) 11/24/2015 1830   BASOSABS 0.0 11/24/2015 1830    No results found for: POCLITH, LITHIUM   No results found for: PHENYTOIN, PHENOBARB, VALPROATE, CBMZ   .res Assessment: Plan:   Will stop Remeron due to minimal improvement. Discussed potential benefits, risks, and side effects of Olanzapine. Will start Olanzapine to improve insomnia, mood, and anxiety since these s/s are not currently well controlled. Continue Klonopin for anxiety. Continue Ritalin for ADD.  PTSD (post-traumatic stress disorder) - Plan: OLANZapine (ZYPREXA) 5 MG tablet, clonazePAM (KLONOPIN) 1 MG tablet  Attention  deficit hyperactivity disorder (ADHD), combined type - Plan: methylphenidate (RITALIN) 10 MG tablet, methylphenidate (RITALIN) 10 MG tablet, methylphenidate (RITALIN) 10 MG tablet  Depression, unspecified depression type  Insomnia due to other mental disorder - Plan: OLANZapine (ZYPREXA) 5 MG tablet  Please see After Visit Summary for patient specific instructions.  Future Appointments  Date Time Provider [place]  02/04/2018 11:00 AM Lina Sayre, [place] CP-CP  None  02/17/2018  1:00 PM Lina Sayre, [place] [facility] None  03/15/2018  1:30 PM Thayer Headings, PMHNP CP-CP None    No orders of the defined types were placed in this encounter.     -------------------------------

## 2018-02-04 ENCOUNTER — Encounter: Payer: Self-pay | Admitting: Psychiatry

## 2018-02-04 ENCOUNTER — Ambulatory Visit: Payer: BLUE CROSS/BLUE SHIELD | Admitting: Psychiatry

## 2018-02-04 DIAGNOSIS — F329 Major depressive disorder, single episode, unspecified: Secondary | ICD-10-CM

## 2018-02-04 DIAGNOSIS — F4310 Post-traumatic stress disorder, unspecified: Secondary | ICD-10-CM

## 2018-02-04 DIAGNOSIS — F32A Depression, unspecified: Secondary | ICD-10-CM

## 2018-02-04 NOTE — Progress Notes (Signed)
      [facility] Counselor/Therapist Progress Note  Patient ID: Laura Simmons, MRN: [id number],    Date: 02/04/2018  Time Spent: 51  minutes  Treatment Type: Individual Therapy  Reported Symptoms: Depressed mood, Anxious Mood, Sleep disturbance and Fatigue  Mental Status Exam:  Appearance:   Casual     Behavior:  Appropriate  Motor:  Normal  Speech/Language:   Normal Rate  Affect:  Congruent  Mood:  anxious  Thought process:  circumstantial  Thought content:    WNL  Sensory/Perceptual disturbances:    WNL  Orientation:  oriented to person, place and time/date  Attention:  Fair  Concentration:  Fair  Memory:  Immediate;   [place] of knowledge:   Fair  Insight:    Fair  Judgment:   Fair  Impulse Control:  Fair   Risk Assessment: Danger to Self:  No Self-injurious Behavior: No Danger to Others: No Duty to Warn:no Physical Aggression / Violence:No  Access to Firearms a concern: No  Gang Involvement:No   Subjective: Patient was present for session.  She reported she was very sick with the flu over the holidays and that made her depression worse.  She explained that she is continuing to feel lots of grief and sadness over the loss of her dog.  Discussed why that may be an issue and different CBT skills to talk herself through that were discussed with patient.  Patient was able to acknowledge she feels very stuck and that bad things continue to happen to her.  She was encouraged to remind herself that those things are not her fault and she does not deserve bad things.  Patient was encouraged to find some ways to add structure to her life so that he feels she can get things accomplished.  Patient was able to develop some plans to accomplish more at the house as well as starting to work on her storage unit in [place].  Getting rid of the stuff in [place] feels more like an ending to that time in her life and she feels that may be why she is avoiding it.  CBT filters to help  talk herself through that were discussed with patient.  Interventions: Cognitive Behavioral Therapy and Solution-Oriented/Positive Psychology  Diagnosis:   ICD-10-CM   1. PTSD (post-traumatic stress disorder) F43.10   2. Depression, unspecified depression type F32.9     Plan: 1.  Patient to continue to engage in individual counseling 2-4 times a month or as needed. 2.  Patient to identify and apply CBT, coping skills learned in session to decrease depression and anxiety symptoms. 3.  Patient to contact this office, go to the local ED or call 911 if a crisis or emergency develops between visits.  Lina Sayre, [place]

## 2018-02-17 ENCOUNTER — Ambulatory Visit: Payer: BLUE CROSS/BLUE SHIELD | Admitting: Psychiatry

## 2018-03-15 ENCOUNTER — Ambulatory Visit: Payer: BLUE CROSS/BLUE SHIELD | Admitting: Psychiatry

## 2018-03-18 ENCOUNTER — Ambulatory Visit: Payer: BLUE CROSS/BLUE SHIELD | Admitting: Psychiatry

## 2018-03-18 ENCOUNTER — Encounter: Payer: Self-pay | Admitting: Psychiatry

## 2018-03-18 DIAGNOSIS — F4310 Post-traumatic stress disorder, unspecified: Secondary | ICD-10-CM | POA: Diagnosis not present

## 2018-03-18 NOTE — Progress Notes (Signed)
      [facility] Counselor/Therapist Progress Note  Patient ID: Laura Simmons, MRN: [id number],    Date: 03/18/2018  Time Spent: 52 minutes   Treatment Type: Individual Therapy  Reported Symptoms: triggered responses, anxiety, isolation, grief issues  Mental Status Exam:  Appearance:   Casual     Behavior:  Appropriate  Motor:  Normal  Speech/Language:   Normal Rate  Affect:  Appropriate  Mood:  normal  Thought process:  normal  Thought content:    WNL  Sensory/Perceptual disturbances:    Flashback  Orientation:  oriented to person, place and time/date  Attention:  Good  Concentration:  Good  Memory:  Immediate;   [place] of knowledge:   Fair  Insight:    Fair  Judgment:   Fair  Impulse Control:  Fair   Risk Assessment: Danger to Self:  No Self-injurious Behavior: No Danger to Others: No Duty to Warn:no Physical Aggression / Violence:No  Access to Firearms a concern: No  Gang Involvement:No   Subjective: Patient was present for session.  She reported her best friend lost her baby at 21 weeks. Patient shared it has been devastating for both of them.  Patient explained she is also realizing her bedroom is very triggering for her.  She shares that all the memories she has had there have to do with grief.  Since she moved there right before Dian Situ hung himself and since then she is also lost both of her dogs.  Discussed the importance of getting out of the house more and especially just getting out of that bedroom since she cannot move anywhere at this point and it is such a trigger for her.  Patient was encouraged to start focusing on telling herself the right stuff and recognizing she can have a future.  Patient was encouraged to try and find some place to volunteer or work part-time just to get her brain engaged and other things besides her traumas.  Different strategies to help her do that were discussed with patient.  Patient is having a friend staying with her currently  and she recognizes he can help her get motivated so she is going to ask him to do so.  Interventions: Cognitive Behavioral Therapy and Solution-Oriented/Positive Psychology  Diagnosis:   ICD-10-CM   1. PTSD (post-traumatic stress disorder) F43.10     Plan: 1.  Patient to continue to engage in individual counseling 2-4 times a month or as needed. 2.  Patient to identify and apply CBT, coping skills learned in session to decrease triggered responses and anxiety symptoms. 3.  Patient to contact this office, go to the local ED or call 911 if a crisis or emergency develops between visits.  Lina Sayre, [place]    This record has been created using [employer].  Chart creation errors have been sought, but may not always have been located and corrected. Such creation errors do not reflect on the standard of medical care.

## 2018-03-22 ENCOUNTER — Ambulatory Visit: Payer: BLUE CROSS/BLUE SHIELD | Admitting: Psychiatry

## 2018-03-22 ENCOUNTER — Encounter: Payer: Self-pay | Admitting: Psychiatry

## 2018-03-22 VITALS — BP 129/83 | HR 87

## 2018-03-22 DIAGNOSIS — F5105 Insomnia due to other mental disorder: Secondary | ICD-10-CM

## 2018-03-22 DIAGNOSIS — F4310 Post-traumatic stress disorder, unspecified: Secondary | ICD-10-CM | POA: Diagnosis not present

## 2018-03-22 DIAGNOSIS — F32A Depression, unspecified: Secondary | ICD-10-CM

## 2018-03-22 DIAGNOSIS — F99 Mental disorder, not otherwise specified: Secondary | ICD-10-CM

## 2018-03-22 DIAGNOSIS — F902 Attention-deficit hyperactivity disorder, combined type: Secondary | ICD-10-CM

## 2018-03-22 DIAGNOSIS — F329 Major depressive disorder, single episode, unspecified: Secondary | ICD-10-CM | POA: Diagnosis not present

## 2018-03-22 MED ORDER — METHYLPHENIDATE HCL 10 MG PO TABS: ORAL_TABLET | ORAL | 0 refills | Status: DC

## 2018-03-22 NOTE — Progress Notes (Signed)
Laura Simmons  [id number] [birth year] 43 y.o.  Subjective:   Patient ID:  Laura Simmons is a 43 y.o. (DOB 01-11-1976) female.  Chief Complaint:  Chief Complaint  Patient presents with  . Anxiety  . Depression  . ADD    HPI Laura Simmons presents to the office today for follow-up of depression, anxiety, and ADD. She reports, "I've been ok." She reports that she has recently went to [facility] and noticed that she did not have chronic stomach issues (bloating, flatulence, abdominal discomfort) and noticed these s/s returned when she came back home and suspects that these s/s may be related to anxiety. She also notices that she will periodically gasp for air when anxious. Denies any recent panic attacks. Reports that her mood has improved since she has recently started a relationship but also notices "over-thinking about getting my feelings hurt." Reports that a friend recently noticed pt was "zoned out, just staring" off. Reports that she has been sleeping 5-6 hours a night. She reports that her appetite is decreased due to stomach issues and has been able to eat when out of town. She reports that she has some difficulty with concentration and distractibility. Denies SI.   She reports that she is having difficulty getting her room organized. Reports that she has had difficulty going through and getting rid of items that reminded her of loved ones that have died. Reports that 04/23/2022 is a difficult month for her because of anniversary dates.   She reports that she "couldn't stop crying" and felt "super depressed" after taking Olanzapine for 2 weeks and that these s/s resolved with stopping Olanzapine. She reports that she also stopped Trileptal around the same time.   She reports that a friend has been visiting and this has been helpful because otherwise "I tend to isolate."   Past Psychiatric Medication Trials: Zyprexa- worsening depression Trileptal- benefits did not outweigh  risks Ritalin Remeron Seroquel- RLS Depakote Trazodone- excessive daytime somnolence Klonopin Ambien  Review of Systems:  Review of Systems  Gastrointestinal: Positive for abdominal distention, abdominal pain and constipation.  Musculoskeletal: Negative for gait problem.  Neurological: Positive for headaches. Negative for tremors.  Psychiatric/Behavioral:       Please refer to HPI    Medications: I have reviewed the patient's current medications.  Current Outpatient Medications  Medication Sig Dispense Refill  . Aspirin-Salicylamide-Caffeine (BC HEADACHE POWDER PO) Take by mouth as needed.    . calcium carbonate (TUMS) 500 MG chewable tablet Chew 1 tablet by mouth as needed for indigestion or heartburn.    . clonazePAM (KLONOPIN) 1 MG tablet Take 1 tablet (1 mg total) by mouth 2 (two) times daily. Take 1/2-1 tab po BID prn anxiety 60 tablet 2  . dimenhyDRINATE (DRAMAMINE) 50 MG tablet Take 50 mg by mouth at bedtime as needed for nausea.    Marland Kitchen docusate sodium (COLACE) 100 MG capsule Take 1 capsule (100 mg total) by mouth 2 (two) times daily. 30 capsule 2  . [START ON 03/29/2018] methylphenidate (RITALIN) 10 MG tablet Take 1 tablet two to three times daily 90 tablet 0  . [START ON 04/26/2018] methylphenidate (RITALIN) 10 MG tablet Take 1 tab two to three times daily. 90 tablet 0  . [START ON 05/24/2018] methylphenidate (RITALIN) 10 MG tablet Take 1 tablet two to three times daily 90 tablet 0  . ondansetron (ZOFRAN) 4 MG tablet Take 4 mg by mouth 2 (two) times daily as needed for nausea or vomiting.    . pravastatin (  PRAVACHOL) 40 MG tablet Take 1 tablet (40 mg total) by mouth daily. 90 tablet 1  . simethicone (MYLICON) 580 MG chewable tablet Chew 125 mg by mouth every 6 (six) hours as needed for flatulence.    Marland Kitchen zolpidem (AMBIEN) 10 MG tablet Take 1 tablet (10 mg total) by mouth at bedtime as needed for sleep. 30 tablet 5  . clonazePAM (KLONOPIN) 0.5 MG tablet Take 1 tablet (0.5 mg total)  by mouth 2 (two) times daily as needed for anxiety. (Patient taking differently: Take 1 mg by mouth 2 (two) times daily as needed for anxiety. 1/2-1 po BID) 30 tablet 1   No current facility-administered medications for this visit.     Medication Side Effects: None  Allergies:  Allergies  Allergen Reactions  . Sumatriptan Succinate Anaphylaxis and Other (See Comments)    ALL TRIPTAN MEDS  . Triptans Anaphylaxis and Other (See Comments)    Pt states throat feels as if it is closing.  . Compazine [Prochlorperazine] Other (See Comments)    lockjaw  . Ketamine Other (See Comments)    "Pt. Reports she never wants again." major hallucinations  . Reglan [Metoclopramide] Other (See Comments)    "crawling out of my skin"    Past Medical History:  Diagnosis Date  . ADHD (attention deficit hyperactivity disorder)   . Agoraphobia   . Bipolar 1 disorder ([place])    patient denies  . Chronic pain    just migraines per patient  . Endometriosis determined by laparoscopy   . GERD (gastroesophageal reflux disease)   . High platelet count   . Migraine   . Personality disorder ([place])   . Right adrenal mass ([place])    per ct 09-06-2015  . Right ovarian cyst   . Vaginal Pap smear, abnormal     Family History  Problem Relation Age of Onset  . Migraines Mother   . Anxiety disorder Mother   . Diabetes Maternal Grandmother   . Stroke Maternal Grandfather   . Heart attack Paternal Grandfather   . Anxiety disorder Paternal Aunt   . Post-traumatic stress disorder Cousin        After [place]    Social History   Socioeconomic History  . Marital status: Single    Spouse name: Not on file  . Number of children: Not on file  . Years of education: Not on file  . Highest education level: Not on file  Occupational History  . Not on file  Social Needs  . Financial resource strain: Not on file  . Food insecurity:    Worry: Not on file    Inability: Not on file  . Transportation needs:     Medical: Not on file    Non-medical: Not on file  Tobacco Use  . Smoking status: Current Every Day Smoker    Packs/day: 0.50    Years: 20.00    Pack years: 10.00    Types: Cigarettes  . Smokeless tobacco: Never Used  Substance and Sexual Activity  . Alcohol use: Yes    Comment: very rare  . Drug use: No  . Sexual activity: Not Currently    Birth control/protection: None  Lifestyle  . Physical activity:    Days per week: Not on file    Minutes per session: Not on file  . Stress: Not on file  Relationships  . Social connections:    Talks on phone: Not on file    Gets together: Not on file  Attends religious service: Not on file    Active member of club or organization: Not on file    Attends meetings of clubs or organizations: Not on file    Relationship status: Not on file  . Intimate partner violence:    Fear of current or ex partner: Not on file    Emotionally abused: Not on file    Physically abused: Not on file    Forced sexual activity: Not on file  Other Topics Concern  . Not on file  Social History Narrative   Lives with parents in a one story home.  No children.     Currently not working.  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    Education: college    Past Medical History, Surgical history, Social history, and Family history were reviewed and updated as appropriate.   Please see review of systems for further details on the patient's review from today.   Objective:   Physical Exam:  BP 129/83   Pulse 87   Physical Exam Constitutional:      General: She is not in acute distress.    Appearance: She is well-developed.  Musculoskeletal:        General: No deformity.  Neurological:     Mental Status: She is alert and oriented to person, place, and time.     Coordination: Coordination normal.  Psychiatric:        Attention and Perception: Attention and perception normal. She does not perceive auditory or visual hallucinations.         Mood and Affect: Mood is anxious and depressed. Affect is tearful. Affect is not labile, blunt, angry or inappropriate.        Speech: Speech normal.        Behavior: Behavior normal.        Thought Content: Thought content normal. Thought content does not include homicidal or suicidal ideation. Thought content does not include homicidal or suicidal plan.        Cognition and Memory: Cognition and memory normal.        Judgment: Judgment normal.     Comments: Insight intact. No delusions.      Lab Review:     Component Value Date/Time   NA 141 11/26/2015 0440   K 3.6 11/26/2015 0440   CL 110 11/26/2015 0440   CO2 23 11/26/2015 0440   GLUCOSE 81 11/26/2015 0440   BUN 6 11/26/2015 0440   CREATININE 0.92 11/26/2015 0440   CALCIUM 8.8 (L) 11/26/2015 0440   PROT 6.0 (L) 11/26/2015 0440   ALBUMIN 3.4 (L) 11/26/2015 0440   AST 13 (L) 11/26/2015 0440   ALT 10 (L) 11/26/2015 0440   ALKPHOS 40 11/26/2015 0440   BILITOT 0.4 11/26/2015 0440   GFRNONAA >60 11/26/2015 0440   GFRAA >60 11/26/2015 0440       Component Value Date/Time   WBC 9.1 11/26/2015 0440   RBC 3.88 11/26/2015 0440   HGB 12.3 11/26/2015 0440   HCT 35.9 (L) 11/26/2015 0440   PLT 308 11/26/2015 0440   MCV 92.5 11/26/2015 0440   MCH 31.7 11/26/2015 0440   MCHC 34.3 11/26/2015 0440   RDW 13.1 11/26/2015 0440   LYMPHSABS 2.9 11/24/2015 1830   MONOABS 1.0 11/24/2015 1830   EOSABS 0.8 (H) 11/24/2015 1830   BASOSABS 0.0 11/24/2015 1830    No results found for: POCLITH, LITHIUM   No results found for: PHENYTOIN, PHENOBARB, VALPROATE, CBMZ   .res Assessment:  Plan:   Will continue Ritalin 10 mg 2-3 times daily for attention deficit. Will continue Klonopin 1 mg twice daily as needed for anxiety. Continue Ambien 10 mg at bedtime as needed for insomnia. Recommend continuing to see Lina Sayre, [facility] for therapy. PTSD (post-traumatic stress disorder)  Depression, unspecified depression type  Insomnia due to other  mental disorder  Attention deficit hyperactivity disorder (ADHD), combined type - Plan: methylphenidate (RITALIN) 10 MG tablet, methylphenidate (RITALIN) 10 MG tablet  Please see After Visit Summary for patient specific instructions.  Future Appointments  Date Time Provider [place]  03/31/2018 12:00 PM Lina Sayre, [place] CP-CP None  04/18/2018  9:00 AM Lina Sayre, LPC CP-CP None  05/02/2018  1:00 PM Lina Sayre, LPC CP-CP None  05/16/2018  1:00 PM Lina Sayre, LPC CP-CP None  06/22/2018  8:30 AM Thayer Headings, PMHNP CP-CP None    No orders of the defined types were placed in this encounter.     -------------------------------

## 2018-03-31 ENCOUNTER — Encounter: Payer: Self-pay | Admitting: Psychiatry

## 2018-03-31 ENCOUNTER — Other Ambulatory Visit: Payer: Self-pay

## 2018-03-31 ENCOUNTER — Ambulatory Visit: Payer: BLUE CROSS/BLUE SHIELD | Admitting: Psychiatry

## 2018-03-31 DIAGNOSIS — F4310 Post-traumatic stress disorder, unspecified: Secondary | ICD-10-CM

## 2018-03-31 NOTE — Progress Notes (Signed)
      [facility] Counselor/Therapist Progress Note  Patient ID: Laura Simmons, MRN: [id number],    Date: 03/31/2018  Time Spent: 52 minutes  Treatment Type: Individual Therapy  Reported Symptoms: anxiety, panic attack, sleep issues, flashbacks  Mental Status Exam:  Appearance:   Casual     Behavior:  Appropriate  Motor:  Normal  Speech/Language:   Normal Rate  Affect:  Appropriate  Mood:  anxious  Thought process:  circumstantial  Thought content:    Rumination  Sensory/Perceptual disturbances:    Flashback  Orientation:  oriented to person, place, time/date and situation  Attention:  Fair  Concentration:  Fair  Memory:  WNL  Fund of knowledge:   Fair  Insight:    Fair  Judgment:   Fair  Impulse Control:  Fair   Risk Assessment: Danger to Self:  No Self-injurious Behavior: No Danger to Others: No Duty to Warn:no Physical Aggression / Violence:No  Access to Firearms a concern: No  Gang Involvement:No   Subjective: Patient was present for session.  Patient reported she had been struggling because Sunday, March 15 is the anniversary date of Drew's suicide.  Patient explained that she really wants to be able to move on with her life.  Did E MDR set on that date.  Suds level 10, negative cognition "it is my fault" felt sadness and guilt in her back chest and stomach.  Patient was able to reduce suds level to 4.  Patient was able to recognize that she had done what she could to love him and support him.  Patient was reminded that people have their choices and she has to recognize that she cannot control what their choices will be.  CBT skills for her to start using to help her let go were discussed in session.  We will continue working on issue at next session.  Interventions: Cognitive Behavioral Therapy, Solution-Oriented/Positive Psychology and Eye Movement Desensitization and Reprocessing (EMDR)  Diagnosis:   ICD-10-CM   1. PTSD (post-traumatic stress disorder) F43.10      Plan: 1.  Patient to continue to engage in individual counseling 2-4 times a month or as needed. 2.  Patient to identify and apply CBT, coping skills learned in session to decrease triggered responses and anxiety symptoms. 3.  Patient to contact this office, go to the local ED or call 911 if a crisis or emergency develops between visits.  Lina Sayre, [place]     This record has been created using [employer].  Chart creation errors have been sought, but may not always have been located and corrected. Such creation errors do not reflect on the standard of medical care.

## 2018-04-18 ENCOUNTER — Ambulatory Visit: Payer: BLUE CROSS/BLUE SHIELD | Admitting: Psychiatry

## 2018-04-18 ENCOUNTER — Other Ambulatory Visit: Payer: Self-pay

## 2018-05-02 ENCOUNTER — Ambulatory Visit: Payer: BLUE CROSS/BLUE SHIELD | Admitting: Psychiatry

## 2018-05-02 ENCOUNTER — Other Ambulatory Visit: Payer: Self-pay

## 2018-05-03 ENCOUNTER — Ambulatory Visit (INDEPENDENT_AMBULATORY_CARE_PROVIDER_SITE_OTHER): Payer: BLUE CROSS/BLUE SHIELD | Admitting: Psychiatry

## 2018-05-03 ENCOUNTER — Encounter: Payer: Self-pay | Admitting: Psychiatry

## 2018-05-03 ENCOUNTER — Other Ambulatory Visit: Payer: Self-pay

## 2018-05-03 DIAGNOSIS — F331 Major depressive disorder, recurrent, moderate: Secondary | ICD-10-CM | POA: Diagnosis not present

## 2018-05-03 DIAGNOSIS — F4310 Post-traumatic stress disorder, unspecified: Secondary | ICD-10-CM | POA: Diagnosis not present

## 2018-05-03 NOTE — Progress Notes (Signed)
[facility] Counselor/Therapist Progress Note  Patient ID: Laura Simmons, MRN: [id number],    Date: 05/03/2018  Time Spent: 52 minutes Start time 12:01 PM end time 12:53 PM Virtual Visit via Telephone Note I connected with patient by a video enabled telemedicine application or telephone, with their informed consent, and verified patient privacy and that I am speaking with the correct person using two identifiers. I discussed the limitations, risks, security and privacy concerns of performing psychotherapy and management service by telephone and the availability of in person appointments. I also discussed with the patient that there may be a patient responsible charge related to this service. The patient expressed understanding and agreed to proceed. I discussed the treatment planning with the patient. The patient was provided an opportunity to ask questions and all were answered. The patient agreed with the plan and demonstrated an understanding of the instructions. The patient was advised to call  our office if  symptoms worsen or feel they are in a crisis state and need immediate contact. Patient was at her home and clinician was at her home  Treatment Type: Individual Therapy  Reported Symptoms: anxiety, panic attacks, sadness, triggered responses, crying spells, unresolved grief  Mental Status Exam:  Appearance:   NA     Behavior:  Sharing  Motor:  NA  Speech/Language:   Normal Rate  Affect:  Congruent  Mood:  sad  Thought process:  normal  Thought content:    Rumination  Sensory/Perceptual disturbances:    WNL  Orientation:  oriented to person, place, time/date and situation  Attention:  Good  Concentration:  Good  Memory:  Immediate;   [place] of knowledge:   Good  Insight:    Good  Judgment:   Good  Impulse Control:  Good   Risk Assessment: Danger to Self:  No Self-injurious Behavior: No Danger to Others: No Duty to Warn:no Physical Aggression / Violence:No   Access to Firearms a concern: No  Gang Involvement:No   Subjective: Met with patient via phone.  Patient explains she is been very upset over the past few weeks.  She explained that there have been anniversary dates to her boyfriend's death which have triggered lots of emotion for her.  She had also started a new relationship and as she did it she had lots of triggers which overwhelmed her and she recognized she was starting to sabotage the relationship.  Patient was allowed time to just express her feelings and talk through what she recognized was occurring.  Patient was encouraged to try and focus on taking things 1 step at a time rather than making decisions about her future when she does not even have all the information.  Patient was able to acknowledge she was doing that and admitted her fear of being hurting him was making it difficult down and not panic.  CBT strategies to help her do that were discussed with patient and the importance of using her skills to keep herself grounded was discussed as well.  Acknowledge that one positive thing that is come from this is that she has been able to be there for her mother and father at that as they have both been struggling with health issues and that has given her a chance to feel a purpose again.  Patient did report she is hoping to try and find a job as soon as everything gets back to normal.  She explained she is ready to try and start moving forward  in her life.  Reading to continue working on that goal at next session  Interventions: Cognitive Behavioral Therapy and Solution-Oriented/Positive Psychology  Diagnosis:   ICD-10-CM   1. PTSD (post-traumatic stress disorder) F43.10   2. Major depressive disorder, recurrent episode, moderate (HCC) F33.1     Plan: 1.  Patient to continue to engage in individual counseling 2-4 times a month or as needed. 2.  Patient to identify and apply CBT, coping skills learned in session to decrease depression and  anxiety symptoms. 3.  Patient to contact this office, go to the local ED or call 911 if a crisis or emergency develops between visits.  Lina Sayre, [facility]   This record has been created using [employer].  Chart creation errors have been sought, but may not always have been located and corrected. Such creation errors do not reflect on the standard of medical care.

## 2018-05-11 ENCOUNTER — Other Ambulatory Visit: Payer: Self-pay | Admitting: Psychiatry

## 2018-05-11 ENCOUNTER — Telehealth: Payer: Self-pay | Admitting: Psychiatry

## 2018-05-11 DIAGNOSIS — F4310 Post-traumatic stress disorder, unspecified: Secondary | ICD-10-CM

## 2018-05-11 DIAGNOSIS — F5105 Insomnia due to other mental disorder: Secondary | ICD-10-CM

## 2018-05-11 DIAGNOSIS — F99 Mental disorder, not otherwise specified: Principal | ICD-10-CM

## 2018-05-11 MED ORDER — ZOLPIDEM TARTRATE 10 MG PO TABS: 10.0000 mg | ORAL_TABLET | Freq: Every evening | ORAL | 5 refills | Status: DC | PRN

## 2018-05-11 NOTE — Telephone Encounter (Signed)
Pt is requesting a RF on Ambien Rx.  To [facility] on [address] She is having a difficult time sleeping.

## 2018-05-13 ENCOUNTER — Telehealth: Payer: Self-pay | Admitting: Psychiatry

## 2018-05-13 DIAGNOSIS — F4310 Post-traumatic stress disorder, unspecified: Secondary | ICD-10-CM

## 2018-05-13 MED ORDER — CLONAZEPAM 1 MG PO TABS: ORAL_TABLET | ORAL | 2 refills | Status: DC

## 2018-05-13 NOTE — Telephone Encounter (Signed)
Pt left v-mail requested refill on Clonazepam week ago. Advised in doctor approval status. Please advise.

## 2018-05-16 ENCOUNTER — Ambulatory Visit (INDEPENDENT_AMBULATORY_CARE_PROVIDER_SITE_OTHER): Payer: BLUE CROSS/BLUE SHIELD | Admitting: Psychiatry

## 2018-05-16 ENCOUNTER — Other Ambulatory Visit: Payer: Self-pay

## 2018-05-16 DIAGNOSIS — F4310 Post-traumatic stress disorder, unspecified: Secondary | ICD-10-CM

## 2018-05-16 NOTE — Progress Notes (Signed)
[facility] Counselor/Therapist Progress Note  Patient ID: Laura Simmons, MRN: [id number],    Date: 05/19/2018  Time Spent: 52 minutes start time 1:00 PM end time 1:52 PM Virtual Visit via Telephone Note Connected with patient by a video enabled telemedicine/telehealth application or telephone, with their informed consent, and verified patient privacy and that I am speaking with the correct person using two identifiers. I discussed the limitations, risks, security and privacy concerns of performing psychotherapy and management service by telephone and the availability of in person appointments. I also discussed with the patient that there may be a patient responsible charge related to this service. The patient expressed understanding and agreed to proceed. I discussed the treatment planning with the patient. The patient was provided an opportunity to ask questions and all were answered. The patient agreed with the plan and demonstrated an understanding of the instructions. The patient was advised to call  our office if  symptoms worsen or feel they are in a crisis state and need immediate contact.  Therapist Location: home Patient Location: home  Treatment Type: Individual Therapy  Reported Symptoms: anxiety, depression, panic, crying spells, triggered responses, sleep issues  Mental Status Exam:  Appearance:   NA     Behavior:  Sharing  Motor:  NA  Speech/Language:   Normal Rate  Affect:  NA  Mood:  anxious and depressed  Thought process:  circumstantial  Thought content:    Rumination  Sensory/Perceptual disturbances:    Headache   Orientation:  oriented to person, place, time/date and situation  Attention:  Fair  Concentration:  Fair  Memory:  Immediate;   [place] of knowledge:   Fair  Insight:    Fair  Judgment:   Fair  Impulse Control:  Fair   Risk Assessment: Danger to Self:  No Self-injurious Behavior: No Danger to Others: No Duty to Warn:no Physical  Aggression / Violence:No  Access to Firearms a concern: No  Gang Involvement:No   Subjective: Met with pt via phone.  Patient reported she is continuing to have lots of struggles.  She shared that this week she is noticing more grief starting to surface and she has had more crying spells.  Discussed why that may be occurring and the importance of allowing it to be released in a healthy manner.  Patient was encouraged to try and find ways for her to get out of her home and to take time to just get in the sunshine whenever possible.  She was also encouraged to start journaling again and to release the writings through tearing them up or burning him.  Patient shared that she is concerned about her parents and even when she went out to get things she had a panic attack for fear of bringing something back to them and losing them as well.  Patient reported she was able to utilize her coping skills and that got her through it so she could get what she needed and get back home.  Patient was encouraged to continue utilizing coping skills that are helping her and to take medication as directed.  Agreed to work on issues further at next session.  Interventions: Cognitive Behavioral Therapy and Solution-Oriented/Positive Psychology  Diagnosis:   ICD-10-CM   1. PTSD (post-traumatic stress disorder) F43.10     Plan: 1.  Patient to continue to engage in individual counseling 2-4 times a month or as needed. 2.  Patient to identify and apply CBT, coping skills learned in session  to decrease triggered symptoms and anxiety symptoms. 3.  Patient to contact this office, go to the local ED or call 911 if a crisis or emergency develops between visits.  Lina Sayre, [facility], [place]      This record has been created using [employer].  Chart creation errors have been sought, but may not always have been located and corrected. Such creation errors do not reflect on the standard of medical care.

## 2018-05-19 ENCOUNTER — Encounter: Payer: Self-pay | Admitting: Psychiatry

## 2018-05-26 ENCOUNTER — Telehealth: Payer: Self-pay | Admitting: Psychiatry

## 2018-05-26 DIAGNOSIS — F902 Attention-deficit hyperactivity disorder, combined type: Secondary | ICD-10-CM

## 2018-05-26 MED ORDER — METHYLPHENIDATE HCL 10 MG PO TABS: ORAL_TABLET | ORAL | 0 refills | Status: DC

## 2018-05-26 NOTE — Telephone Encounter (Signed)
Patient need refill on (Methylphemidate) Ritalin send to [facility] on [address]. has appt., 06/03

## 2018-05-30 ENCOUNTER — Ambulatory Visit (INDEPENDENT_AMBULATORY_CARE_PROVIDER_SITE_OTHER): Payer: BLUE CROSS/BLUE SHIELD | Admitting: Psychiatry

## 2018-05-30 ENCOUNTER — Other Ambulatory Visit: Payer: Self-pay

## 2018-05-30 DIAGNOSIS — F331 Major depressive disorder, recurrent, moderate: Secondary | ICD-10-CM

## 2018-05-30 DIAGNOSIS — F4310 Post-traumatic stress disorder, unspecified: Secondary | ICD-10-CM | POA: Diagnosis not present

## 2018-05-30 NOTE — Progress Notes (Signed)
[facility] Counselor/Therapist Progress Note  Patient ID: Laura Simmons, MRN: [id number],    Date: 05/31/2018  Time Spent: 52 minutes start time 5:00 PM end time 5:52 PM Virtual Visit via Telephone Note Connected with patient by a video enabled telemedicine/telehealth application or telephone, with their informed consent, and verified patient privacy and that I am speaking with the correct person using two identifiers. I discussed the limitations, risks, security and privacy concerns of performing psychotherapy and management service by telephone and the availability of in person appointments. I also discussed with the patient that there may be a patient responsible charge related to this service. The patient expressed understanding and agreed to proceed. I discussed the treatment planning with the patient. The patient was provided an opportunity to ask questions and all were answered. The patient agreed with the plan and demonstrated an understanding of the instructions. The patient was advised to call  our office if  symptoms worsen or feel they are in a crisis state and need immediate contact.   Therapist Location: home Patient Location: home     Treatment Type: Individual Therapy  Reported Symptoms: anxiety, sadness , back pain, sleep issues, crying spells, flashbacks, grief issues  Mental Status Exam:  Appearance:   NA     Behavior:  Sharing  Motor:  na  Speech/Language:   Normal Rate  Affect:  NA  Mood:  sad  Thought process:  circumstantial  Thought content:    Rumination  Sensory/Perceptual disturbances:    pain  Orientation:  oriented to person, place, time/date and situation  Attention:  Good  Concentration:  Good  Memory:  Immediate;   [place] of knowledge:   Good  Insight:    Good  Judgment:   Good  Impulse Control:  Good   Risk Assessment: Danger to Self:  No Self-injurious Behavior: No Danger to Others: No Duty to Warn:no Physical Aggression /  Violence:No  Access to Firearms a concern: No  Gang Involvement:No   Subjective: Met with patient via phone.  She reported she has been upset and continues to have lots of anxiety over the past few weeks.  She shared that she had another patient to pass away which is triggering lots of loss and grief issues.  Patient also explained that the relationship with her friend had ended and that has left her feeling very sad because thinking about a relationship was given her something positive to focus on.  Encourage patient to realize that the relationship was still positive but is because it was the first time she was able to realize she could care about somebody again since Drew's death.  Patient reported she has had more thoughts about the positive parts of their relationship, the beginning part before he started drinking and using drugs.  Patient was encouraged to realize that that may be because she is hoping to get back to a relationship that was like that one in particular.  The importance of reminding herself that she will be happy again and things will work out were discussed with patient.  Patient was also encouraged to find ways to release her emotions appropriately on a regular basis, different strategies were discussed with patient.  Patient reported she has been trying to organize some of her mother's closets and found that helpful.  Patient also reported using the grounded 5 technique and that that is helped her to keep panic attacks to a minimum.  Patient was encouraged to use her  CBT filters and to remind herself of the truth every day that she is enough and she matters and that sometimes bad things happen but that they are not her fault.  Other ways for her to talk her self through the difficult emotions were discussed in session and plans were developed with patient.  Interventions: Cognitive Behavioral Therapy and Solution-Oriented/Positive Psychology  Diagnosis:   ICD-10-CM   1. PTSD  (post-traumatic stress disorder) F43.10   2. Major depressive disorder, recurrent episode, moderate (HCC) F33.1     Plan: 1.  Patient to continue to engage in individual counseling 2-4 times a month or as needed. 2.  Patient to identify and apply CBT, coping skills learned in session to decrease depression and anxiety symptoms. 3.  Patient to contact this office, go to the local ED or call 911 if a crisis or emergency develops between visits.  Lina Sayre, [facility]  This record has been created using [employer].  Chart creation errors have been sought, but may not always have been located and corrected. Such creation errors do not reflect on the standard of medical care.

## 2018-05-31 ENCOUNTER — Encounter: Payer: Self-pay | Admitting: Psychiatry

## 2018-06-07 ENCOUNTER — Other Ambulatory Visit: Payer: Self-pay

## 2018-06-07 ENCOUNTER — Ambulatory Visit: Payer: BLUE CROSS/BLUE SHIELD | Admitting: Psychiatry

## 2018-06-22 ENCOUNTER — Ambulatory Visit: Payer: BLUE CROSS/BLUE SHIELD | Admitting: Psychiatry

## 2018-07-01 DIAGNOSIS — M25511 Pain in right shoulder: Secondary | ICD-10-CM | POA: Diagnosis not present

## 2018-07-08 DIAGNOSIS — M25511 Pain in right shoulder: Secondary | ICD-10-CM | POA: Diagnosis not present

## 2018-07-11 ENCOUNTER — Ambulatory Visit: Payer: BC Managed Care – PPO | Admitting: Psychiatry

## 2018-07-11 ENCOUNTER — Other Ambulatory Visit: Payer: Self-pay

## 2018-07-11 ENCOUNTER — Encounter: Payer: Self-pay | Admitting: Psychiatry

## 2018-07-11 VITALS — BP 130/106 | HR 108

## 2018-07-11 DIAGNOSIS — F99 Mental disorder, not otherwise specified: Secondary | ICD-10-CM

## 2018-07-11 DIAGNOSIS — F5105 Insomnia due to other mental disorder: Secondary | ICD-10-CM

## 2018-07-11 DIAGNOSIS — F902 Attention-deficit hyperactivity disorder, combined type: Secondary | ICD-10-CM

## 2018-07-11 DIAGNOSIS — F4310 Post-traumatic stress disorder, unspecified: Secondary | ICD-10-CM | POA: Diagnosis not present

## 2018-07-11 MED ORDER — CLONAZEPAM 1 MG PO TABS: ORAL_TABLET | ORAL | 2 refills | Status: DC

## 2018-07-11 MED ORDER — METHYLPHENIDATE HCL 10 MG PO TABS: ORAL_TABLET | ORAL | 0 refills | Status: DC

## 2018-07-11 NOTE — Progress Notes (Signed)
Laura Simmons [id number] 08-Jul-1975 43 y.o.  Subjective:   Patient ID:  Laura Simmons is a 43 y.o. (DOB 23-Dec-1975) female.  Chief Complaint:  Chief Complaint  Patient presents with  . Follow-up    Anxiety, Depression, insomnia, ADD    HPI Laura Simmons presents to the office today for follow-up of anxiety, depression, and ADD. She reports that she has been "digging deep" and working through some past issues and personal growth. She reports that she has been crying frequently and reports "I think it is a lot of stuff that has been boxed up finally coming out." Reports that crying seems to be more related to processing things versus depression. Reports frustration r/t not being able to do certain things to help her parents. She reports that she had a panic attack in the parking lot of a store when first shopping after quarantine. Continues to have anxiety with being in public and prefers to have someone with her. Reports that her sleep has been "off and on" and sleep has been interrupted due to pain. She reports that her appetite is "ok." Reports that she has been avoiding eating at times due to IBS s/s. She reports that her energy has been ok. Motivation has been ok during the quarantine and is now thinking about where she needs to direct her motivation. Concentration has been adequate. Denies SI.  She reports that others have noticed she has been thinking more positively.   Reports that her mother had surgery and had multiple complications. Father has also been having health issues. Uncle died a month ago.   Past Psychiatric Medication Trials: Zyprexa- worsening depression Trileptal- benefits did not outweigh risks Ritalin Remeron Seroquel- RLS Depakote Trazodone- excessive daytime somnolence Klonopin Ambien  Review of Systems:  Review of Systems  Musculoskeletal: Negative for gait problem.       Shoulder pain.  Neurological: Negative for tremors.  Psychiatric/Behavioral:        Please refer to HPI    Medications: I have reviewed the patient's current medications.  Current Outpatient Medications  Medication Sig Dispense Refill  . Aspirin-Salicylamide-Caffeine (BC HEADACHE POWDER PO) Take by mouth as needed.    . calcium carbonate (TUMS) 500 MG chewable tablet Chew 1 tablet by mouth as needed for indigestion or heartburn.    Derrill Memo ON 08/08/2018] clonazePAM (KLONOPIN) 1 MG tablet TAKE ONE-HALF TO ONE TABLET BY MOUTH TWICE DAILY AS NEEDED FOR ANXIETY 60 tablet 2  . dimenhyDRINATE (DRAMAMINE) 50 MG tablet Take 50 mg by mouth at bedtime as needed for nausea.    Marland Kitchen docusate sodium (COLACE) 100 MG capsule Take 1 capsule (100 mg total) by mouth 2 (two) times daily. 30 capsule 2  . [START ON 09/19/2018] methylphenidate (RITALIN) 10 MG tablet Take 1 tab two to three times daily. 90 tablet 0  . [START ON 08/22/2018] methylphenidate (RITALIN) 10 MG tablet Take 1 tablet two to three times daily 90 tablet 0  . [START ON 07/25/2018] methylphenidate (RITALIN) 10 MG tablet Take 1 tablet two to three times daily 90 tablet 0  . ondansetron (ZOFRAN) 4 MG tablet Take 4 mg by mouth 2 (two) times daily as needed for nausea or vomiting.    . pravastatin (PRAVACHOL) 40 MG tablet Take 1 tablet (40 mg total) by mouth daily. 90 tablet 1  . simethicone (MYLICON) 742 MG chewable tablet Chew 125 mg by mouth every 6 (six) hours as needed for flatulence.    Marland Kitchen zolpidem (AMBIEN) 10 MG tablet Take  1 tablet (10 mg total) by mouth at bedtime as needed for sleep. 30 tablet 5   No current facility-administered medications for this visit.     Medication Side Effects: None  Allergies:  Allergies  Allergen Reactions  . Sumatriptan Succinate Anaphylaxis and Other (See Comments)    ALL TRIPTAN MEDS  . Triptans Anaphylaxis and Other (See Comments)    Pt states throat feels as if it is closing.  . Compazine [Prochlorperazine] Other (See Comments)    lockjaw  . Ketamine Other (See Comments)    "Pt.  Reports she never wants again." major hallucinations  . Reglan [Metoclopramide] Other (See Comments)    "crawling out of my skin"    Past Medical History:  Diagnosis Date  . ADHD (attention deficit hyperactivity disorder)   . Agoraphobia   . Bipolar 1 disorder ([place])    patient denies  . Chronic pain    just migraines per patient  . Endometriosis determined by laparoscopy   . GERD (gastroesophageal reflux disease)   . High platelet count   . Migraine   . Personality disorder ([place])   . Right adrenal mass ([place])    per ct 09-06-2015  . Right ovarian cyst   . Vaginal Pap smear, abnormal     Family History  Problem Relation Age of Onset  . Migraines Mother   . Anxiety disorder Mother   . Diabetes Maternal Grandmother   . Stroke Maternal Grandfather   . Heart attack Paternal Grandfather   . Anxiety disorder Paternal Aunt   . Post-traumatic stress disorder Cousin        After [place]    Social History   Socioeconomic History  . Marital status: Single    Spouse name: Not on file  . Number of children: Not on file  . Years of education: Not on file  . Highest education level: Not on file  Occupational History  . Not on file  Social Needs  . Financial resource strain: Not on file  . Food insecurity    Worry: Not on file    Inability: Not on file  . Transportation needs    Medical: Not on file    Non-medical: Not on file  Tobacco Use  . Smoking status: Current Every Day Smoker    Packs/day: 0.50    Years: 20.00    Pack years: 10.00    Types: Cigarettes  . Smokeless tobacco: Never Used  Substance and Sexual Activity  . Alcohol use: Yes    Comment: very rare  . Drug use: No  . Sexual activity: Not Currently    Birth control/protection: None  Lifestyle  . Physical activity    Days per week: Not on file    Minutes per session: Not on file  . Stress: Not on file  Relationships  . Social Herbalist on phone: Not on file    Gets together: Not on  file    Attends religious service: Not on file    Active member of club or organization: Not on file    Attends meetings of clubs or organizations: Not on file    Relationship status: Not on file  . Intimate partner violence    Fear of current or ex partner: Not on file    Emotionally abused: Not on file    Physically abused: Not on file    Forced sexual activity: Not on file  Other Topics Concern  . Not on file  Social  History Narrative   Lives with parents in a one story home.  No children.     Currently not working.  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    Education: college    Past Medical History, Surgical history, Social history, and Family history were reviewed and updated as appropriate.   Please see review of systems for further details on the patient's review from today.   Objective:   Pt in acute pain at time of exam.   Physical Exam:  BP (!) 130/106   Pulse (!) 108   Physical Exam Constitutional:      General: She is not in acute distress.    Appearance: She is well-developed.  Musculoskeletal:        General: No deformity.  Neurological:     Mental Status: She is alert and oriented to person, place, and time.     Coordination: Coordination normal.  Psychiatric:        Attention and Perception: Attention and perception normal. She does not perceive auditory or visual hallucinations.        Mood and Affect: Mood is depressed. Mood is not anxious. Affect is tearful. Affect is not labile, blunt, angry or inappropriate.        Speech: Speech normal.        Behavior: Behavior normal.        Thought Content: Thought content normal. Thought content does not include homicidal or suicidal ideation. Thought content does not include homicidal or suicidal plan.        Cognition and Memory: Cognition and memory normal.        Judgment: Judgment normal.     Comments: Insight intact. No delusions.      Lab Review:     Component Value Date/Time    NA 141 11/26/2015 0440   K 3.6 11/26/2015 0440   CL 110 11/26/2015 0440   CO2 23 11/26/2015 0440   GLUCOSE 81 11/26/2015 0440   BUN 6 11/26/2015 0440   CREATININE 0.92 11/26/2015 0440   CALCIUM 8.8 (L) 11/26/2015 0440   PROT 6.0 (L) 11/26/2015 0440   ALBUMIN 3.4 (L) 11/26/2015 0440   AST 13 (L) 11/26/2015 0440   ALT 10 (L) 11/26/2015 0440   ALKPHOS 40 11/26/2015 0440   BILITOT 0.4 11/26/2015 0440   GFRNONAA >60 11/26/2015 0440   GFRAA >60 11/26/2015 0440       Component Value Date/Time   WBC 9.1 11/26/2015 0440   RBC 3.88 11/26/2015 0440   HGB 12.3 11/26/2015 0440   HCT 35.9 (L) 11/26/2015 0440   PLT 308 11/26/2015 0440   MCV 92.5 11/26/2015 0440   MCH 31.7 11/26/2015 0440   MCHC 34.3 11/26/2015 0440   RDW 13.1 11/26/2015 0440   LYMPHSABS 2.9 11/24/2015 1830   MONOABS 1.0 11/24/2015 1830   EOSABS 0.8 (H) 11/24/2015 1830   BASOSABS 0.0 11/24/2015 1830    No results found for: POCLITH, LITHIUM   No results found for: PHENYTOIN, PHENOBARB, VALPROATE, CBMZ   .res Assessment: Plan:   Will continue current plan of care. Will continue Ambien 10 mg po QHS for insomnia.   Laura Simmons was seen today for follow-up.  Diagnoses and all orders for this visit:  Insomnia due to other mental disorder  Attention deficit hyperactivity disorder (ADHD), combined type -     methylphenidate (RITALIN) 10 MG tablet; Take 1 tab two to three times daily. -     methylphenidate (RITALIN) 10 MG tablet;  Take 1 tablet two to three times daily -     methylphenidate (RITALIN) 10 MG tablet; Take 1 tablet two to three times daily  PTSD (post-traumatic stress disorder) -     clonazePAM (KLONOPIN) 1 MG tablet; TAKE ONE-HALF TO ONE TABLET BY MOUTH TWICE DAILY AS NEEDED FOR ANXIETY     Please see After Visit Summary for patient specific instructions.  Future Appointments  Date Time Provider [place]  07/28/2018  1:00 PM Lina Sayre, [facility] None  10/12/2018  1:00 PM Thayer Headings, PM[facility]NP CP-CP None    No orders of the defined types were placed in this encounter.   -------------------------------

## 2018-07-15 DIAGNOSIS — M25511 Pain in right shoulder: Secondary | ICD-10-CM | POA: Diagnosis not present

## 2018-07-15 DIAGNOSIS — M75121 Complete rotator cuff tear or rupture of right shoulder, not specified as traumatic: Secondary | ICD-10-CM | POA: Diagnosis not present

## 2018-07-26 ENCOUNTER — Other Ambulatory Visit: Payer: Self-pay

## 2018-07-26 MED ORDER — ONDANSETRON HCL 4 MG PO TABS: 4.0000 mg | ORAL_TABLET | Freq: Two times a day (BID) | ORAL | 0 refills | Status: DC | PRN

## 2018-07-28 ENCOUNTER — Ambulatory Visit: Payer: BC Managed Care – PPO | Admitting: Psychiatry

## 2018-08-02 ENCOUNTER — Observation Stay (HOSPITAL_COMMUNITY): Payer: BC Managed Care – PPO

## 2018-08-02 ENCOUNTER — Other Ambulatory Visit: Payer: Self-pay

## 2018-08-02 ENCOUNTER — Inpatient Hospital Stay (HOSPITAL_COMMUNITY)
Admission: EM | Admit: 2018-08-02 | Discharge: 2018-08-08 | DRG: 897 | Disposition: A | Discharge status: Home / Self Care | Payer: BC Managed Care – PPO | Attending: Internal Medicine | Admitting: Internal Medicine

## 2018-08-02 ENCOUNTER — Emergency Department (HOSPITAL_COMMUNITY): Payer: BC Managed Care – PPO

## 2018-08-02 ENCOUNTER — Encounter (HOSPITAL_COMMUNITY): Payer: Self-pay | Admitting: Emergency Medicine

## 2018-08-02 DIAGNOSIS — R4182 Altered mental status, unspecified: Secondary | ICD-10-CM | POA: Diagnosis not present

## 2018-08-02 DIAGNOSIS — G43909 Migraine, unspecified, not intractable, without status migrainosus: Secondary | ICD-10-CM | POA: Diagnosis present

## 2018-08-02 DIAGNOSIS — Z823 Family history of stroke: Secondary | ICD-10-CM

## 2018-08-02 DIAGNOSIS — F10230 Alcohol dependence with withdrawal, uncomplicated: Secondary | ICD-10-CM | POA: Diagnosis present

## 2018-08-02 DIAGNOSIS — E872 Acidosis, unspecified: Secondary | ICD-10-CM | POA: Diagnosis present

## 2018-08-02 DIAGNOSIS — F39 Unspecified mood [affective] disorder: Secondary | ICD-10-CM | POA: Diagnosis present

## 2018-08-02 DIAGNOSIS — A419 Sepsis, unspecified organism: Secondary | ICD-10-CM

## 2018-08-02 DIAGNOSIS — F4000 Agoraphobia, unspecified: Secondary | ICD-10-CM | POA: Diagnosis present

## 2018-08-02 DIAGNOSIS — G4700 Insomnia, unspecified: Secondary | ICD-10-CM | POA: Diagnosis present

## 2018-08-02 DIAGNOSIS — G894 Chronic pain syndrome: Secondary | ICD-10-CM | POA: Diagnosis present

## 2018-08-02 DIAGNOSIS — N390 Urinary tract infection, site not specified: Secondary | ICD-10-CM | POA: Diagnosis present

## 2018-08-02 DIAGNOSIS — F13230 Sedative, hypnotic or anxiolytic dependence with withdrawal, uncomplicated: Secondary | ICD-10-CM | POA: Diagnosis not present

## 2018-08-02 DIAGNOSIS — G8929 Other chronic pain: Secondary | ICD-10-CM | POA: Diagnosis present

## 2018-08-02 DIAGNOSIS — E161 Other hypoglycemia: Secondary | ICD-10-CM | POA: Diagnosis not present

## 2018-08-02 DIAGNOSIS — Z818 Family history of other mental and behavioral disorders: Secondary | ICD-10-CM

## 2018-08-02 DIAGNOSIS — E785 Hyperlipidemia, unspecified: Secondary | ICD-10-CM | POA: Diagnosis present

## 2018-08-02 DIAGNOSIS — R4189 Other symptoms and signs involving cognitive functions and awareness: Secondary | ICD-10-CM | POA: Diagnosis not present

## 2018-08-02 DIAGNOSIS — Z884 Allergy status to anesthetic agent status: Secondary | ICD-10-CM

## 2018-08-02 DIAGNOSIS — T424X5A Adverse effect of benzodiazepines, initial encounter: Secondary | ICD-10-CM | POA: Diagnosis present

## 2018-08-02 DIAGNOSIS — F4310 Post-traumatic stress disorder, unspecified: Secondary | ICD-10-CM | POA: Diagnosis present

## 2018-08-02 DIAGNOSIS — K581 Irritable bowel syndrome with constipation: Secondary | ICD-10-CM | POA: Diagnosis present

## 2018-08-02 DIAGNOSIS — N809 Endometriosis, unspecified: Secondary | ICD-10-CM | POA: Diagnosis present

## 2018-08-02 DIAGNOSIS — Z888 Allergy status to other drugs, medicaments and biological substances status: Secondary | ICD-10-CM

## 2018-08-02 DIAGNOSIS — F909 Attention-deficit hyperactivity disorder, unspecified type: Secondary | ICD-10-CM | POA: Diagnosis present

## 2018-08-02 DIAGNOSIS — O99284 Endocrine, nutritional and metabolic diseases complicating childbirth: Secondary | ICD-10-CM | POA: Diagnosis present

## 2018-08-02 DIAGNOSIS — R6520 Severe sepsis without septic shock: Secondary | ICD-10-CM

## 2018-08-02 DIAGNOSIS — B9689 Other specified bacterial agents as the cause of diseases classified elsewhere: Secondary | ICD-10-CM | POA: Diagnosis present

## 2018-08-02 DIAGNOSIS — F411 Generalized anxiety disorder: Secondary | ICD-10-CM | POA: Diagnosis present

## 2018-08-02 DIAGNOSIS — Z833 Family history of diabetes mellitus: Secondary | ICD-10-CM

## 2018-08-02 DIAGNOSIS — E162 Hypoglycemia, unspecified: Secondary | ICD-10-CM | POA: Diagnosis not present

## 2018-08-02 DIAGNOSIS — F419 Anxiety disorder, unspecified: Secondary | ICD-10-CM

## 2018-08-02 DIAGNOSIS — R404 Transient alteration of awareness: Secondary | ICD-10-CM | POA: Diagnosis not present

## 2018-08-02 DIAGNOSIS — K219 Gastro-esophageal reflux disease without esophagitis: Secondary | ICD-10-CM | POA: Diagnosis present

## 2018-08-02 DIAGNOSIS — F319 Bipolar disorder, unspecified: Secondary | ICD-10-CM | POA: Diagnosis present

## 2018-08-02 DIAGNOSIS — F17210 Nicotine dependence, cigarettes, uncomplicated: Secondary | ICD-10-CM | POA: Diagnosis present

## 2018-08-02 DIAGNOSIS — Z1159 Encounter for screening for other viral diseases: Secondary | ICD-10-CM

## 2018-08-02 DIAGNOSIS — Z975 Presence of (intrauterine) contraceptive device: Secondary | ICD-10-CM

## 2018-08-02 DIAGNOSIS — R569 Unspecified convulsions: Secondary | ICD-10-CM

## 2018-08-02 DIAGNOSIS — Z8249 Family history of ischemic heart disease and other diseases of the circulatory system: Secondary | ICD-10-CM

## 2018-08-02 DIAGNOSIS — R4020 Unspecified coma: Secondary | ICD-10-CM | POA: Diagnosis not present

## 2018-08-02 DIAGNOSIS — R6521 Severe sepsis with septic shock: Secondary | ICD-10-CM | POA: Diagnosis not present

## 2018-08-02 LAB — URINALYSIS, ROUTINE W REFLEX MICROSCOPIC
Bilirubin Urine: NEGATIVE
Glucose, UA: NEGATIVE mg/dL
Hgb urine dipstick: NEGATIVE
Ketones, ur: 20 mg/dL — AB
Leukocytes,Ua: NEGATIVE
Nitrite: POSITIVE — AB
Protein, ur: NEGATIVE mg/dL
Specific Gravity, Urine: 1.014 (ref 1.005–1.030)
pH: 5 (ref 5.0–8.0)

## 2018-08-02 LAB — RAPID URINE DRUG SCREEN, HOSP PERFORMED
Amphetamines: NOT DETECTED
Barbiturates: NOT DETECTED
Benzodiazepines: NOT DETECTED
Cocaine: NOT DETECTED
Opiates: NOT DETECTED
Tetrahydrocannabinol: NOT DETECTED

## 2018-08-02 LAB — PROTIME-INR
INR: 1 (ref 0.8–1.2)
Prothrombin Time: 13.5 seconds (ref 11.4–15.2)

## 2018-08-02 LAB — COMPREHENSIVE METABOLIC PANEL
ALT: 15 U/L (ref 0–44)
AST: 26 U/L (ref 15–41)
Albumin: 3.9 g/dL (ref 3.5–5.0)
Alkaline Phosphatase: 44 U/L (ref 38–126)
Anion gap: 18 — ABNORMAL HIGH (ref 5–15)
BUN: 19 mg/dL (ref 6–20)
CO2: 13 mmol/L — ABNORMAL LOW (ref 22–32)
Calcium: 9.1 mg/dL (ref 8.9–10.3)
Chloride: 109 mmol/L (ref 98–111)
Creatinine, Ser: 0.92 mg/dL (ref 0.44–1.00)
GFR calc Af Amer: >60 mL/min (ref >60)
GFR calc non Af Amer: >60 mL/min (ref >60)
Glucose, Bld: 156 mg/dL — ABNORMAL HIGH (ref 70–99)
Potassium: 3.8 mmol/L (ref 3.5–5.1)
Sodium: 140 mmol/L (ref 135–145)
Total Bilirubin: 0.5 mg/dL (ref 0.3–1.2)
Total Protein: 6.7 g/dL (ref 6.5–8.1)

## 2018-08-02 LAB — SARS CORONAVIRUS 2 BY RT PCR (HOSPITAL ORDER, PERFORMED IN ~~LOC~~ HOSPITAL LAB): SARS Coronavirus 2: NEGATIVE

## 2018-08-02 LAB — CBC
HCT: 40.1 % (ref 36.0–46.0)
Hemoglobin: 13.4 g/dL (ref 12.0–15.0)
MCH: 32.1 pg (ref 26.0–34.0)
MCHC: 33.4 g/dL (ref 30.0–36.0)
MCV: 96.2 fL (ref 80.0–100.0)
Platelets: 421 10*3/uL — ABNORMAL HIGH (ref 150–400)
RBC: 4.17 MIL/uL (ref 3.87–5.11)
RDW: 12.6 % (ref 11.5–15.5)
WBC: 20.4 10*3/uL — ABNORMAL HIGH (ref 4.0–10.5)
nRBC: 0 % (ref 0.0–0.2)

## 2018-08-02 LAB — LACTIC ACID, PLASMA
Lactic Acid, Venous: 4.7 mmol/L (ref 0.5–1.9)
Lactic Acid, Venous: 1.5 mmol/L (ref 0.5–1.9)
Lactic Acid, Venous: 1.9 mmol/L (ref 0.5–1.9)

## 2018-08-02 LAB — GLUCOSE, CAPILLARY: Glucose-Capillary: 78 mg/dL (ref 70–99)

## 2018-08-02 LAB — CBG MONITORING, ED: Glucose-Capillary: 141 mg/dL — ABNORMAL HIGH (ref 70–99)

## 2018-08-02 LAB — GAMMA GT: GGT: 16 U/L (ref 7–50)

## 2018-08-02 LAB — APTT: aPTT: 24 seconds (ref 24–36)

## 2018-08-02 LAB — I-STAT BETA HCG BLOOD, ED (MC, WL, AP ONLY): I-stat hCG, quantitative: <5 m[IU]/mL (ref <5)

## 2018-08-02 LAB — ETHANOL: Alcohol, Ethyl (B): <10 mg/dL (ref <10)

## 2018-08-02 MED ORDER — DEXTROSE-NACL 5-0.45 % IV SOLN
INTRAVENOUS | Status: DC
  Administered 2018-08-02 – 2018-08-03 (×2): via INTRAVENOUS

## 2018-08-02 MED ORDER — SODIUM CHLORIDE 0.9 % IV BOLUS (SEPSIS)
500.0000 mL | Freq: Once | INTRAVENOUS | Status: AC
  Administered 2018-08-02: 500 mL via INTRAVENOUS

## 2018-08-02 MED ORDER — LORAZEPAM 2 MG/ML IJ SOLN: 2.0000 mg | INTRAMUSCULAR | Status: DC | PRN

## 2018-08-02 MED ORDER — VANCOMYCIN HCL IN DEXTROSE 1-5 GM/200ML-% IV SOLN: 1000.0000 mg | Freq: Once | INTRAVENOUS | Status: DC

## 2018-08-02 MED ORDER — VANCOMYCIN HCL IN DEXTROSE 1-5 GM/200ML-% IV SOLN: 1000.0000 mg | INTRAVENOUS | Status: DC

## 2018-08-02 MED ORDER — LORAZEPAM 0.5 MG PO TABS
0.5000 mg | ORAL_TABLET | ORAL | Status: DC | PRN
  Administered 2018-08-03 – 2018-08-04 (×3): 0.5 mg via ORAL
  Filled 2018-08-02 (×4): qty 1

## 2018-08-02 MED ORDER — METRONIDAZOLE IN NACL 5-0.79 MG/ML-% IV SOLN
500.0000 mg | Freq: Once | INTRAVENOUS | Status: AC
  Administered 2018-08-02: 500 mg via INTRAVENOUS
  Filled 2018-08-02: qty 100

## 2018-08-02 MED ORDER — ENOXAPARIN SODIUM 40 MG/0.4ML ~~LOC~~ SOLN
40.0000 mg | SUBCUTANEOUS | Status: DC
  Administered 2018-08-02 – 2018-08-07 (×6): 40 mg via SUBCUTANEOUS
  Filled 2018-08-02 (×6): qty 0.4

## 2018-08-02 MED ORDER — SODIUM CHLORIDE 0.9 % IV BOLUS (SEPSIS)
250.0000 mL | Freq: Once | INTRAVENOUS | Status: AC
  Administered 2018-08-02: 250 mL via INTRAVENOUS

## 2018-08-02 MED ORDER — SODIUM CHLORIDE 0.9 % IV BOLUS
1000.0000 mL | Freq: Once | INTRAVENOUS | Status: AC
  Administered 2018-08-02: 1000 mL via INTRAVENOUS

## 2018-08-02 MED ORDER — SODIUM CHLORIDE 0.9 % IV BOLUS (SEPSIS)
1000.0000 mL | Freq: Once | INTRAVENOUS | Status: AC
  Administered 2018-08-02: 16:00:00 1000 mL via INTRAVENOUS

## 2018-08-02 MED ORDER — VANCOMYCIN HCL 10 G IV SOLR
1250.0000 mg | Freq: Once | INTRAVENOUS | Status: AC
  Administered 2018-08-02: 16:00:00 1250 mg via INTRAVENOUS
  Filled 2018-08-02: qty 1250

## 2018-08-02 MED ORDER — SODIUM CHLORIDE 0.9 % IV SOLN
2.0000 g | Freq: Three times a day (TID) | INTRAVENOUS | Status: DC
  Administered 2018-08-03: 2 g via INTRAVENOUS
  Filled 2018-08-02 (×3): qty 2

## 2018-08-02 MED ORDER — SODIUM CHLORIDE 0.9 % IV SOLN
2.0000 g | Freq: Once | INTRAVENOUS | Status: AC
  Administered 2018-08-02: 2 g via INTRAVENOUS
  Filled 2018-08-02: qty 2

## 2018-08-02 NOTE — ED Notes (Signed)
Mother-Dorothy-[phone]

## 2018-08-02 NOTE — H&P (Addendum)
Laura Simmons is an 43 y.o. female.   Chief Complaint: Unresponsive HPI: The patient is a 43 yr old woman who lives at home with her parents and does not work. She states that she has a past medical history of endometriosis. She states that she has not seen a doctor in some time. She denies any previous medical history. The patient states that she has a "couple" of drinks last night, but she did not eat much. She states that she awoke, but felt that she couldn't get up out of bed. She states that she thinks that she "had a stroke or seizure, or something". This has happened 4-5 times in the past. She states that this has happened before. She states that she frequently does not eat well due to her endometriosis. She states that she has seen a doctor for this in the past, but that she hasn't seen anyone for a while. She is not on any medications. She denies heavy drinking or illicit drug use.   The patient was brought to the ED by EMS. Her parents called them when the patient was found unresponsive, incontinent and staring to the left side. She was found to have a CBG of 45. She responded to glucagon and a50 cc D10 and was awake and moving spontaneously upon arrival.   In the ED the patient was found to have glucose of 141, lactic acid of 4.7, WBC of 20.4, and anion gap of 18. Ethyl alcohol was less than 10. Urine drug screen and CT head are pending. CXR showed no acute abnormality.  [facility] hospitalists were called to admit the patient. She will be admitted to a telemetry bed. CT head is pending. She will receiv D5 at 100 cc/hour. EEG has been ordered. Repat lactic acid has been ordered.   Past Medical History:  Diagnosis Date  . ADHD (attention deficit hyperactivity disorder)   . Agoraphobia   . Bipolar 1 disorder ([place])    patient denies  . Chronic pain    just migraines per patient  . Endometriosis determined by laparoscopy   . GERD (gastroesophageal reflux disease)   . High platelet count   .  Migraine   . Personality disorder ([place])   . Right adrenal mass ([place])    per ct 09-06-2015  . Right ovarian cyst   . Vaginal Pap smear, abnormal     Past Surgical History:  Procedure Laterality Date  . COLONOSCOPY  2012  . HYSTEROSCOPY W/D&C  10/23/2015   Procedure: DILATATION AND CURETTAGE /HYSTEROSCOPY;  Surgeon: [name], MD;  Location: [facility];  Service: Gynecology;;  . INTRAUTERINE DEVICE (IUD) INSERTION N/A 10/23/2015   Procedure: INTRAUTERINE DEVICE (IUD) INSERTION with dilation of cervix with ultrasound guidance;  Surgeon: [name], MD;  Location: [facility];  Service: Gynecology;  Laterality: N/A;  . LAPAROSCOPIC OVARIAN CYSTECTOMY Right 09/19/2015   Procedure: LAPAROSCOPIC OVARIAN CYSTECTOMY; CAUTERIZATION OF ENDOMETRIAL LESIONS; ENDOMETRIAL CURRETINGS;  Surgeon: [name], MD;  Location: [facility];  Service: Gynecology;  Laterality: Right;    Family History  Problem Relation Age of Onset  . Migraines Mother   . Anxiety disorder Mother   . Diabetes Maternal Grandmother   . Stroke Maternal Grandfather   . Heart attack Paternal Grandfather   . Anxiety disorder Paternal Aunt   . Post-traumatic stress disorder Cousin        After [place]   Social History:  reports that she has been smoking cigarettes. She has a  10.00 pack-year smoking history. She has never used smokeless tobacco. She reports current alcohol use. She reports that she does not use drugs. (Not in a hospital admission)   Allergies:  Allergies  Allergen Reactions  . Sumatriptan Succinate Anaphylaxis and Other (See Comments)    ALL TRIPTAN MEDS  . Triptans Anaphylaxis and Other (See Comments)    Pt states throat feels as if it is closing.  . Compazine [Prochlorperazine] Other (See Comments)    lockjaw  . Ketamine Other (See Comments)    "Pt. Reports she never wants again." major hallucinations  . Reglan [Metoclopramide] Other (See Comments)    "crawling out  of my skin"    Pertinent items noted in HPI and remainder of comprehensive ROS otherwise negative.   General appearance: alert, cooperative, cachectic and no distress Head: Normocephalic, without obvious abnormality, atraumatic Eyes: conjunctivae/corneas clear. PERRL, EOM's intact. Fundi benign. Throat: abnormal findings: Bite marks and laceration on the rigth lateral aspect of the patient's tongue. Neck: no adenopathy, no carotid bruit, no JVD, supple, symmetrical, trachea midline and thyroid not enlarged, symmetric, no tenderness/mass/nodules Resp: No increased work of breathing. No wheezes, rales, or rhonchi. No tactile fremitus Chest wall: no tenderness Cardio: regular rate and rhythm, S1, S2 normal, no murmur, click, rub or gallop GI: soft, non-tender; bowel sounds normal; no masses,  no organomegaly Extremities: extremities normal, atraumatic, no cyanosis or edema Pulses: 2+ and symmetric Skin: Skin color, texture, turgor normal. No rashes or lesions Lymph nodes: Cervical, supraclavicular, and axillary nodes normal. Neurologic: Alert and oriented X 3, normal strength and tone. Normal symmetric reflexes. Normal coordination and gait  Results for orders placed or performed during the hospital encounter of 08/02/18 (from the past 48 hour(s))  CBG monitoring, ED     Status: Abnormal   Collection Time: 08/02/18  2:23 PM  Result Value Ref Range   Glucose-Capillary 141 (H) 70 - 99 mg/dL   Comment 1 Notify RN    Comment 2 Document in Chart   CBC     Status: Abnormal   Collection Time: 08/02/18  2:28 PM  Result Value Ref Range   WBC 20.4 (H) 4.0 - 10.5 K/uL   RBC 4.17 3.87 - 5.11 MIL/uL   Hemoglobin 13.4 12.0 - 15.0 g/dL   HCT 40.1 36.0 - 46.0 %   MCV 96.2 80.0 - 100.0 fL   MCH 32.1 26.0 - 34.0 pg   MCHC 33.4 30.0 - 36.0 g/dL   RDW 12.6 11.5 - 15.5 %   Platelets 421 (H) 150 - 400 K/uL   nRBC 0.0 0.0 - 0.2 %    Comment: Performed at [facility], [address],  [place], [place] [address]  Comprehensive metabolic panel     Status: Abnormal   Collection Time: 08/02/18  2:28 PM  Result Value Ref Range   Sodium 140 135 - 145 mmol/L   Potassium 3.8 3.5 - 5.1 mmol/L   Chloride 109 98 - 111 mmol/L   CO2 13 (L) 22 - 32 mmol/L   Glucose, Bld 156 (H) 70 - 99 mg/dL   BUN 19 6 - 20 mg/dL   Creatinine, Ser 0.92 0.44 - 1.00 mg/dL   Calcium 9.1 8.9 - 10.3 mg/dL   Total Protein 6.7 6.5 - 8.1 g/dL   Albumin 3.9 3.5 - 5.0 g/dL   AST 26 15 - 41 U/L   ALT 15 0 - 44 U/L   Alkaline Phosphatase 44 38 - 126 U/L  Total Bilirubin 0.5 0.3 - 1.2 mg/dL   GFR calc non Af Amer >60 >60 mL/min   GFR calc Af Amer >60 >60 mL/min   Anion gap 18 (H) 5 - 15    Comment: Performed at [facility] [address], [place], [place] [address]  Lactic acid, plasma     Status: Abnormal   Collection Time: 08/02/18  2:28 PM  Result Value Ref Range   Lactic Acid, Venous 4.7 (HH) 0.5 - 1.9 mmol/L    Comment: CRITICAL RESULT CALLED TO, READ BACK BY AND VERIFIED WITH: Nonnie Done RN AT [id number] ON [id number] BY Marcos Eke Performed at [facility], [place] [address], [place], [place] [address]   I-Stat Beta hCG blood, ED ([facility], [facility], [facility] only)     Status: None   Collection Time: 08/02/18  3:02 PM  Result Value Ref Range   I-stat hCG, quantitative <5.0 <5 mIU/mL   Comment 3            Comment:   GEST. AGE      CONC.  (mIU/mL)   <=1 WEEK        5 - 50     2 WEEKS       50 - 500     3 WEEKS       100 - 10,000     4 WEEKS     1,000 - 30,000        FEMALE AND NON-PREGNANT FEMALE:     LESS THAN 5 mIU/mL   APTT     Status: None   Collection Time: 08/02/18  4:12 PM  Result Value Ref Range   aPTT 24 24 - 36 seconds    Comment: Performed at [facility], [place] [address] [place], [place] [address]  Protime-INR     Status: None   Collection Time: 08/02/18  4:12 PM  Result Value Ref Range   Prothrombin Time 13.5 11.4 - 15.2 seconds   INR 1.0 0.8 - 1.2    Comment: (NOTE) INR goal varies  based on device and disease states. Performed at [facility], [place] [address] [place], [place] [address]   Ethanol     Status: None   Collection Time: 08/02/18  4:12 PM  Result Value Ref Range   Alcohol, Ethyl (B) <10 <10 mg/dL    Comment: (NOTE) Lowest detectable limit for serum alcohol is 10 mg/dL. For medical purposes only. Performed at [facility], [place] [address] [place], [place] [address]    @[id number] @  Blood pressure 109/71, pulse 89, temperature 98.8 F (37.1 C), temperature source Rectal, resp. rate 16, height 5\' 4"  (1.626 m), weight 50.8 kg, last menstrual period 07/12/2018, SpO2 100 %.    Assessment/Plan Decreased level of consciousness: Likely due to either post-ictal phase after seizure or due to hypoglycemia.  High anion gap metabolic acidosis: Possibly due to lactic acid. Will recheck. And follow.  Likely seizure: The patient will have seizure precautions and lorazepam should she have a seizure. EEG is pending. CT head is pending. Question illicit use of amphetamines to treat ADHD. Alcohol or withdrawal from it may cause seizures. Drug screen is pending.   Hypoglycemia: Patient states that she doesn't eat much. She states that this is due to her endometriosis, but it is noted that he has a history of agoraphobia, ADHD, and bipolar disorder (which she denies). Question illicit use of adderall or amphetamines to self-treat ADHD vs  anorexia as an adverse reaction of the use of these drugs. I also question her assertion that she does not drink much. Pt will receive D5 at 100 cc/hr to prevent further hypoglycemic events.  I have seen and examined this patient myself. I have spent 76 minutes in her evaluation and admission.  Ayodeji Keimig 08/02/2018, 5:34 PM  ADDENDUM: Examination of the patient's behavioral health record reveals that the patient does indeed take methylphenidate and clonazepam as well as BC powders, zofran, provachol, and ambien.  Methylphenidate may result in seizures and clonazepam may as well if the patient is withdrawing from it. I anticipate consulting psychiatry in the am to help sort this out.

## 2018-08-02 NOTE — ED Notes (Signed)
Patient transported to CT 

## 2018-08-02 NOTE — Sepsis Progress Note (Signed)
Notified bedside nurse of need to draw repeat lactic acid. 

## 2018-08-02 NOTE — Progress Notes (Signed)
Pharmacy Antibiotic Note  Laura Simmons is a 43 y.o. female admitted on 08/02/2018 with sepsis. Pt was found unresponsive and incontinent by family and was brought to ED via EMS. CBG was 45, glucagon and D10 were administered.   Tmax 98.8 w/ WBC 20.4, lactic acid 4.7. Pharmacy has been consulted for cefepime and vancomycin dosing dosing.  Plan: Cefepime 2g IV q8h Vancomycin 1.25g IV x1 then vancomycin 1g IV q24h Monitor renal function and clinical progression F/u C&S  Goal AUC 400-550. Expected AUC: 480.6 SCr used: 0.92   Height: 5\' 4"  (162.6 cm) Weight: 112 lb (50.8 kg) IBW/kg (Calculated) : 54.7  Temp (24hrs), Avg:98.5 F (36.9 C), Min:98.2 F (36.8 C), Max:98.8 F (37.1 C)  Recent Labs  Lab 08/02/18 1428  WBC 20.4*  CREATININE 0.92  LATICACIDVEN 4.7*    Estimated Creatinine Clearance: 63.2 mL/min (by C-G formula based on SCr of 0.92 mg/dL).    Allergies  Allergen Reactions  . Sumatriptan Succinate Anaphylaxis and Other (See Comments)    ALL TRIPTAN MEDS  . Triptans Anaphylaxis and Other (See Comments)    Pt states throat feels as if it is closing.  . Compazine [Prochlorperazine] Other (See Comments)    lockjaw  . Ketamine Other (See Comments)    "Pt. Reports she never wants again." major hallucinations  . Reglan [Metoclopramide] Other (See Comments)    "crawling out of my skin"    Antimicrobials this admission: Cefepime 7/14 >>  Vancomycin 7/14 >>  Metronidazole x1 (7/14)  Dose adjustments this admission: n/a  Microbiology results: Pending  Thank you for allowing pharmacy to be a part of this patient's care.  Natale Lay 08/02/2018 3:47 PM

## 2018-08-02 NOTE — ED Notes (Signed)
Dr. Jeanell Sparrow made aware of pt's Lactic Acid.

## 2018-08-02 NOTE — ED Provider Notes (Signed)
[facility] EMERGENCY DEPARTMENT Provider Note   CSN: [id number] Arrival date & time: 08/02/18  1419     History   Chief Complaint Chief Complaint  Patient presents with  . Hypoglycemia    HPI Laura Simmons is a 43 y.o. female.     HPI  43 year old female brought in via EMS with altered mental status.  Patient lives with her parents.  She was last known normal at 2 AM when she was on the phone with a friend.  Her parents left this morning before she got up.  They returned home this afternoon and found her altered in her bedroom.  EMS arrived to find her with decreased responsiveness.  They report that her initial blood sugar in route was 45.  She received glucagon and D10 and had increased responsiveness.  She reports that she drank about 3 drinks last night did not eat much.  She is unclear whether or not she took Klonopin.  She feels that she has had some low episodes of blood sugar in the past stating that her mother took her blood sugar one time when she felt dizzy and stated that it was low.  She often takes glucose tabs.  However, she has never been evaluated by a physician for this.  Past Medical History:  Diagnosis Date  . ADHD (attention deficit hyperactivity disorder)   . Agoraphobia   . Bipolar 1 disorder ([place])    patient denies  . Chronic pain    just migraines per patient  . Endometriosis determined by laparoscopy   . GERD (gastroesophageal reflux disease)   . High platelet count   . Migraine   . Personality disorder ([place])   . Right adrenal mass ([place])    per ct 09-06-2015  . Right ovarian cyst   . Vaginal Pap smear, abnormal     Patient Active Problem List   Diagnosis Date Noted  . Mood disorder ([place]) 01/13/2018  . PTSD (post-traumatic stress disorder) 10/19/2017  . Chest pain 11/25/2015  . Tachycardia 11/25/2015  . Elevated troponin 11/25/2015  . Anxiety state   . Hyperlipidemia 10/18/2015  . Bipolar disorder with depression ([place])  10/18/2015  . Ovarian cyst 09/19/2015    Class: Present on Admission  . Endometriosis determined by laparoscopy 09/19/2015    Class: Present on Admission  . Chronic daily headache 09/03/2015  . Medication overuse headache 09/03/2015  . Chronic pain disorder 08/19/2015  . Panic disorder 08/19/2015  . IBS (irritable bowel syndrome) 08/19/2015  . Migraine headache 01/06/2013    Past Surgical History:  Procedure Laterality Date  . COLONOSCOPY  2012  . HYSTEROSCOPY W/D&C  10/23/2015   Procedure: DILATATION AND CURETTAGE /HYSTEROSCOPY;  Surgeon: [name], MD;  Location: [facility];  Service: Gynecology;;  . INTRAUTERINE DEVICE (IUD) INSERTION N/A 10/23/2015   Procedure: INTRAUTERINE DEVICE (IUD) INSERTION with dilation of cervix with ultrasound guidance;  Surgeon: [name], MD;  Location: [facility];  Service: Gynecology;  Laterality: N/A;  . LAPAROSCOPIC OVARIAN CYSTECTOMY Right 09/19/2015   Procedure: LAPAROSCOPIC OVARIAN CYSTECTOMY; CAUTERIZATION OF ENDOMETRIAL LESIONS; ENDOMETRIAL CURRETINGS;  Surgeon: [name], MD;  Location: [facility];  Service: Gynecology;  Laterality: Right;     OB History   No obstetric history on file.      Home Medications    Prior to Admission medications   Medication Sig Start Date End Date Taking? Authorizing Provider  Aspirin-Salicylamide-Caffeine (BC HEADACHE POWDER PO) Take by mouth as needed.  [provider]  calcium carbonate (TUMS) 500 MG chewable tablet Chew 1 tablet by mouth as needed for indigestion or heartburn.    [provider]  clonazePAM (KLONOPIN) 1 MG tablet TAKE ONE-HALF TO ONE TABLET BY MOUTH TWICE DAILY AS NEEDED FOR ANXIETY 08/08/18   Thayer Headings, PMHNP  dimenhyDRINATE (DRAMAMINE) 50 MG tablet Take 50 mg by mouth at bedtime as needed for nausea.    [provider]  docusate sodium (COLACE) 100 MG capsule Take 1 capsule (100 mg total) by mouth 2 (two) times  daily. 10/23/15   [name], MD  methylphenidate (RITALIN) 10 MG tablet Take 1 tab two to three times daily. 09/19/18   Thayer Headings, PMHNP  methylphenidate (RITALIN) 10 MG tablet Take 1 tablet two to three times daily 08/22/18   Thayer Headings, PMHNP  methylphenidate (RITALIN) 10 MG tablet Take 1 tablet two to three times daily 07/25/18   Thayer Headings, PMHNP  ondansetron (ZOFRAN) 4 MG tablet Take 1 tablet (4 mg total) by mouth 2 (two) times daily as needed for nausea or vomiting. 07/26/18   Thayer Headings, PMHNP  pravastatin (PRAVACHOL) 40 MG tablet Take 1 tablet (40 mg total) by mouth daily. 03/25/16   Shelda Pal, DO  simethicone (MYLICON) 983 MG chewable tablet Chew 125 mg by mouth every 6 (six) hours as needed for flatulence.    [provider]  zolpidem (AMBIEN) 10 MG tablet Take 1 tablet (10 mg total) by mouth at bedtime as needed for sleep. 05/11/18   Thayer Headings, PMHNP    Family History Family History  Problem Relation Age of Onset  . Migraines Mother   . Anxiety disorder Mother   . Diabetes Maternal Grandmother   . Stroke Maternal Grandfather   . Heart attack Paternal Grandfather   . Anxiety disorder Paternal Aunt   . Post-traumatic stress disorder Cousin        After [place]    Social History Social History   Tobacco Use  . Smoking status: Current Every Day Smoker    Packs/day: 0.50    Years: 20.00    Pack years: 10.00    Types: Cigarettes  . Smokeless tobacco: Never Used  Substance Use Topics  . Alcohol use: Yes    Comment: very rare  . Drug use: No     Allergies   Sumatriptan succinate, Triptans, Compazine [prochlorperazine], Ketamine, and Reglan [metoclopramide]   Review of Systems Review of Systems  All other systems reviewed and are negative.    Physical Exam Updated Vital Signs BP 137/82 (BP Location: Right Arm)   Pulse 91   Temp 98.8 F (37.1 C) (Rectal)   Resp 15   Ht 1.626 m (5\' 4" )   Wt 50.8 kg   LMP  07/12/2018 (Approximate)   SpO2 100%   BMI 19.22 kg/m   Physical Exam Vitals signs and nursing note reviewed.  Constitutional:      General: She is not in acute distress.    Appearance: Normal appearance.  HENT:     Head: Normocephalic.     Right Ear: External ear normal.     Left Ear: External ear normal.     Nose: Nose normal.     Mouth/Throat:     Mouth: Mucous membranes are moist.  Eyes:     Extraocular Movements: Extraocular movements intact.     Pupils: Pupils are equal, round, and reactive to light.  Cardiovascular:     Rate and Rhythm: Normal rate and  regular rhythm.  Pulmonary:     Effort: Pulmonary effort is normal.  Abdominal:     General: Abdomen is flat. Bowel sounds are normal.     Palpations: Abdomen is soft.  Musculoskeletal: Normal range of motion.  Skin:    General: Skin is warm.     Capillary Refill: Capillary refill takes less than 2 seconds.  Neurological:     General: No focal deficit present.     Mental Status: She is alert and oriented to person, place, and time.     Cranial Nerves: No cranial nerve deficit.  Psychiatric:        Mood and Affect: Mood normal.        Behavior: Behavior normal.      ED Treatments / Results  Labs (all labs ordered are listed, but only abnormal results are displayed) Labs Reviewed  CBG MONITORING, ED - Abnormal; Notable for the following components:      Result Value   Glucose-Capillary 141 (*)    All other components within normal limits  CULTURE, BLOOD (ROUTINE X 2)  CULTURE, BLOOD (ROUTINE X 2)  CBC  COMPREHENSIVE METABOLIC PANEL  LACTIC ACID, PLASMA  I-STAT BETA HCG BLOOD, ED ([facility], [facility], [facility] ONLY)    EKG EKG Interpretation  Date/Time:  Tuesday August 02 2018 14:23:53 EDT Ventricular Rate:  98 PR Interval:    QRS Duration: 81 QT Interval:  342 QTC Calculation: 437 R Axis:   71 Text Interpretation:  Sinus rhythm Nonspecific repolarization abnormalities Baseline wander Abnormal ECG Confirmed by  Carmin Muskrat [phone]) on 08/02/2018 3:11:07 PM   Radiology Dg Chest Port 1 View  Result Date: 08/02/2018 CLINICAL DATA:  Hypoglycemia and altered mental status EXAM: PORTABLE CHEST 1 VIEW COMPARISON:  11/24/2015 FINDINGS: The heart size and mediastinal contours are within normal limits. Both lungs are clear. The visualized skeletal structures are unremarkable. IMPRESSION: No acute abnormality noted. Electronically Signed   By: Inez Catalina M.D.   On: 08/02/2018 14:46    Procedures .Critical Care Performed by: Pattricia Boss, MD Authorized by: Pattricia Boss, MD   Critical care provider statement:    Critical care time (minutes):  45   Critical care end time:  08/02/2018 3:45 PM   Critical care was necessary to treat or prevent imminent or life-threatening deterioration of the following conditions:  CNS failure or compromise and sepsis   Critical care was time spent personally by me on the following activities:  Discussions with consultants, evaluation of patient's response to treatment, examination of patient, ordering and performing treatments and interventions, ordering and review of laboratory studies, ordering and review of radiographic studies, pulse oximetry, re-evaluation of patient's condition, obtaining history from patient or surrogate and review of old charts   (including critical care time)  Medications Ordered in ED Medications  sodium chloride 0.9 % bolus 1,000 mL (has no administration in time range)     Initial Impression / Assessment and Plan / ED Course  I have reviewed the triage vital signs and the nursing notes.  Pertinent labs & imaging results that were available during my care of the patient were reviewed by me and considered in my medical decision making (see chart for details).       43 year old female who presents today with altered mental status and hypoglycemia.  On evaluation here her white blood cell count is elevated at 20,000 and lactic acid is 4.9.   Occult infection could explain her hyperglycemia.  She is being given fluid bolus  and broad-spectrum antibiotics as per sepsis guidelines. Discussed with Dr. Vanita Panda and he will reevaluate and arrange for admission after fluid bolus.  Urine pending Final Clinical Impressions(s) / ED Diagnoses   Final diagnoses:  Altered mental status, unspecified altered mental status type  Hypoglycemia  Sepsis with acute organ dysfunction, due to unspecified organism, unspecified type, unspecified whether septic shock present [facility])    ED Discharge Orders    None       Pattricia Boss, MD 08/05/18 1350

## 2018-08-02 NOTE — ED Notes (Signed)
Pt resting quietly at this time with eyes closed.  Pt will arouse to verbal stimuli

## 2018-08-02 NOTE — ED Triage Notes (Signed)
Pt presents from home with [facility]. Family called, found by parents at 2 unresponsive, incontinent and staring to left side. Parents had not spoken to pt since last night. EMS found that CBG was 45 mg/dL on transport, provided glucagon and 150 cc D10 en route. BP 140/80, HR 100bpm. Pt arrives moving spontaneously and responding to questions.

## 2018-08-03 ENCOUNTER — Observation Stay (HOSPITAL_COMMUNITY): Payer: BC Managed Care – PPO

## 2018-08-03 DIAGNOSIS — R221 Localized swelling, mass and lump, neck: Secondary | ICD-10-CM | POA: Diagnosis not present

## 2018-08-03 DIAGNOSIS — Z634 Disappearance and death of family member: Secondary | ICD-10-CM

## 2018-08-03 DIAGNOSIS — R4020 Unspecified coma: Secondary | ICD-10-CM | POA: Diagnosis not present

## 2018-08-03 DIAGNOSIS — M12811 Other specific arthropathies, not elsewhere classified, right shoulder: Secondary | ICD-10-CM | POA: Diagnosis not present

## 2018-08-03 DIAGNOSIS — F3130 Bipolar disorder, current episode depressed, mild or moderate severity, unspecified: Secondary | ICD-10-CM | POA: Diagnosis not present

## 2018-08-03 DIAGNOSIS — K219 Gastro-esophageal reflux disease without esophagitis: Secondary | ICD-10-CM | POA: Diagnosis present

## 2018-08-03 DIAGNOSIS — E162 Hypoglycemia, unspecified: Secondary | ICD-10-CM | POA: Diagnosis not present

## 2018-08-03 DIAGNOSIS — G43909 Migraine, unspecified, not intractable, without status migrainosus: Secondary | ICD-10-CM | POA: Diagnosis present

## 2018-08-03 DIAGNOSIS — E872 Acidosis, unspecified: Secondary | ICD-10-CM | POA: Diagnosis present

## 2018-08-03 DIAGNOSIS — E785 Hyperlipidemia, unspecified: Secondary | ICD-10-CM | POA: Diagnosis present

## 2018-08-03 DIAGNOSIS — N390 Urinary tract infection, site not specified: Secondary | ICD-10-CM | POA: Diagnosis present

## 2018-08-03 DIAGNOSIS — Z833 Family history of diabetes mellitus: Secondary | ICD-10-CM | POA: Diagnosis not present

## 2018-08-03 DIAGNOSIS — F10230 Alcohol dependence with withdrawal, uncomplicated: Secondary | ICD-10-CM | POA: Diagnosis present

## 2018-08-03 DIAGNOSIS — R569 Unspecified convulsions: Secondary | ICD-10-CM | POA: Diagnosis not present

## 2018-08-03 DIAGNOSIS — T424X5A Adverse effect of benzodiazepines, initial encounter: Secondary | ICD-10-CM | POA: Diagnosis present

## 2018-08-03 DIAGNOSIS — Z888 Allergy status to other drugs, medicaments and biological substances status: Secondary | ICD-10-CM | POA: Diagnosis not present

## 2018-08-03 DIAGNOSIS — F17210 Nicotine dependence, cigarettes, uncomplicated: Secondary | ICD-10-CM | POA: Diagnosis present

## 2018-08-03 DIAGNOSIS — N809 Endometriosis, unspecified: Secondary | ICD-10-CM | POA: Diagnosis present

## 2018-08-03 DIAGNOSIS — G894 Chronic pain syndrome: Secondary | ICD-10-CM | POA: Diagnosis not present

## 2018-08-03 DIAGNOSIS — Z884 Allergy status to anesthetic agent status: Secondary | ICD-10-CM | POA: Diagnosis not present

## 2018-08-03 DIAGNOSIS — O99284 Endocrine, nutritional and metabolic diseases complicating childbirth: Secondary | ICD-10-CM | POA: Diagnosis present

## 2018-08-03 DIAGNOSIS — Z818 Family history of other mental and behavioral disorders: Secondary | ICD-10-CM | POA: Diagnosis not present

## 2018-08-03 DIAGNOSIS — F10929 Alcohol use, unspecified with intoxication, unspecified: Secondary | ICD-10-CM | POA: Diagnosis not present

## 2018-08-03 DIAGNOSIS — F319 Bipolar disorder, unspecified: Secondary | ICD-10-CM | POA: Diagnosis present

## 2018-08-03 DIAGNOSIS — F909 Attention-deficit hyperactivity disorder, unspecified type: Secondary | ICD-10-CM | POA: Diagnosis not present

## 2018-08-03 DIAGNOSIS — Z1159 Encounter for screening for other viral diseases: Secondary | ICD-10-CM | POA: Diagnosis not present

## 2018-08-03 DIAGNOSIS — G8929 Other chronic pain: Secondary | ICD-10-CM | POA: Diagnosis present

## 2018-08-03 DIAGNOSIS — Z8249 Family history of ischemic heart disease and other diseases of the circulatory system: Secondary | ICD-10-CM | POA: Diagnosis not present

## 2018-08-03 DIAGNOSIS — F13230 Sedative, hypnotic or anxiolytic dependence with withdrawal, uncomplicated: Secondary | ICD-10-CM | POA: Diagnosis present

## 2018-08-03 DIAGNOSIS — Z823 Family history of stroke: Secondary | ICD-10-CM | POA: Diagnosis not present

## 2018-08-03 DIAGNOSIS — F419 Anxiety disorder, unspecified: Secondary | ICD-10-CM | POA: Diagnosis not present

## 2018-08-03 DIAGNOSIS — F4000 Agoraphobia, unspecified: Secondary | ICD-10-CM | POA: Diagnosis present

## 2018-08-03 DIAGNOSIS — R7881 Bacteremia: Secondary | ICD-10-CM | POA: Diagnosis not present

## 2018-08-03 DIAGNOSIS — R4182 Altered mental status, unspecified: Secondary | ICD-10-CM | POA: Diagnosis present

## 2018-08-03 LAB — COMPREHENSIVE METABOLIC PANEL
ALT: 19 U/L (ref 0–44)
ALT: 19 U/L (ref 0–44)
AST: 27 U/L (ref 15–41)
AST: 24 U/L (ref 15–41)
Albumin: 3 g/dL — ABNORMAL LOW (ref 3.5–5.0)
Albumin: 3 g/dL — ABNORMAL LOW (ref 3.5–5.0)
Alkaline Phosphatase: 35 U/L — ABNORMAL LOW (ref 38–126)
Alkaline Phosphatase: 36 U/L — ABNORMAL LOW (ref 38–126)
Anion gap: 10 (ref 5–15)
Anion gap: 6 (ref 5–15)
BUN: 14 mg/dL (ref 6–20)
BUN: 12 mg/dL (ref 6–20)
CO2: 15 mmol/L — ABNORMAL LOW (ref 22–32)
CO2: 19 mmol/L — ABNORMAL LOW (ref 22–32)
Calcium: 8.1 mg/dL — ABNORMAL LOW (ref 8.9–10.3)
Calcium: 8.5 mg/dL — ABNORMAL LOW (ref 8.9–10.3)
Chloride: 113 mmol/L — ABNORMAL HIGH (ref 98–111)
Chloride: 111 mmol/L (ref 98–111)
Creatinine, Ser: 1 mg/dL (ref 0.44–1.00)
Creatinine, Ser: 0.75 mg/dL (ref 0.44–1.00)
GFR calc Af Amer: >60 mL/min (ref >60)
GFR calc Af Amer: >60 mL/min (ref >60)
GFR calc non Af Amer: >60 mL/min (ref >60)
GFR calc non Af Amer: >60 mL/min (ref >60)
Glucose, Bld: 117 mg/dL — ABNORMAL HIGH (ref 70–99)
Glucose, Bld: 160 mg/dL — ABNORMAL HIGH (ref 70–99)
Potassium: 3.8 mmol/L (ref 3.5–5.1)
Potassium: 3.5 mmol/L (ref 3.5–5.1)
Sodium: 138 mmol/L (ref 135–145)
Sodium: 136 mmol/L (ref 135–145)
Total Bilirubin: 0.7 mg/dL (ref 0.3–1.2)
Total Bilirubin: 0.7 mg/dL (ref 0.3–1.2)
Total Protein: 5.2 g/dL — ABNORMAL LOW (ref 6.5–8.1)
Total Protein: 5.8 g/dL — ABNORMAL LOW (ref 6.5–8.1)

## 2018-08-03 LAB — GLUCOSE, CAPILLARY
Glucose-Capillary: 92 mg/dL (ref 70–99)
Glucose-Capillary: 177 mg/dL — ABNORMAL HIGH (ref 70–99)
Glucose-Capillary: 142 mg/dL — ABNORMAL HIGH (ref 70–99)
Glucose-Capillary: 121 mg/dL — ABNORMAL HIGH (ref 70–99)
Glucose-Capillary: 131 mg/dL — ABNORMAL HIGH (ref 70–99)

## 2018-08-03 LAB — CBC
HCT: 35.7 % — ABNORMAL LOW (ref 36.0–46.0)
Hemoglobin: 11.8 g/dL — ABNORMAL LOW (ref 12.0–15.0)
MCH: 31.9 pg (ref 26.0–34.0)
MCHC: 33.1 g/dL (ref 30.0–36.0)
MCV: 96.5 fL (ref 80.0–100.0)
Platelets: 376 10*3/uL (ref 150–400)
RBC: 3.7 MIL/uL — ABNORMAL LOW (ref 3.87–5.11)
RDW: 12.8 % (ref 11.5–15.5)
WBC: 13.6 10*3/uL — ABNORMAL HIGH (ref 4.0–10.5)
nRBC: 0 % (ref 0.0–0.2)

## 2018-08-03 LAB — HIV ANTIBODY (ROUTINE TESTING W REFLEX): HIV Screen 4th Generation wRfx: NONREACTIVE

## 2018-08-03 MED ORDER — HYDROCODONE-ACETAMINOPHEN 5-325 MG PO TABS
1.0000 | ORAL_TABLET | Freq: Four times a day (QID) | ORAL | Status: DC | PRN
  Administered 2018-08-03 – 2018-08-05 (×6): 1 via ORAL
  Filled 2018-08-03 (×6): qty 1

## 2018-08-03 MED ORDER — TRAMADOL HCL 50 MG PO TABS
50.0000 mg | ORAL_TABLET | Freq: Four times a day (QID) | ORAL | Status: DC | PRN
  Administered 2018-08-03: 50 mg via ORAL
  Filled 2018-08-03: qty 1

## 2018-08-03 MED ORDER — SODIUM CHLORIDE 0.9 % IV SOLN
INTRAVENOUS | Status: DC | PRN
  Administered 2018-08-03 – 2018-08-05 (×3): 250 mL via INTRAVENOUS
  Administered 2018-08-05: 21:00:00 via INTRAVENOUS
  Administered 2018-08-05 – 2018-08-06 (×3): 250 mL via INTRAVENOUS

## 2018-08-03 MED ORDER — ONDANSETRON HCL 4 MG/2ML IJ SOLN
4.0000 mg | INTRAMUSCULAR | Status: AC
  Administered 2018-08-03 (×3): 4 mg via INTRAVENOUS
  Filled 2018-08-03 (×3): qty 2

## 2018-08-03 NOTE — Progress Notes (Signed)
EEG complete - results pending 

## 2018-08-03 NOTE — Procedures (Signed)
ELECTROENCEPHALOGRAM REPORT   Patient: Laura Simmons       Room #: 2K02R EEG No. ID: [id number] Age: 43 y.o.        Sex: female Referring Physician: Eliseo Squires Report Date:  08/03/2018        Interpreting Physician: Alexis Goodell  History: Lazette Estala is an 43 y.o. female with seizure-like activity  Medications:  None  Conditions of Recording:  This is a 21 channel routine scalp EEG performed with bipolar and monopolar montages arranged in accordance to the international 10/20 system of electrode placement. One channel was dedicated to EKG recording.  The patient is in the awake, drowsy and asleep states.  Description:  The waking background activity consists of a low voltage, symmetrical, fairly well organized, 10 Hz alpha activity, seen from the parieto-occipital and posterior temporal regions.  Low voltage fast activity, poorly organized, is seen anteriorly and is at times superimposed on more posterior regions.  A mixture of theta and alpha rhythms are seen from the central and temporal regions. The patient drowses with slowing to irregular, low voltage theta and beta activity.   The patient goes in to a light sleep with symmetrical sleep spindles, vertex central sharp transients and irregular slow activity.  No epileptiform activity is noted.   Hyperventilation was not performed.  Intermittent photic stimulation was performed but failed to illicit any change in the tracing.    IMPRESSION: Normal electroencephalogram, awake, asleep and with activation procedures. There are no focal lateralizing or epileptiform features.   Alexis Goodell, MD Neurology [phone] 08/03/2018, 11:40 AM

## 2018-08-03 NOTE — Consult Note (Signed)
Telepsych Consultation   Reason for Consult:  "depression due to recent loss of fiance and dog" Referring Physician:  Dr. Eulogio Bear Location of Patient: [facility] Location of Provider: [facility]  Patient Identification: Laura Simmons MRN:  [id number] Principal Diagnosis: Anxiety Diagnosis:  Principal Problem:   Hypoglycemia Active Problems:   Chronic pain disorder   Bipolar disorder with depression (HCC)   Mood disorder (HCC)   Metabolic acidosis   Seizures (HCC)   Total Time spent with patient: 1 hour  Subjective:   Laura Simmons is a 43 y.o. female patient admitted with seizure.  HPI:   Per chart review, patient was admitted with seizure. She has a history of illicit use of amphetamines to treat ADHD. Primary team is concerned that use of amphetamines and alcohol use/withdrawal may have lowered her seizure threshold. Psychiatry is consulted for depression in the setting of loss of her pets and fiance. Home medications include Ritalin 10 mg TID, Ambien 10 mg qhs and Klonopin 1 mg BID. BAL and UDS were negative on admission.   On interview, Laura Simmons reports that her mood has been relatively stable although she had intermittent anxiety. She reports good effect with Klonopin. She reports various medication side effects or ineffectiveness of trials with SSRIs and SNRIs in the past. She lives at home with her elderly parents. She runs errands for them. She reports that she lost her fiance in 2016 to suicide. She recently lost two dogs over the past few months. She has a therapist and psychiatrist. She had a phone session with her therapist in April. She saw her psychiatrist a month ago. She denies SI, HI or AVH. She denies a history of suicide attempts.    Past Psychiatric History: Depression, anxiety, ADD, bipolar disorder and polysubstance abuse (amphetamines and alcohol).    Risk to Self:  None. Denies SI.  Risk to Others:  None. Denies HI.  Prior Inpatient  Therapy:  Denies  Prior Outpatient Therapy:  She is followed by Thayer Headings, PMHNP. Prior medications include Zyprexa (worsened depression), Trileptal, Ritalin, Seroquel (RLS), Depakote, Trazodone (excessive daytime somnolence), Klonopin and Ambien.   Past Medical History:  Past Medical History:  Diagnosis Date  . ADHD (attention deficit hyperactivity disorder)   . Agoraphobia   . Bipolar 1 disorder ([place])    patient denies  . Chronic pain    just migraines per patient  . Endometriosis determined by laparoscopy   . GERD (gastroesophageal reflux disease)   . High platelet count   . Migraine   . Personality disorder ([place])   . Right adrenal mass ([place])    per ct 09-06-2015  . Right ovarian cyst   . Vaginal Pap smear, abnormal     Past Surgical History:  Procedure Laterality Date  . COLONOSCOPY  2012  . HYSTEROSCOPY W/D&C  10/23/2015   Procedure: DILATATION AND CURETTAGE /HYSTEROSCOPY;  Surgeon: [name], MD;  Location: [facility];  Service: Gynecology;;  . INTRAUTERINE DEVICE (IUD) INSERTION N/A 10/23/2015   Procedure: INTRAUTERINE DEVICE (IUD) INSERTION with dilation of cervix with ultrasound guidance;  Surgeon: [name], MD;  Location: [facility];  Service: Gynecology;  Laterality: N/A;  . LAPAROSCOPIC OVARIAN CYSTECTOMY Right 09/19/2015   Procedure: LAPAROSCOPIC OVARIAN CYSTECTOMY; CAUTERIZATION OF ENDOMETRIAL LESIONS; ENDOMETRIAL CURRETINGS;  Surgeon: [name], MD;  Location: [facility];  Service: Gynecology;  Laterality: Right;   Family History:  Family History  Problem Relation Age of Onset  . Migraines Mother   .  Anxiety disorder Mother   . Diabetes Maternal Grandmother   . Stroke Maternal Grandfather   . Heart attack Paternal Grandfather   . Anxiety disorder Paternal Aunt   . Post-traumatic stress disorder Cousin        After [place]   Family Psychiatric  History: As listed above.  Social History:  Social History    Substance and Sexual Activity  Alcohol Use Yes   Comment: very rare     Social History   Substance and Sexual Activity  Drug Use No    Social History   Socioeconomic History  . Marital status: Single    Spouse name: Not on file  . Number of children: Not on file  . Years of education: Not on file  . Highest education level: Not on file  Occupational History  . Not on file  Social Needs  . Financial resource strain: Not on file  . Food insecurity    Worry: Not on file    Inability: Not on file  . Transportation needs    Medical: Not on file    Non-medical: Not on file  Tobacco Use  . Smoking status: Current Every Day Smoker    Packs/day: 0.50    Years: 20.00    Pack years: 10.00    Types: Cigarettes  . Smokeless tobacco: Never Used  Substance and Sexual Activity  . Alcohol use: Yes    Comment: very rare  . Drug use: No  . Sexual activity: Not Currently    Birth control/protection: None  Lifestyle  . Physical activity    Days per week: Not on file    Minutes per session: Not on file  . Stress: Not on file  Relationships  . Social Herbalist on phone: Not on file    Gets together: Not on file    Attends religious service: Not on file    Active member of club or organization: Not on file    Attends meetings of clubs or organizations: Not on file    Relationship status: Not on file  Other Topics Concern  . Not on file  Social History Narrative   Lives with parents in a one story home.  No children.     Currently not working.  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    Education: college   Additional Social History: She lives at home with her elderly parents. She is single. She does not have children. She is unemployed. She reports social alcohol use. She denies illicit substance use.     Allergies:   Allergies  Allergen Reactions  . Prochlorperazine Other (See Comments)    lockjaw Other reaction(s): Other  . Sumatriptan  Succinate Anaphylaxis and Other (See Comments)    ALL TRIPTAN MEDS  . Triptans Anaphylaxis and Other (See Comments)    Pt states throat feels as if it is closing.  Marland Kitchen Ketamine Other (See Comments)    "Pt. Reports she never wants again." major hallucinations  . Reglan [Metoclopramide] Other (See Comments)    "crawling out of my skin"    Labs:  Results for orders placed or performed during the hospital encounter of 08/02/18 (from the past 48 hour(s))  CBG monitoring, ED     Status: Abnormal   Collection Time: 08/02/18  2:23 PM  Result Value Ref Range   Glucose-Capillary 141 (H) 70 - 99 mg/dL   Comment 1 Notify RN    Comment 2 Document in  Chart   CBC     Status: Abnormal   Collection Time: 08/02/18  2:28 PM  Result Value Ref Range   WBC 20.4 (H) 4.0 - 10.5 K/uL   RBC 4.17 3.87 - 5.11 MIL/uL   Hemoglobin 13.4 12.0 - 15.0 g/dL   HCT 40.1 36.0 - 46.0 %   MCV 96.2 80.0 - 100.0 fL   MCH 32.1 26.0 - 34.0 pg   MCHC 33.4 30.0 - 36.0 g/dL   RDW 12.6 11.5 - 15.5 %   Platelets 421 (H) 150 - 400 K/uL   nRBC 0.0 0.0 - 0.2 %    Comment: Performed at [facility], [place] [address] [place], [place] [address]  Comprehensive metabolic panel     Status: Abnormal   Collection Time: 08/02/18  2:28 PM  Result Value Ref Range   Sodium 140 135 - 145 mmol/L   Potassium 3.8 3.5 - 5.1 mmol/L   Chloride 109 98 - 111 mmol/L   CO2 13 (L) 22 - 32 mmol/L   Glucose, Bld 156 (H) 70 - 99 mg/dL   BUN 19 6 - 20 mg/dL   Creatinine, Ser 0.92 0.44 - 1.00 mg/dL   Calcium 9.1 8.9 - 10.3 mg/dL   Total Protein 6.7 6.5 - 8.1 g/dL   Albumin 3.9 3.5 - 5.0 g/dL   AST 26 15 - 41 U/L   ALT 15 0 - 44 U/L   Alkaline Phosphatase 44 38 - 126 U/L   Total Bilirubin 0.5 0.3 - 1.2 mg/dL   GFR calc non Af Amer >60 >60 mL/min   GFR calc Af Amer >60 >60 mL/min   Anion gap 18 (H) 5 - 15    Comment: Performed at [facility] [address] [place], [place] [address]  Lactic acid, plasma     Status: Abnormal    Collection Time: 08/02/18  2:28 PM  Result Value Ref Range   Lactic Acid, Venous 4.7 (HH) 0.5 - 1.9 mmol/L    Comment: CRITICAL RESULT CALLED TO, READ BACK BY AND VERIFIED WITH: Nonnie Done RN AT [phone] ON [id number] BY Marcos Eke Performed at [facility], [place] [address], [place], [place] [address]   Gamma GT     Status: None   Collection Time: 08/02/18  2:54 PM  Result Value Ref Range   GGT 16 7 - 50 U/L    Comment: Performed at [facility], [place] [address] [place],  [address]  I-Stat Beta hCG blood, ED ([facility], [facility], [facility] only)     Status: None   Collection Time: 08/02/18  3:02 PM  Result Value Ref Range   I-stat hCG, quantitative <5.0 <5 mIU/mL   Comment 3            Comment:   GEST. AGE      CONC.  (mIU/mL)   <=1 WEEK        5 - 50     2 WEEKS       50 - 500     3 WEEKS       100 - 10,000     4 WEEKS     1,000 - 30,000        FEMALE AND NON-PREGNANT FEMALE:     LESS THAN 5 mIU/mL   APTT     Status: None   Collection Time: 08/02/18  4:12 PM  Result Value Ref Range   aPTT 24 24 - 36 seconds  Comment: Performed at [facility], [place] [address], [place], [place] [address]  Protime-INR     Status: None   Collection Time: 08/02/18  4:12 PM  Result Value Ref Range   Prothrombin Time 13.5 11.4 - 15.2 seconds   INR 1.0 0.8 - 1.2    Comment: (NOTE) INR goal varies based on device and disease states. Performed at [facility], [place] [address] [place], [place] [address]   Ethanol     Status: None   Collection Time: 08/02/18  4:12 PM  Result Value Ref Range   Alcohol, Ethyl (B) <10 <10 mg/dL    Comment: (NOTE) Lowest detectable limit for serum alcohol is 10 mg/dL. For medical purposes only. Performed at [facility], [place] [address], [place], [place] [address]   Lactic acid, plasma     Status: None   Collection Time: 08/02/18  5:11 PM  Result Value Ref Range   Lactic Acid, Venous 1.5 0.5 - 1.9 mmol/L    Comment: Performed at [facility] [address], [place], [place] [address]  HIV Antibody (routine testing w rflx)     Status: None   Collection Time: 08/02/18  5:15 PM  Result Value Ref Range   HIV Screen 4th Generation wRfx Non Reactive Non Reactive    Comment: (NOTE) Performed At: [facility] [place], [place] [id number] [place] MD QB:[id number]   SARS Coronavirus 2 ([place] - Performed in [place] hospital lab), Hosp Order     Status: None   Collection Time: 08/02/18  6:23 PM   Specimen: Nasopharyngeal Swab  Result Value Ref Range   SARS Coronavirus 2 NEGATIVE NEGATIVE    Comment: (NOTE) If result is NEGATIVE SARS-CoV-2 target nucleic acids are NOT DETECTED. The SARS-CoV-2 RNA is generally detectable in upper and lower  respiratory specimens during the acute phase of infection. The lowest  concentration of SARS-CoV-2 viral copies this assay can detect is 250  copies / mL. A negative result does not preclude SARS-CoV-2 infection  and should not be used as the sole basis for treatment or other  patient management decisions.  A negative result may occur with  improper specimen collection / handling, submission of specimen other  than nasopharyngeal swab, presence of viral mutation(s) within the  areas targeted by this assay, and inadequate number of viral copies  (<250 copies / mL). A negative result must be combined with clinical  observations, patient history, and epidemiological information. If result is POSITIVE SARS-CoV-2 target nucleic acids are DETECTED. The SARS-CoV-2 RNA is generally detectable in upper and lower  respiratory specimens dur ing the acute phase of infection.  Positive  results are indicative of active infection with SARS-CoV-2.  Clinical  correlation with patient history and other diagnostic information is  necessary to determine patient infection status.  Positive results do  not rule out bacterial infection or co-infection with other viruses. If result is  PRESUMPTIVE POSTIVE SARS-CoV-2 nucleic acids MAY BE PRESENT.   A presumptive positive result was obtained on the submitted specimen  and confirmed on repeat testing.  While 2019 novel coronavirus  (SARS-CoV-2) nucleic acids may be present in the submitted sample  additional confirmatory testing may be necessary for epidemiological  and / or clinical management purposes  to differentiate between  SARS-CoV-2 and other Sarbecovirus currently known to infect humans.  If clinically indicated additional testing with an alternate test  methodology [phone]) is advised. The SARS-CoV-2  RNA is generally  detectable in upper and lower respiratory sp ecimens during the acute  phase of infection. The expected result is Negative. Fact Sheet for Patients:  StrictlyIdeas.no Fact Sheet for Healthcare Providers: BankingDealers.co.za This test is not yet approved or cleared by the [place] FDA and has been authorized for detection and/or diagnosis of SARS-CoV-2 by FDA under an Emergency Use Authorization (EUA).  This EUA will remain in effect (meaning this test can be used) for the duration of the COVID-19 declaration under Section 564(b)(1) of the Act, 21 U.S.C. section 360bbb-3(b)(1), unless the authorization is terminated or revoked sooner. Performed at [facility], [place] [address] [place], [place] [address]   Urinalysis, Routine w reflex microscopic     Status: Abnormal   Collection Time: 08/02/18  9:11 PM  Result Value Ref Range   Color, Urine YELLOW YELLOW   APPearance HAZY (A) CLEAR   Specific Gravity, Urine 1.014 1.005 - 1.030   pH 5.0 5.0 - 8.0   Glucose, UA NEGATIVE NEGATIVE mg/dL   Hgb urine dipstick NEGATIVE NEGATIVE   Bilirubin Urine NEGATIVE NEGATIVE   Ketones, ur 20 (A) NEGATIVE mg/dL   Protein, ur NEGATIVE NEGATIVE mg/dL   Nitrite POSITIVE (A) NEGATIVE   Leukocytes,Ua NEGATIVE NEGATIVE   RBC / HPF 0-5 0 - 5 RBC/hpf    WBC, UA 0-5 0 - 5 WBC/hpf   Bacteria, UA RARE (A) NONE SEEN   Squamous Epithelial / LPF 0-5 0 - 5    Comment: Performed at [facility], [address], [place], [place] [address]  Rapid urine drug screen (hospital performed)     Status: None   Collection Time: 08/02/18  9:11 PM  Result Value Ref Range   Opiates NONE DETECTED NONE DETECTED   Cocaine NONE DETECTED NONE DETECTED   Benzodiazepines NONE DETECTED NONE DETECTED   Amphetamines NONE DETECTED NONE DETECTED   Tetrahydrocannabinol NONE DETECTED NONE DETECTED   Barbiturates NONE DETECTED NONE DETECTED    Comment: (NOTE) DRUG SCREEN FOR MEDICAL PURPOSES ONLY.  IF CONFIRMATION IS NEEDED FOR ANY PURPOSE, NOTIFY LAB WITHIN 5 DAYS. LOWEST DETECTABLE LIMITS FOR URINE DRUG SCREEN Drug Class                     Cutoff (ng/mL) Amphetamine and metabolites    1000 Barbiturate and metabolites    200 Benzodiazepine                 829 Tricyclics and metabolites     300 Opiates and metabolites        300 Cocaine and metabolites        300 THC                            50 Performed at [facility], [place] [address], [place], [place] [address]   Lactic acid, plasma     Status: None   Collection Time: 08/02/18  9:12 PM  Result Value Ref Range   Lactic Acid, Venous 1.9 0.5 - 1.9 mmol/L    Comment: Performed at [facility] [address], [place], [place] [address]  Glucose, capillary     Status: None   Collection Time: 08/02/18  9:52 PM  Result Value Ref Range   Glucose-Capillary 78 70 - 99 mg/dL   Comment 1 Notify RN    Comment 2 Document in Chart   Comprehensive metabolic panel  Status: Abnormal   Collection Time: 08/03/18  3:23 AM  Result Value Ref Range   Sodium 138 135 - 145 mmol/L   Potassium 3.8 3.5 - 5.1 mmol/L   Chloride 113 (H) 98 - 111 mmol/L   CO2 15 (L) 22 - 32 mmol/L   Glucose, Bld 117 (H) 70 - 99 mg/dL   BUN 14 6 - 20 mg/dL   Creatinine, Ser 1.00 0.44 - 1.00 mg/dL   Calcium 8.1 (L) 8.9 -  10.3 mg/dL   Total Protein 5.2 (L) 6.5 - 8.1 g/dL   Albumin 3.0 (L) 3.5 - 5.0 g/dL   AST 27 15 - 41 U/L   ALT 19 0 - 44 U/L   Alkaline Phosphatase 35 (L) 38 - 126 U/L   Total Bilirubin 0.7 0.3 - 1.2 mg/dL   GFR calc non Af Amer >60 >60 mL/min   GFR calc Af Amer >60 >60 mL/min   Anion gap 10 5 - 15    Comment: Performed at [facility], [place] [address] [place], [place] [address]  CBC     Status: Abnormal   Collection Time: 08/03/18  3:23 AM  Result Value Ref Range   WBC 13.6 (H) 4.0 - 10.5 K/uL   RBC 3.70 (L) 3.87 - 5.11 MIL/uL   Hemoglobin 11.8 (L) 12.0 - 15.0 g/dL   HCT 35.7 (L) 36.0 - 46.0 %   MCV 96.5 80.0 - 100.0 fL   MCH 31.9 26.0 - 34.0 pg   MCHC 33.1 30.0 - 36.0 g/dL   RDW 12.8 11.5 - 15.5 %   Platelets 376 150 - 400 K/uL   nRBC 0.0 0.0 - 0.2 %    Comment: Performed at [facility], [place] [address], [place], [place] [address]  Glucose, capillary     Status: None   Collection Time: 08/03/18  6:17 AM  Result Value Ref Range   Glucose-Capillary 92 70 - 99 mg/dL  Glucose, capillary     Status: Abnormal   Collection Time: 08/03/18  9:08 AM  Result Value Ref Range   Glucose-Capillary 177 (H) 70 - 99 mg/dL   Comment 1 Notify RN    Comment 2 Document in Chart   Glucose, capillary     Status: Abnormal   Collection Time: 08/03/18 11:43 AM  Result Value Ref Range   Glucose-Capillary 142 (H) 70 - 99 mg/dL   Comment 1 Notify RN    Comment 2 Document in Chart     Medications:  Current Facility-Administered Medications  Medication Dose Route Frequency Provider Last Rate Last Dose  . 0.9 %  sodium chloride infusion   Intravenous PRN Swayze, Ava, DO 10 mL/hr at 08/03/18 0031 250 mL at 08/03/18 0031  . dextrose 5 %-0.45 % sodium chloride infusion   Intravenous Continuous Radene Gunning, NP 75 mL/hr at 08/03/18 0853    . enoxaparin (LOVENOX) injection 40 mg  40 mg Subcutaneous Q24H Swayze, Ava, DO   40 mg at 08/02/18 2137  . HYDROcodone-acetaminophen (NORCO/VICODIN)  5-325 MG per tablet 1 tablet  1 tablet Oral Q6H PRN Eulogio Bear U, DO      . LORazepam (ATIVAN) injection 2 mg  2 mg Intravenous Q5 min PRN Swayze, Ava, DO      . LORazepam (ATIVAN) tablet 0.5 mg  0.5 mg Oral Q4H PRN Swayze, Ava, DO      . ondansetron (ZOFRAN) injection 4 mg  4 mg Intravenous Q4H Black, Lezlie Octave, NP  4 mg at 08/03/18 2440    Musculoskeletal: Strength & Muscle Tone: No atrophy noted. Gait & Station: UTA since patient is lying in bed. Patient leans: N/A  Psychiatric Specialty Exam: Physical Exam  Nursing note and vitals reviewed. Constitutional: She is oriented to person, place, and time. She appears well-developed and well-nourished.  HENT:  Head: Normocephalic and atraumatic.  Neck: Normal range of motion.  Respiratory: Effort normal.  Musculoskeletal: Normal range of motion.  Neurological: She is alert and oriented to person, place, and time.  Psychiatric: She has a normal mood and affect. Her speech is normal and behavior is normal. Judgment and thought content normal. Cognition and memory are normal.    Review of Systems  Cardiovascular: Negative for chest pain.  Gastrointestinal: Negative for abdominal pain.  Psychiatric/Behavioral: Negative for depression, hallucinations, substance abuse and suicidal ideas. The patient is nervous/anxious.   All other systems reviewed and are negative.   Blood pressure 106/69, pulse 79, temperature 99.1 F (37.3 C), temperature source Oral, resp. rate 20, height 5\' 4"  (1.626 m), weight 50.6 kg, last menstrual period 07/12/2018, SpO2 99 %.Body mass index is 19.14 kg/m.  General Appearance: Fairly Groomed, middle aged, Caucasian female, wearing a hospital gown with a face mask who is lying in bed. NAD.   Eye Contact:  Good  Speech:  Clear and Coherent and Normal Rate  Volume:  Normal  Mood:  "Okay but anxious at times."  Affect:  Appropriate and Congruent  Thought Process:  Goal Directed, Linear and Descriptions of  Associations: Intact  Orientation:  Full (Time, Place, and Person)  Thought Content:  Logical  Suicidal Thoughts:  No  Homicidal Thoughts:  No  Memory:  Immediate;   Good Recent;   Good Remote;   Good  Judgement:  Fair  Insight:  Fair  Psychomotor Activity:  Normal  Concentration:  Concentration: Good and Attention Span: Good  Recall:  Good  Fund of Knowledge:  Good  Language:  Good  Akathisia:  No  Handed:  Right  AIMS (if indicated):   N/A  Assets:  Communication Skills Desire for Improvement Housing Physical Health Resilience Social Support  ADL's:  Intact  Cognition:  WNL  Sleep:   N/A   Assessment:  Laura Simmons is a 43 y.o. female who was admitted with seizure. Psychiatry was consulted for depression. Patient reports that her mood has been relatively stable although endorses intermittent anxiety at times. She reports good effect with Klonopin. She denies SI, HI or AVH. She declines medication adjustments at this time.   Treatment Plan Summary: -Patient declines medication management at this time and reports that her mood has been relatively stable.  -EKG reviewed and QTc 451 on 7/14. Please closely monitor when starting or increasing QTc prolonging agents.  -Patient should continue to follow up with her outpatient psychiatrist and therapist.  -Psychiatry will sign off on patient at this time. Please consult psychiatry again as needed.    Disposition: No evidence of imminent risk to self or others at present.   Patient does not meet criteria for psychiatric inpatient admission.  This service was provided via telemedicine using a 2-way, interactive audio and video technology.  Names of all persons participating in this telemedicine service and their role in this encounter. Name: Buford Dresser, DO Role: Psychiatrist  Name: Laura Simmons  Role: Patient    Faythe Dingwall, DO 08/03/2018 12:08 PM

## 2018-08-03 NOTE — Progress Notes (Addendum)
Patient in session with Dr. Mariea Clonts [facility] 6

## 2018-08-03 NOTE — Progress Notes (Signed)
[facility] PROGRESS NOTE  Maxwell Martorano RUE:[id number] DOB: 06-27-1975 DOA: 08/02/2018 PCP: Shelda Pal, DO  Assessment/Plan:  1. Seizure. Patient with incontinence and tongue biting. May be related to illicit use of amphetamines to treat ADHD in setting of ETOH use/withdrawal and/or home meds that lower threshold. Drug screen negative, CT head negative, EEG results pending. Of note, home meds include Methylphenidate and clonazepam as well as zofran, ambien and provachol.  -seizure precautions -ativan as needed  -follow eeg  2. Hypoglycemia: patient reports decreased oral intake due to chronic pain, ongoing depression and nausea. Currently getting D5NS.  -decrease IV rate as cbg trending up and po intake improving -monitor cbg every 4 hours -scheduled zofran for ongoing nausea  3. Metabolic acidosis. On presentation lactic acid 4.7 likely related to dehydration in setting of seizure. In ED sepsis protocol initiated specifically IV fluids and antibiotics. She is afebrile and non-toxic appearing. No infectious process suspected. CO2 is trending up from 13 to 15 this am, anion gap from 18 to 10 this am -continue IV fluids at slower rate -stop antibiotics -cmet this afternoon  4. Bipolar disorder with depression. Patient admits to feeling quite depressed at passing of pets and fiance. Also complains chronic pain in shoulder that is "due for surgery Monday". Reports Thayer Headings is "psychiatrist". Chart review indicates recent office visit 6/22 [facility] consult -counseled patient regarding meds   Code Status: full Family Communication: patient Disposition Plan: home hopefully tomorrow   Consultants:  BH  Procedures:    Antibiotics:  vanc and cefepime 1 day  HPI/Subjective: Lying in bed covers over head. Arouses to verbal stimuli. Complains nausea and right shoulder pain and depression.   Objective: Vitals:   08/03/18 0023 08/03/18 0507  BP: 100/62 105/69   Pulse: 92 78  Resp: 18 18  Temp: 99.5 F (37.5 C) 98.9 F (37.2 C)  SpO2: 99% 100%    Intake/Output Summary (Last 24 hours) at 08/03/2018 1115 Last data filed at 08/03/2018 0900 Gross per 24 hour  Intake 2354.32 ml  Output 1000 ml  Net 1354.32 ml   Filed Weights   08/02/18 1427 08/02/18 2104 08/03/18 0507  Weight: 50.8 kg 50.1 kg 50.6 kg    Exam:   General:  Awake alert no acute distress  Cardiovascular: rrr no mgr no LE edema  Respiratory: normal effort BS clear bilaterally no wheeze  Abdomen: non-distended non-tender +BS   Musculoskeletal: joints without swelling/erythema  ENT: oropharynx without exudate, left side of tongue with wound/bite   Data Reviewed: Basic Metabolic Panel: Recent Labs  Lab 08/02/18 1428 08/03/18 0323  NA 140 138  K 3.8 3.8  CL 109 113*  CO2 13* 15*  GLUCOSE 156* 117*  BUN 19 14  CREATININE 0.92 1.00  CALCIUM 9.1 8.1*   Liver Function Tests: Recent Labs  Lab 08/02/18 1428 08/03/18 0323  AST 26 27  ALT 15 19  ALKPHOS 44 35*  BILITOT 0.5 0.7  PROT 6.7 5.2*  ALBUMIN 3.9 3.0*   No results for input(s): LIPASE, AMYLASE in the last 168 hours. No results for input(s): AMMONIA in the last 168 hours. CBC: Recent Labs  Lab 08/02/18 1428 08/03/18 0323  WBC 20.4* 13.6*  HGB 13.4 11.8*  HCT 40.1 35.7*  MCV 96.2 96.5  PLT 421* 376   Cardiac Enzymes: No results for input(s): CKTOTAL, CKMB, CKMBINDEX, TROPONINI in the last 168 hours. BNP (last 3 results) No results for input(s): BNP in the last 8760 hours.  ProBNP (last  3 results) No results for input(s): PROBNP in the last 8760 hours.  CBG: Recent Labs  Lab 08/02/18 1423 08/02/18 2152 08/03/18 0617 08/03/18 0908  GLUCAP 141* 78 92 177*    Recent Results (from the past 240 hour(s))  SARS Coronavirus 2 (CEPHEID - Performed in [place] hospital lab), Hosp Order     Status: None   Collection Time: 08/02/18  6:23 PM   Specimen: Nasopharyngeal Swab  Result Value  Ref Range Status   SARS Coronavirus 2 NEGATIVE NEGATIVE Final    Comment: (NOTE) If result is NEGATIVE SARS-CoV-2 target nucleic acids are NOT DETECTED. The SARS-CoV-2 RNA is generally detectable in upper and lower  respiratory specimens during the acute phase of infection. The lowest  concentration of SARS-CoV-2 viral copies this assay can detect is 250  copies / mL. A negative result does not preclude SARS-CoV-2 infection  and should not be used as the sole basis for treatment or other  patient management decisions.  A negative result may occur with  improper specimen collection / handling, submission of specimen other  than nasopharyngeal swab, presence of viral mutation(s) within the  areas targeted by this assay, and inadequate number of viral copies  (<250 copies / mL). A negative result must be combined with clinical  observations, patient history, and epidemiological information. If result is POSITIVE SARS-CoV-2 target nucleic acids are DETECTED. The SARS-CoV-2 RNA is generally detectable in upper and lower  respiratory specimens dur ing the acute phase of infection.  Positive  results are indicative of active infection with SARS-CoV-2.  Clinical  correlation with patient history and other diagnostic information is  necessary to determine patient infection status.  Positive results do  not rule out bacterial infection or co-infection with other viruses. If result is PRESUMPTIVE POSTIVE SARS-CoV-2 nucleic acids MAY BE PRESENT.   A presumptive positive result was obtained on the submitted specimen  and confirmed on repeat testing.  While 2019 novel coronavirus  (SARS-CoV-2) nucleic acids may be present in the submitted sample  additional confirmatory testing may be necessary for epidemiological  and / or clinical management purposes  to differentiate between  SARS-CoV-2 and other Sarbecovirus currently known to infect humans.  If clinically indicated additional testing with an  alternate test  methodology [phone]) is advised. The SARS-CoV-2 RNA is generally  detectable in upper and lower respiratory sp ecimens during the acute  phase of infection. The expected result is Negative. Fact Sheet for Patients:  StrictlyIdeas.no Fact Sheet for Healthcare Providers: BankingDealers.co.za This test is not yet approved or cleared by the [place] FDA and has been authorized for detection and/or diagnosis of SARS-CoV-2 by FDA under an Emergency Use Authorization (EUA).  This EUA will remain in effect (meaning this test can be used) for the duration of the COVID-19 declaration under Section 564(b)(1) of the Act, 21 U.S.C. section 360bbb-3(b)(1), unless the authorization is terminated or revoked sooner. Performed at [facility], [place] [address], [place], [place] [address]      Studies: Ct Head Wo Contrast  Result Date: 08/02/2018 CLINICAL DATA:  43 year old female found at 1330 hours unresponsive. EXAM: CT HEAD WITHOUT CONTRAST TECHNIQUE: Contiguous axial images were obtained from the base of the skull through the vertex without intravenous contrast. COMPARISON:  Report of head CT 07/02/2000 (no images available). FINDINGS: Brain: Normal cerebral volume. No midline shift, ventriculomegaly, mass effect, evidence of mass lesion, intracranial hemorrhage or evidence of cortically based acute infarction. Gray-white matter differentiation is within  normal limits throughout the brain. Vascular: No suspicious intracranial vascular hyperdensity. Skull: Negative. Sinuses/Orbits: Visualized paranasal sinuses and mastoids are clear. i Other: Visualized orbits and scalp soft tissues are within normal limits. Trace retained secretions in the nasopharynx. IMPRESSION: Normal noncontrast CT appearance of the brain. Electronically Signed   By: Genevie Ann M.D.   On: 08/02/2018 19:17   Dg Chest Port 1 View  Result Date: 08/02/2018 CLINICAL  DATA:  Hypoglycemia and altered mental status EXAM: PORTABLE CHEST 1 VIEW COMPARISON:  11/24/2015 FINDINGS: The heart size and mediastinal contours are within normal limits. Both lungs are clear. The visualized skeletal structures are unremarkable. IMPRESSION: No acute abnormality noted. Electronically Signed   By: Inez Catalina M.D.   On: 08/02/2018 14:46    Scheduled Meds: . enoxaparin (LOVENOX) injection  40 mg Subcutaneous Q24H  . ondansetron (ZOFRAN) IV  4 mg Intravenous Q4H   Continuous Infusions: . sodium chloride 250 mL (08/03/18 0031)  . ceFEPime (MAXIPIME) IV 2 g (08/03/18 0032)  . dextrose 5 % and 0.45% NaCl 75 mL/hr at 08/03/18 0853  . vancomycin      Principal Problem:   Hypoglycemia Active Problems:   Chronic pain disorder   Bipolar disorder with depression (HCC)   Metabolic acidosis   Mood disorder (HCC)   Seizures ([place])    Time spent: 54 minutes    [place] NP  [facility]  If 7PM-7AM, please contact night-coverage at [email or web address], password [facility] 08/03/2018, 11:15 AM  LOS: 0 days

## 2018-08-04 ENCOUNTER — Inpatient Hospital Stay (HOSPITAL_COMMUNITY): Payer: BC Managed Care – PPO

## 2018-08-04 LAB — BLOOD CULTURE ID PANEL (REFLEXED)

## 2018-08-04 LAB — URINE DRUGS OF ABUSE SCREEN W ALC, ROUTINE (REF LAB)
Amphetamines, Urine: NEGATIVE ng/mL
Barbiturate, Ur: NEGATIVE ng/mL
Benzodiazepine Quant, Ur: NEGATIVE ng/mL
Cannabinoid Quant, Ur: NEGATIVE ng/mL
Cocaine (Metab.): NEGATIVE ng/mL
Ethanol U, Quan: NEGATIVE %
Methadone Screen, Urine: NEGATIVE ng/mL
Opiate Quant, Ur: NEGATIVE ng/mL
Phencyclidine, Ur: NEGATIVE ng/mL
Propoxyphene, Urine: NEGATIVE ng/mL

## 2018-08-04 LAB — BASIC METABOLIC PANEL
Anion gap: 5 (ref 5–15)
BUN: <5 mg/dL — ABNORMAL LOW (ref 6–20)
CO2: 24 mmol/L (ref 22–32)
Calcium: 8.6 mg/dL — ABNORMAL LOW (ref 8.9–10.3)
Chloride: 112 mmol/L — ABNORMAL HIGH (ref 98–111)
Creatinine, Ser: 0.73 mg/dL (ref 0.44–1.00)
GFR calc Af Amer: >60 mL/min (ref >60)
GFR calc non Af Amer: >60 mL/min (ref >60)
Glucose, Bld: 84 mg/dL (ref 70–99)
Potassium: 3.7 mmol/L (ref 3.5–5.1)
Sodium: 141 mmol/L (ref 135–145)

## 2018-08-04 LAB — GLUCOSE, CAPILLARY
Glucose-Capillary: 106 mg/dL — ABNORMAL HIGH (ref 70–99)
Glucose-Capillary: 121 mg/dL — ABNORMAL HIGH (ref 70–99)
Glucose-Capillary: 78 mg/dL (ref 70–99)
Glucose-Capillary: 79 mg/dL (ref 70–99)
Glucose-Capillary: 87 mg/dL (ref 70–99)
Glucose-Capillary: 99 mg/dL (ref 70–99)

## 2018-08-04 MED ORDER — THIAMINE HCL 100 MG/ML IJ SOLN
100.0000 mg | Freq: Every day | INTRAMUSCULAR | Status: DC
  Filled 2018-08-04 (×2): qty 2

## 2018-08-04 MED ORDER — LORAZEPAM 1 MG PO TABS: 1.0000 mg | ORAL_TABLET | Freq: Four times a day (QID) | ORAL | Status: DC | PRN

## 2018-08-04 MED ORDER — VITAMIN B-1 100 MG PO TABS
100.0000 mg | ORAL_TABLET | Freq: Every day | ORAL | Status: DC
  Administered 2018-08-04 – 2018-08-08 (×5): 100 mg via ORAL
  Filled 2018-08-04 (×5): qty 1

## 2018-08-04 MED ORDER — SODIUM CHLORIDE 0.9 % IV SOLN
1.0000 g | Freq: Three times a day (TID) | INTRAVENOUS | Status: DC
  Filled 2018-08-04 (×2): qty 1

## 2018-08-04 MED ORDER — ADULT MULTIVITAMIN W/MINERALS CH
1.0000 | ORAL_TABLET | Freq: Every day | ORAL | Status: DC
  Administered 2018-08-04 – 2018-08-08 (×5): 1 via ORAL
  Filled 2018-08-04 (×5): qty 1

## 2018-08-04 MED ORDER — LORAZEPAM 2 MG/ML IJ SOLN: 1.0000 mg | Freq: Four times a day (QID) | INTRAMUSCULAR | Status: AC | PRN

## 2018-08-04 MED ORDER — GADOBUTROL 1 MMOL/ML IV SOLN
5.0000 mL | Freq: Once | INTRAVENOUS | Status: AC | PRN
  Administered 2018-08-04: 5 mL via INTRAVENOUS

## 2018-08-04 MED ORDER — SODIUM CHLORIDE 0.9 % IV SOLN
2.0000 g | Freq: Three times a day (TID) | INTRAVENOUS | Status: DC
  Administered 2018-08-04 – 2018-08-07 (×8): 2 g via INTRAVENOUS
  Filled 2018-08-04 (×10): qty 2

## 2018-08-04 MED ORDER — FOLIC ACID 1 MG PO TABS
1.0000 mg | ORAL_TABLET | Freq: Every day | ORAL | Status: DC
  Administered 2018-08-04 – 2018-08-08 (×5): 1 mg via ORAL
  Filled 2018-08-04 (×5): qty 1

## 2018-08-04 MED ORDER — CLONAZEPAM 0.5 MG PO TABS
0.5000 mg | ORAL_TABLET | Freq: Two times a day (BID) | ORAL | Status: DC | PRN
  Administered 2018-08-04 – 2018-08-07 (×4): 0.5 mg via ORAL
  Filled 2018-08-04 (×4): qty 1

## 2018-08-04 NOTE — Consult Note (Signed)
Neurology Consultation  Reason for Consult: Multiple seizures Referring Physician: HAll  CC: Concern for seizures  History is obtained from: Patient  HPI: Laura Simmons is a 43 y.o. female with past medical history of migraine, high platelet count, chronic pain, bipolar disorder, agoraphobia, and ADHD.  Patient was admitted on 08/02/2018 after patient was found down at home with a presumed seizure..  Patient states that the night before she did have a couple drinks.  She then remembers being on the floor with her family member suffering overnight, she wanted to talk but could not bring her words out or move any parts of her body.  No shaking. She reports that in the past she has had for 5 episodes of lightheadedness with nearly blacking out when her blood sugars have been low but never had this kind of an episode that he had yesterday. EMS arrived, noted her glucose to be 45, given D50, which increased her CBG to 141 and she started talking again. She denies history of seizures.  Denies history of febrile seizures as child.  Normal birth.  No head trauma.  Denies illicit drugs She is on Klonopin chronically for anxiety.  Reports her anxiety to be around baseline for her which is higher than a normal person's anxiety level according to her.  She is concerned about her right shoulder torn ligaments and rotator cuff injury for which she has a surgery scheduled and which will debilitate her for a few weeks to months. She moved to [place] 3 years ago from [place].  Her boyfriend had committed suicide there and her life suddenly change.  Her 2 dogs died last year.  She has been under a lot of stress and sadness from that. She also takes tramadol and Ritalin and after multiple questionings, said that she might of taken some extra Ritalin and tramadol.  She has a history of chronic migraines.  She was on tramadol and a bunch of other medications that she cannot remember.  She had seen Dr. Posey Pronto at  [facility] with neurology at some point but did not follow-up afterwards that she was told to follow-up with a neurologist that has since retired.  She is not on any prophylactic medications for migraine anymore.  According to Dr. Serita Grit note, she has tried Depakote, propranolol, topiramate, verapamil, zonisamide, Zoloft, Celexa, Lexapro, Cymbalta, gabapentin, Seroquel, triptans and tizanidine but not tried Botox or could not remember if she has tried nortriptyline or amitriptyline.  She has tried sphenopalatine block without relief. She has never had complex migraine-like symptoms.  I did call the mother's name is [name] who wanted to make sure if we can get her on the cell phone please call her home phone which is [phone].  She states that when she found her daughter it was noted that her eyes open and staring.  Patient was moaning and she was stiff.  The last time that she was actually known normal in talking to her friend was 2 AM.  She was actually found at 10 AM that day.  She had urinary incontinence at that time.  Mother also states that she was also abusing her tramadol  Mother states that she did drink the night before but is unclear how much.  Work up that has been done thus far: [employer], drug screen negative, urinalysis positive for nitrites but rare bacteria, urine culture negative -EEG- this was a normal EEG -MRI brain- normal examination.  No abnormality seen to explain the clinical presentation -CT head-  normal noncontrast CT  ROS: ROS was performed and is negative except as noted in the HPI.   Past Medical History:  Diagnosis Date  . ADHD (attention deficit hyperactivity disorder)   . Agoraphobia   . Bipolar 1 disorder ([place])    patient denies  . Chronic pain    just migraines per patient  . Endometriosis determined by laparoscopy   . GERD (gastroesophageal reflux disease)   . High platelet count   . Migraine   . Personality disorder ([place])   . Right adrenal mass  ([place])    per ct 09-06-2015  . Right ovarian cyst   . Vaginal Pap smear, abnormal      Family History  Problem Relation Age of Onset  . Migraines Mother   . Anxiety disorder Mother   . Diabetes Maternal Grandmother   . Stroke Maternal Grandfather   . Heart attack Paternal Grandfather   . Anxiety disorder Paternal Aunt   . Post-traumatic stress disorder Cousin        After [place]   Social History:   reports that she has been smoking cigarettes. She has a 10.00 pack-year smoking history. She has never used smokeless tobacco. She reports current alcohol use. She reports that she does not use drugs.  Medications  Current Facility-Administered Medications:  .  0.9 %  sodium chloride infusion, , Intravenous, PRN, Swayze, Ava, DO, Last Rate: 10 mL/hr at 08/03/18 0031, 250 mL at 08/03/18 0031 .  clonazePAM (KLONOPIN) tablet 0.5 mg, 0.5 mg, Oral, BID PRN, Irene Pap N, DO .  enoxaparin (LOVENOX) injection 40 mg, 40 mg, Subcutaneous, Q24H, Swayze, Ava, DO, 40 mg at 08/03/18 2108 .  folic acid (FOLVITE) tablet 1 mg, 1 mg, Oral, Daily, Hall, Matheny N, DO .  HYDROcodone-acetaminophen (NORCO/VICODIN) 5-325 MG per tablet 1 tablet, 1 tablet, Oral, Q6H PRN, Geradine Girt, DO, 1 tablet at 08/04/18 0755 .  [DISCONTINUED] LORazepam (ATIVAN) tablet 1 mg, 1 mg, Oral, Q6H PRN **OR** LORazepam (ATIVAN) injection 1 mg, 1 mg, Intravenous, Q6H PRN, Hall, Carole N, DO .  LORazepam (ATIVAN) injection 2 mg, 2 mg, Intravenous, Q5 min PRN, Swayze, Ava, DO .  multivitamin with minerals tablet 1 tablet, 1 tablet, Oral, Daily, Hall, Carole N, DO .  thiamine (VITAMIN B-1) tablet 100 mg, 100 mg, Oral, Daily **OR** thiamine (B-1) injection 100 mg, 100 mg, Intravenous, Daily, Kayleen Memos, DO  Exam: Current vital signs: BP 136/83 (BP Location: Right Arm)   Pulse 73   Temp 98.6 F (37 C) (Oral)   Resp 18   Ht 5\' 4"  (1.626 m)   Wt 50.8 kg Comment: scale a  LMP 07/12/2018 (Approximate)   SpO2 100%   BMI  19.22 kg/m  Vital signs in last 24 hours: Temp:  [98.4 F (36.9 C)-98.8 F (37.1 C)] 98.6 F (37 C) (07/16 1215) Pulse Rate:  [73-78] 73 (07/16 1215) Resp:  [18] 18 (07/16 1215) BP: (107-136)/(74-83) 136/83 (07/16 1215) SpO2:  [98 %-100 %] 100 % (07/16 1215) Weight:  [50.8 kg] 50.8 kg (07/16 0403)  Physical Exam  Constitutional: Appears well-developed and well-nourished.  Psych: Affect appropriate to situation Eyes: No scleral injection HENT: No OP obstrucion Head: Normocephalic.  Cardiovascular: Normal rate and regular rhythm.  Respiratory: Effort normal, non-labored breathing GI: Soft.  No distension. There is no tenderness.  Skin: WDI  Neuro: Mental Status: Patient is awake, alert, oriented to person, place, month, year, and situation. Patient is able to give a clear and coherent  history. No signs of aphasia or neglect Cranial Nerves: II: Visual Fields are full.  III,IV, VI: EOMI without ptosis or diploplia. Pupils equal, round and reactive to light V: Facial sensation is symmetric to temperature VII: Facial movement is symmetric.  VIII: hearing is intact to voice X: Palat elevates symmetrically XI: Shoulder shrug is symmetric. XII: tongue is midline without atrophy or fasciculations.-  Bitten tongue noted on the right side Motor: Tone is normal. Bulk is normal. 5/5 strength was present in all four extremities.  She does have a right RTC tear to it she is supposed to have surgery on the 22nd of this month that she cannot abduct her right shoulder Sensory: Sensation is symmetric to light touch and temperature in the arms and legs. Deep Tendon Reflexes: 2+ and symmetric in the biceps and patellae.  Plantars: Toes are downgoing bilaterally.  Cerebellar: FNF and HKS are intact bilaterally  Labs I have reviewed labs in epic and the results pertinent to this consultation are:   CBC    Component Value Date/Time   WBC 13.6 (H) 08/03/2018 0323   RBC 3.70 (L)  08/03/2018 0323   HGB 11.8 (L) 08/03/2018 0323   HCT 35.7 (L) 08/03/2018 0323   PLT 376 08/03/2018 0323   MCV 96.5 08/03/2018 0323   MCH 31.9 08/03/2018 0323   MCHC 33.1 08/03/2018 0323   RDW 12.8 08/03/2018 0323   LYMPHSABS 2.9 11/24/2015 1830   MONOABS 1.0 11/24/2015 1830   EOSABS 0.8 (H) 11/24/2015 1830   BASOSABS 0.0 11/24/2015 1830    CMP     Component Value Date/Time   NA 141 08/04/2018 0419   K 3.7 08/04/2018 0419   CL 112 (H) 08/04/2018 0419   CO2 24 08/04/2018 0419   GLUCOSE 84 08/04/2018 0419   BUN <5 (L) 08/04/2018 0419   CREATININE 0.73 08/04/2018 0419   CALCIUM 8.6 (L) 08/04/2018 0419   PROT 5.8 (L) 08/03/2018 1144   ALBUMIN 3.0 (L) 08/03/2018 1144   AST 24 08/03/2018 1144   ALT 19 08/03/2018 1144   ALKPHOS 36 (L) 08/03/2018 1144   BILITOT 0.7 08/03/2018 1144   GFRNONAA >60 08/04/2018 0419   GFRAA >60 08/04/2018 0419   Imaging I have reviewed the images obtained: As above  Etta Quill PA-C [facility] [phone]  M-F  (9:00 am- 5:00 PM)  08/04/2018, 12:46 PM   Assessment:  43 year old woman with a past medical history of uncontrolled migraines, chronic pain, bipolar disorder, agoraphobia, ADHD with possible overuse of tramadol and Ritalin, presenting with an episode concerning for seizure. She does have tongue bite on her exam and had urinary continence.  She does remember the episode which is rather atypical but not impossible but there was a large gap of time when she was last seen in when she was found down on the ground which probably means that she might have been on the ground for unknown period of time without being seen and probably would have ceased at that time. She also has a extensive history of chronic headaches, having failed multiple medications Also reports extreme stressors, with no suicidal or homicidal ideation.  I do believe that she probably had a seizure this time, but it was probably provoked by hypoglycemia and  Ritalin/tramadol overuse. Her prior episodes of lightheadedness were nothing like this episode and I do not believe she has had multiple seizure episodes.  Due to the provoked nature of the seizure, and first-time seizure, I would not recommend starting any  treatment.  Impression: Likely provoked seizure in the setting of hypoglycemia as well as  Recommendations: -No AEDs due to first time seizure which is likely provoked. Start AED is seizure recurs -Seizure precautions -Minimize or d/c Tramadol and Ritalin as both can lower seizure threshold. -Follow up with psychiatrist/counselor -Management of episodic hypoglycemia per primary team. This will also possibly help with not provoking seizure. -Headache management - refer to Dr. Jaynee Eagles at [facility] as outpatient.   Attending Neurohospitalist Addendum Patient seen and examined with APP/Resident. Agree with the history and physical as documented above. Agree with the plan as documented, which I helped formulate. I have independently reviewed the chart, obtained history, review of systems and examined the patient.I have personally reviewed pertinent head/neck/spine imaging (CT/MRI). Please feel free to call with any questions. --- Amie Portland, MD [facility] Pager: [phone]  If 7pm to 7am, please call on call as listed on AMION.

## 2018-08-04 NOTE — Progress Notes (Addendum)
PROGRESS NOTE  Laura Simmons CXK:[id number] DOB: January 21, 1975 DOA: 08/02/2018 PCP: Shelda Pal, DO  HPI/Recap of past 24 hours: The patient is a 43 yr old woman who lives at home with her parents and does not work. She states that she has a past medical history of endometriosis. She states that she has not seen a doctor in some time. She denies any previous medical history. The patient states that she has a "couple" of drinks last night, but she did not eat much. She states that she awoke, but felt that she couldn't get up out of bed. She states that she thinks that she "had a stroke or seizure, or something". This has happened 4-5 times in the past. She states that this has happened before. She states that she frequently does not eat well due to her endometriosis. She states that she has seen a doctor for this in the past, but that she hasn't seen anyone for a while. She is not on any medications. She denies heavy drinking or illicit drug use.   The patient was brought to the ED by EMS. Her parents called them when the patient was found unresponsive, incontinent and staring to the left side. She was found to have a CBG of 45. She responded to glucagon and a50 cc D10 and was awake and moving spontaneously upon arrival.   In the ED the patient was found to have glucose of 141, lactic acid of 4.7, WBC of 20.4, and anion gap of 18. Ethyl alcohol was less than 10. Urine drug screen and CT head are pending. CXR showed no acute abnormality.  [facility] were called to admit the patient. She will be admitted to a telemetry bed. CT head is pending. She will receiv D5 at 100 cc/hour. EEG has been ordered. Repat lactic acid has been ordered.  08/04/18: Patient was seen and examined at her bedside.  She reports no prior history of seizures or known family history of seizures.  She consumes liquor 2-3 times per month 3-4 drinks.  When patient was found minimally responsive on the floor she  had bitten her tongue and had urinary incontinence.  EEG showed no evidence of seizure activity.  At home she is on Ritalin and tramadol, they are being held.  Will consult neurology to further assess.   Assessment/Plan: Principal Problem:   Anxiety Active Problems:   Chronic pain disorder   Bipolar disorder with depression (HCC)   Mood disorder (HCC)   Hypoglycemia   Metabolic acidosis   Seizures ([place])  Suspected seizure Patient was found minimally responsive with a bitten tongue and urinary incontinence. On Ritalin and tramadol with history of alcohol use with concern for withdrawal No prior personal or family history of seizures Continue to hold off tramadol and Ritalin as taken decreased seizure threshold EEG done on 08/03/2018 unrevealing CT head unremarkable for any acute intracranial findings MRI ordered and pending Neurology consulted, Dr. Rory Percy, will see in consultation.  Highly appreciated. Start CIWA protocol IV Ativan as needed for seizures  Leukocytosis WBC 13 K from 20 K on presentation Suspect reactive from possible seizures No sign of active infective process  Afebrile, chest x-ray and UA are negative.  Alcohol use disorder with concern for withdrawal States she consumes liquor ~3 times a month 3-4 drinks Start CIWA protocol  ADHD On Ritalin Hold Ritalin  Chronic right shoulder pain from rotator cuff injury Patient reports planned surgery on Monday outpt Optimize pain control  General anxiety/depression  Psych was consulted and patient declined medication management by psych stating that her mood has been relatively stable Resume Klonopin  Code Status: full Family Communication:  Will call if okay with the patient. Disposition Plan:  Home when neurology signs off.  DVT prophylaxis: Subcu Lovenox daily  Consultants: Neurology.   Objective: Vitals:   08/03/18 0507 08/03/18 1147 08/03/18 2008 08/04/18 0403  BP: 105/69 106/69 107/74 133/80   Pulse: 78 79 78 73  Resp: 18 20 18 18   Temp: 98.9 F (37.2 C) 99.1 F (37.3 C) 98.8 F (37.1 C) 98.4 F (36.9 C)  TempSrc: Oral Oral Oral Oral  SpO2: 100% 99% 99% 98%  Weight: 50.6 kg   50.8 kg  Height:        Intake/Output Summary (Last 24 hours) at 08/04/2018 8295 Last data filed at 08/04/2018 0408 Gross per 24 hour  Intake 1409.03 ml  Output 1300 ml  Net 109.03 ml   Filed Weights   08/02/18 2104 08/03/18 0507 08/04/18 0403  Weight: 50.1 kg 50.6 kg 50.8 kg    Exam:  . General: 43 y.o. year-old female well developed well nourished in no acute distress.  Alert and oriented x3. . Cardiovascular: Regular rate and rhythm with no rubs or gallops.  No thyromegaly or JVD noted.   Marland Kitchen Respiratory: Clear to auscultation with no wheezes or rales. Good inspiratory effort. . Abdomen: Soft nontender nondistended with normal bowel sounds x4 quadrants.   . Musculoskeletal: No lower extremity edema. 2/4 pulses in all 4 extremities.Right rotator cuff tender on palpation. Marland Kitchen Psychiatry: Mood is appropriate for condition and setting   Data Reviewed: CBC: Recent Labs  Lab 08/02/18 1428 08/03/18 0323  WBC 20.4* 13.6*  HGB 13.4 11.8*  HCT 40.1 35.7*  MCV 96.2 96.5  PLT 421* 621   Basic Metabolic Panel: Recent Labs  Lab 08/02/18 1428 08/03/18 0323 08/03/18 1144 08/04/18 0419  NA 140 138 136 141  K 3.8 3.8 3.5 3.7  CL 109 113* 111 112*  CO2 13* 15* 19* 24  GLUCOSE 156* 117* 160* 84  BUN 19 14 12  <5*  CREATININE 0.92 1.00 0.75 0.73  CALCIUM 9.1 8.1* 8.5* 8.6*   GFR: Estimated Creatinine Clearance: 72.7 mL/min (by C-G formula based on SCr of 0.73 mg/dL). Liver Function Tests: Recent Labs  Lab 08/02/18 1428 08/03/18 0323 08/03/18 1144  AST 26 27 24   ALT 15 19 19   ALKPHOS 44 35* 36*  BILITOT 0.5 0.7 0.7  PROT 6.7 5.2* 5.8*  ALBUMIN 3.9 3.0* 3.0*   No results for input(s): LIPASE, AMYLASE in the last 168 hours. No results for input(s): AMMONIA in the last 168 hours.  Coagulation Profile: Recent Labs  Lab 08/02/18 1612  INR 1.0   Cardiac Enzymes: No results for input(s): CKTOTAL, CKMB, CKMBINDEX, TROPONINI in the last 168 hours. BNP (last 3 results) No results for input(s): PROBNP in the last 8760 hours. HbA1C: No results for input(s): HGBA1C in the last 72 hours. CBG: Recent Labs  Lab 08/03/18 1628 08/03/18 2005 08/04/18 0001 08/04/18 0401 08/04/18 0758  GLUCAP 121* 131* 121* 79 99   Lipid Profile: No results for input(s): CHOL, HDL, LDLCALC, TRIG, CHOLHDL, LDLDIRECT in the last 72 hours. Thyroid Function Tests: No results for input(s): TSH, T4TOTAL, FREET4, T3FREE, THYROIDAB in the last 72 hours. Anemia Panel: No results for input(s): VITAMINB12, FOLATE, FERRITIN, TIBC, IRON, RETICCTPCT in the last 72 hours. Urine analysis:    Component Value Date/Time   COLORURINE YELLOW 08/02/2018 2111  APPEARANCEUR HAZY (A) 08/02/2018 2111   LABSPEC 1.014 08/02/2018 2111   PHURINE 5.0 08/02/2018 2111   GLUCOSEU NEGATIVE 08/02/2018 2111   HGBUR NEGATIVE 08/02/2018 2111   BILIRUBINUR NEGATIVE 08/02/2018 2111   BILIRUBINUR n 10/07/2015 1010   KETONESUR 20 (A) 08/02/2018 2111   PROTEINUR NEGATIVE 08/02/2018 2111   UROBILINOGEN 0.2 10/07/2015 1010   UROBILINOGEN 1.0 01/19/2013 1501   NITRITE POSITIVE (A) 08/02/2018 2111   LEUKOCYTESUR NEGATIVE 08/02/2018 2111   Sepsis Labs: @LABRCNTIP (procalcitonin:4,lacticidven:4)  ) Recent Results (from the past 240 hour(s))  Blood culture (routine x 2)     Status: None (Preliminary result)   Collection Time: 08/02/18  2:54 PM   Specimen: BLOOD  Result Value Ref Range Status   Specimen Description BLOOD RIGHT ANTECUBITAL  Final   Special Requests   Final    BOTTLES DRAWN AEROBIC AND ANAEROBIC Blood Culture adequate volume   Culture   Final    NO GROWTH < 24 HOURS Performed at [facility], [place] [address] [place], [place] [address]    Report Status PENDING  Incomplete  Blood culture (routine  x 2)     Status: None (Preliminary result)   Collection Time: 08/02/18  2:55 PM   Specimen: BLOOD RIGHT HAND  Result Value Ref Range Status   Specimen Description BLOOD RIGHT HAND  Final   Special Requests   Final    BOTTLES DRAWN AEROBIC AND ANAEROBIC Blood Culture results may not be optimal due to an inadequate volume of blood received in culture bottles   Culture   Final    NO GROWTH < 24 HOURS Performed at [facility], [place] [address], [place],  [address]    Report Status PENDING  Incomplete  SARS Coronavirus 2 (CEPHEID - Performed in [place] hospital lab), Hosp Order     Status: None   Collection Time: 08/02/18  6:23 PM   Specimen: Nasopharyngeal Swab  Result Value Ref Range Status   SARS Coronavirus 2 NEGATIVE NEGATIVE Final    Comment: (NOTE) If result is NEGATIVE SARS-CoV-2 target nucleic acids are NOT DETECTED. The SARS-CoV-2 RNA is generally detectable in upper and lower  respiratory specimens during the acute phase of infection. The lowest  concentration of SARS-CoV-2 viral copies this assay can detect is 250  copies / mL. A negative result does not preclude SARS-CoV-2 infection  and should not be used as the sole basis for treatment or other  patient management decisions.  A negative result may occur with  improper specimen collection / handling, submission of specimen other  than nasopharyngeal swab, presence of viral mutation(s) within the  areas targeted by this assay, and inadequate number of viral copies  (<250 copies / mL). A negative result must be combined with clinical  observations, patient history, and epidemiological information. If result is POSITIVE SARS-CoV-2 target nucleic acids are DETECTED. The SARS-CoV-2 RNA is generally detectable in upper and lower  respiratory specimens dur ing the acute phase of infection.  Positive  results are indicative of active infection with SARS-CoV-2.  Clinical  correlation with patient history and other  diagnostic information is  necessary to determine patient infection status.  Positive results do  not rule out bacterial infection or co-infection with other viruses. If result is PRESUMPTIVE POSTIVE SARS-CoV-2 nucleic acids MAY BE PRESENT.   A presumptive positive result was obtained on the submitted specimen  and confirmed on repeat testing.  While 2019 novel coronavirus  (SARS-CoV-2) nucleic acids may  be present in the submitted sample  additional confirmatory testing may be necessary for epidemiological  and / or clinical management purposes  to differentiate between  SARS-CoV-2 and other Sarbecovirus currently known to infect humans.  If clinically indicated additional testing with an alternate test  methodology [phone]) is advised. The SARS-CoV-2 RNA is generally  detectable in upper and lower respiratory sp ecimens during the acute  phase of infection. The expected result is Negative. Fact Sheet for Patients:  StrictlyIdeas.no Fact Sheet for Healthcare Providers: BankingDealers.co.za This test is not yet approved or cleared by the [place] FDA and has been authorized for detection and/or diagnosis of SARS-CoV-2 by FDA under an Emergency Use Authorization (EUA).  This EUA will remain in effect (meaning this test can be used) for the duration of the COVID-19 declaration under Section 564(b)(1) of the Act, 21 U.S.C. section 360bbb-3(b)(1), unless the authorization is terminated or revoked sooner. Performed at [facility], [place] [address] [place], [place] [address]   Urine culture     Status: None (Preliminary result)   Collection Time: 08/03/18  3:15 AM   Specimen: In/Out Cath Urine  Result Value Ref Range Status   Specimen Description IN/OUT CATH URINE  Final   Special Requests NONE  Final   Culture   Final    CULTURE REINCUBATED FOR BETTER GROWTH Performed at [facility], [place] [address] [place], [place]  [address]    Report Status PENDING  Incomplete      Studies: No results found.  Scheduled Meds: . enoxaparin (LOVENOX) injection  40 mg Subcutaneous Q24H    Continuous Infusions: . sodium chloride 250 mL (08/03/18 0031)     LOS: 1 day     Kayleen Memos, MD [facility] Pager [phone]  If 7PM-7AM, please contact night-coverage [email or web address] Password [email or web address] 08/04/2018, 8:21 AM

## 2018-08-04 NOTE — Plan of Care (Signed)
Patient has tremors and with Right shoulder pain PRNs given

## 2018-08-04 NOTE — Progress Notes (Addendum)
PHARMACY - PHYSICIAN COMMUNICATION CRITICAL VALUE ALERT - BLOOD CULTURE IDENTIFICATION (BCID)  Laura Simmons is an 43 y.o. female who presented to [facility] on 08/02/2018   Assessment: Presented with suspected seizure, found to have leukocytosis, 1 of 4 bottles with [place]  Name of physician (or Provider) Contacted: [place] Crane  Current antibiotics: None  Changes to prescribed antibiotics recommended:  Add/Restart Cefepime 2 gram iv q 8 hours until cultures result  Results for orders placed or performed during the hospital encounter of 08/02/18  Blood Culture ID Panel (Reflexed) (Collected: 08/02/2018  2:54 PM)  Result Value Ref Range   Enterococcus species NOT DETECTED NOT DETECTED   Listeria monocytogenes NOT DETECTED NOT DETECTED   Staphylococcus species NOT DETECTED NOT DETECTED   Staphylococcus aureus (BCID) NOT DETECTED NOT DETECTED   Streptococcus species NOT DETECTED NOT DETECTED   Streptococcus agalactiae NOT DETECTED NOT DETECTED   Streptococcus pneumoniae NOT DETECTED NOT DETECTED   Streptococcus pyogenes NOT DETECTED NOT DETECTED   Acinetobacter baumannii NOT DETECTED NOT DETECTED   Enterobacteriaceae species NOT DETECTED NOT DETECTED   Enterobacter cloacae complex NOT DETECTED NOT DETECTED   Escherichia coli NOT DETECTED NOT DETECTED   Klebsiella oxytoca NOT DETECTED NOT DETECTED   Klebsiella pneumoniae NOT DETECTED NOT DETECTED   Proteus species NOT DETECTED NOT DETECTED   Serratia marcescens NOT DETECTED NOT DETECTED   Haemophilus influenzae NOT DETECTED NOT DETECTED   Neisseria meningitidis NOT DETECTED NOT DETECTED   Pseudomonas aeruginosa NOT DETECTED NOT DETECTED   Candida albicans NOT DETECTED NOT DETECTED   Candida glabrata NOT DETECTED NOT DETECTED   Candida krusei NOT DETECTED NOT DETECTED   Candida parapsilosis NOT DETECTED NOT DETECTED   Candida tropicalis NOT DETECTED NOT DETECTED    Tad Moore 08/04/2018  6:32 PM

## 2018-08-05 LAB — BASIC METABOLIC PANEL
Anion gap: 5 (ref 5–15)
BUN: 5 mg/dL — ABNORMAL LOW (ref 6–20)
CO2: 24 mmol/L (ref 22–32)
Calcium: 8.5 mg/dL — ABNORMAL LOW (ref 8.9–10.3)
Chloride: 109 mmol/L (ref 98–111)
Creatinine, Ser: 0.65 mg/dL (ref 0.44–1.00)
GFR calc Af Amer: >60 mL/min (ref >60)
GFR calc non Af Amer: >60 mL/min (ref >60)
Glucose, Bld: 87 mg/dL (ref 70–99)
Potassium: 3.5 mmol/L (ref 3.5–5.1)
Sodium: 138 mmol/L (ref 135–145)

## 2018-08-05 LAB — GLUCOSE, CAPILLARY
Glucose-Capillary: 100 mg/dL — ABNORMAL HIGH (ref 70–99)
Glucose-Capillary: 104 mg/dL — ABNORMAL HIGH (ref 70–99)
Glucose-Capillary: 111 mg/dL — ABNORMAL HIGH (ref 70–99)
Glucose-Capillary: 72 mg/dL (ref 70–99)
Glucose-Capillary: 80 mg/dL (ref 70–99)

## 2018-08-05 LAB — CBC WITH DIFFERENTIAL/PLATELET
Abs Immature Granulocytes: 0.02 10*3/uL (ref 0.00–0.07)
Basophils Absolute: 0 10*3/uL (ref 0.0–0.1)
Basophils Relative: 1 %
Eosinophils Absolute: 0.1 10*3/uL (ref 0.0–0.5)
Eosinophils Relative: 2 %
HCT: 36.6 % (ref 36.0–46.0)
Hemoglobin: 12.5 g/dL (ref 12.0–15.0)
Immature Granulocytes: 0 %
Lymphocytes Relative: 39 %
Lymphs Abs: 2.3 10*3/uL (ref 0.7–4.0)
MCH: 32 pg (ref 26.0–34.0)
MCHC: 34.2 g/dL (ref 30.0–36.0)
MCV: 93.6 fL (ref 80.0–100.0)
Monocytes Absolute: 0.6 10*3/uL (ref 0.1–1.0)
Monocytes Relative: 10 %
Neutro Abs: 2.9 10*3/uL (ref 1.7–7.7)
Neutrophils Relative %: 48 %
Platelets: 297 10*3/uL (ref 150–400)
RBC: 3.91 MIL/uL (ref 3.87–5.11)
RDW: 12.4 % (ref 11.5–15.5)
WBC: 6 10*3/uL (ref 4.0–10.5)
nRBC: 0 % (ref 0.0–0.2)

## 2018-08-05 MED ORDER — MORPHINE SULFATE (PF) 2 MG/ML IV SOLN
2.0000 mg | INTRAVENOUS | Status: DC | PRN
  Administered 2018-08-05 – 2018-08-08 (×14): 2 mg via INTRAVENOUS
  Filled 2018-08-05 (×15): qty 1

## 2018-08-05 MED ORDER — HYDROCODONE-ACETAMINOPHEN 5-325 MG PO TABS
2.0000 | ORAL_TABLET | Freq: Four times a day (QID) | ORAL | Status: DC | PRN
  Administered 2018-08-05 – 2018-08-08 (×12): 2 via ORAL
  Filled 2018-08-05 (×12): qty 2

## 2018-08-05 MED ORDER — LORAZEPAM 0.5 MG PO TABS
0.5000 mg | ORAL_TABLET | Freq: Every day | ORAL | Status: DC
  Administered 2018-08-05 – 2018-08-07 (×3): 0.5 mg via ORAL
  Filled 2018-08-05 (×3): qty 1

## 2018-08-05 NOTE — Plan of Care (Signed)

## 2018-08-05 NOTE — Progress Notes (Signed)
PROGRESS NOTE  Laura Simmons NIO:[id number] DOB: 1975-10-08 DOA: 08/02/2018 PCP: Shelda Pal, DO  HPI/Recap of past 24 hours: The patient is a 43 yr old woman who lives at home with her parents and does not work. She states that she has a past medical history of endometriosis. She states that she has not seen a doctor in some time. She denies any previous medical history. The patient states that she has a "couple" of drinks last night, but she did not eat much. She states that she awoke, but felt that she couldn't get up out of bed. She states that she thinks that she "had a stroke or seizure, or something". This has happened 4-5 times in the past. She states that this has happened before. She states that she frequently does not eat well due to her endometriosis. She states that she has seen a doctor for this in the past, but that she hasn't seen anyone for a while. She is not on any medications. She denies heavy drinking or illicit drug use.   The patient was brought to the ED by EMS. Her parents called them when the patient was found unresponsive, incontinent and staring to the left side. She was found to have a CBG of 45. She responded to glucagon and a50 cc D10 and was awake and moving spontaneously upon arrival.   In the ED the patient was found to have glucose of 141, lactic acid of 4.7, WBC of 20.4, and anion gap of 18. Ethyl alcohol was less than 10. Urine drug screen and CT head are pending. CXR showed no acute abnormality.  [facility] were called to admit the patient. She will be admitted to a telemetry bed. CT head unremarkable for any acute intracranial findings. EEG unrevealing.  Admitted for likely seizure activity.  She was seen by neurology on 08/04/2018, signed off.  Likely patient had a first seizure activity provoked by hypoglycemia, Ritalin and tramadol overuse.  No plan to start treatment at this time due to first time seizure which is likely provoked.   Will start AED if seizures recur per neurology.  08/05/18: Patient was seen and examined at her bedside this morning.  She reports having aches all over her body.  Patient had a positive blood culture 1 out of 4 growing GNR.  Was started on cefepime and 08/04/2018.  Low-grade temperature this morning with T-max of 99.1.   Assessment/Plan: Principal Problem:   Anxiety Active Problems:   Chronic pain disorder   Bipolar disorder with depression (HCC)   Mood disorder (HCC)   Hypoglycemia   Metabolic acidosis   Seizures (HCC)  Likely provoked seizure in the setting of hypoglycemia, Ritalin and tramadol overuse Patient was found minimally responsive with a bitten tongue and urinary incontinence. On Ritalin and tramadol with history of alcohol use and concern for withdrawal No prior personal or family history of seizures CT head, MRI brain, and EEG unrevealing. Suspected she had a first seizure She was seen by neurology.  No plan for treatment due to first-time seizure likely provoked.  Will start AEDs if seizure recurs. Continue to hold off tramadol and Ritalin as taken decreased seizure threshold IV Ativan as needed for seizures activity Continue seizure precautions  GNR bacteremia likely urinary source 1 out of 4 bottles grew GNR collected on 08/02/2018. Awaiting ID and sensitivities Continue cefepime started on 08/04/2018 Repeat blood cultures x2 tomorrow 08/06/2018  GNR UTI States she has had UTIs in the past but  none recently Urine culture collected on 08/03/2018 grew more than 200 colonies of GNR Repeated urine culture on 08/04/2018 Continue cefepime  Resolving leukocytosis after initiation of antibiotics WBC 6 from 13.6 from 20.4 on presentation Low-grade temperature this morning with T-max of 99.1 Continue to monitor fever curve and WBC  Alcohol use disorder with concern for withdrawal States she consumes liquor ~3 times a month 3-4 drinks Continue CIWA protocol  ADHD On  Ritalin Continue to hold Ritalin  Chronic right shoulder pain from rotator cuff injury Patient reports planned surgery on Monday 08/08/2018 outpatient She will need to call to possibly reschedule  Optimize pain control   General anxiety/depression Psych was consulted and patient declined medication management by psych stating that her mood has been relatively stable Continue home Klonopin  Insomnia Ativan nightly as needed  Constipation Start bowel regimen  Code Status: full Family Communication:  Will call if okay with the patient. Disposition Plan:  Home possibly in 2 to 3 days when we have the results of blood cultures ID and sensitivities and is hemodynamically stable.  DVT prophylaxis: Subcu Lovenox daily  Consultants: Neurology signed off on 08/04/2018.   Objective: Vitals:   08/04/18 0403 08/04/18 1215 08/04/18 1916 08/05/18 0427  BP: 133/80 136/83 124/88 (!) 134/95  Pulse: 73 73 68 70  Resp: 18 18 18 18   Temp: 98.4 F (36.9 C) 98.6 F (37 C) 99.1 F (37.3 C) 98 F (36.7 C)  TempSrc: Oral Oral Oral Oral  SpO2: 98% 100% 100% 100%  Weight: 50.8 kg   50.6 kg  Height:        Intake/Output Summary (Last 24 hours) at 08/05/2018 0807 Last data filed at 08/05/2018 4854 Gross per 24 hour  Intake 201.36 ml  Output 2600 ml  Net -2398.64 ml   Filed Weights   08/03/18 0507 08/04/18 0403 08/05/18 0427  Weight: 50.6 kg 50.8 kg 50.6 kg    Exam:  . General: 43 y.o. year-old female well-developed well-nourished in no acute distress.  Alert and oriented x4.   . Cardiovascular: Regular rate and rhythm.  No rubs or gallops.  No JVD or thyromegaly.   Marland Kitchen Respiratory: Clear to auscultation with no wheezes or rales.  Poor inspiratory effort. . Abdomen: Soft nontender nondistended.  Normal bowel sounds x4 quadrants. . Musculoskeletal: Lower extremity edema.  2 out of 4 pulses in all 4 extremities.  Right rotator cuff tender on palpation. Marland Kitchen Psychiatry: Mood is appropriate for  condition and setting.   Data Reviewed: CBC: Recent Labs  Lab 08/02/18 1428 08/03/18 0323 08/05/18 0616  WBC 20.4* 13.6* 6.0  NEUTROABS  --   --  2.9  HGB 13.4 11.8* 12.5  HCT 40.1 35.7* 36.6  MCV 96.2 96.5 93.6  PLT 421* 376 627   Basic Metabolic Panel: Recent Labs  Lab 08/02/18 1428 08/03/18 0323 08/03/18 1144 08/04/18 0419 08/05/18 0616  NA 140 138 136 141 138  K 3.8 3.8 3.5 3.7 3.5  CL 109 113* 111 112* 109  CO2 13* 15* 19* 24 24  GLUCOSE 156* 117* 160* 84 87  BUN 19 14 12  <5* 5*  CREATININE 0.92 1.00 0.75 0.73 0.65  CALCIUM 9.1 8.1* 8.5* 8.6* 8.5*   GFR: Estimated Creatinine Clearance: 72.4 mL/min (by C-G formula based on SCr of 0.65 mg/dL). Liver Function Tests: Recent Labs  Lab 08/02/18 1428 08/03/18 0323 08/03/18 1144  AST 26 27 24   ALT 15 19 19   ALKPHOS 44 35* 36*  BILITOT 0.5 0.7  0.7  PROT 6.7 5.2* 5.8*  ALBUMIN 3.9 3.0* 3.0*   No results for input(s): LIPASE, AMYLASE in the last 168 hours. No results for input(s): AMMONIA in the last 168 hours. Coagulation Profile: Recent Labs  Lab 08/02/18 1612  INR 1.0   Cardiac Enzymes: No results for input(s): CKTOTAL, CKMB, CKMBINDEX, TROPONINI in the last 168 hours. BNP (last 3 results) No results for input(s): PROBNP in the last 8760 hours. HbA1C: No results for input(s): HGBA1C in the last 72 hours. CBG: Recent Labs  Lab 08/04/18 1212 08/04/18 1627 08/04/18 1951 08/05/18 0028 08/05/18 0421  GLUCAP 78 87 106* 80 72   Lipid Profile: No results for input(s): CHOL, HDL, LDLCALC, TRIG, CHOLHDL, LDLDIRECT in the last 72 hours. Thyroid Function Tests: No results for input(s): TSH, T4TOTAL, FREET4, T3FREE, THYROIDAB in the last 72 hours. Anemia Panel: No results for input(s): VITAMINB12, FOLATE, FERRITIN, TIBC, IRON, RETICCTPCT in the last 72 hours. Urine analysis:    Component Value Date/Time   COLORURINE YELLOW 08/02/2018 2111   APPEARANCEUR HAZY (A) 08/02/2018 2111   LABSPEC 1.014  08/02/2018 2111   PHURINE 5.0 08/02/2018 2111   GLUCOSEU NEGATIVE 08/02/2018 2111   HGBUR NEGATIVE 08/02/2018 2111   BILIRUBINUR NEGATIVE 08/02/2018 2111   BILIRUBINUR n 10/07/2015 1010   KETONESUR 20 (A) 08/02/2018 2111   PROTEINUR NEGATIVE 08/02/2018 2111   UROBILINOGEN 0.2 10/07/2015 1010   UROBILINOGEN 1.0 01/19/2013 1501   NITRITE POSITIVE (A) 08/02/2018 2111   LEUKOCYTESUR NEGATIVE 08/02/2018 2111   Sepsis Labs: @LABRCNTIP (procalcitonin:4,lacticidven:4)  ) Recent Results (from the past 240 hour(s))  Blood culture (routine x 2)     Status: None (Preliminary result)   Collection Time: 08/02/18  2:54 PM   Specimen: BLOOD  Result Value Ref Range Status   Specimen Description BLOOD RIGHT ANTECUBITAL  Final   Special Requests   Final    BOTTLES DRAWN AEROBIC AND ANAEROBIC Blood Culture adequate volume   Culture  Setup Time   Final    GRAM NEGATIVE RODS ANAEROBIC BOTTLE ONLY CRITICAL RESULT CALLED TO, READ BACK BY AND VERIFIED WITH: L POWELL PHARMD 08/04/18 1829 JDW Performed at [facility], [place] [address], [place], [place] [address]    Culture GRAM NEGATIVE RODS  Final   Report Status PENDING  Incomplete  Blood Culture ID Panel (Reflexed)     Status: None   Collection Time: 08/02/18  2:54 PM  Result Value Ref Range Status   Enterococcus species NOT DETECTED NOT DETECTED Final   Listeria monocytogenes NOT DETECTED NOT DETECTED Final   Staphylococcus species NOT DETECTED NOT DETECTED Final   Staphylococcus aureus (BCID) NOT DETECTED NOT DETECTED Final   Streptococcus species NOT DETECTED NOT DETECTED Final   Streptococcus agalactiae NOT DETECTED NOT DETECTED Final   Streptococcus pneumoniae NOT DETECTED NOT DETECTED Final   Streptococcus pyogenes NOT DETECTED NOT DETECTED Final   Acinetobacter baumannii NOT DETECTED NOT DETECTED Final   Enterobacteriaceae species NOT DETECTED NOT DETECTED Final   Enterobacter cloacae complex NOT DETECTED NOT DETECTED Final    Escherichia coli NOT DETECTED NOT DETECTED Final   Klebsiella oxytoca NOT DETECTED NOT DETECTED Final   Klebsiella pneumoniae NOT DETECTED NOT DETECTED Final   Proteus species NOT DETECTED NOT DETECTED Final   Serratia marcescens NOT DETECTED NOT DETECTED Final   Haemophilus influenzae NOT DETECTED NOT DETECTED Final   Neisseria meningitidis NOT DETECTED NOT DETECTED Final   Pseudomonas aeruginosa NOT DETECTED NOT DETECTED Final   Candida albicans NOT  DETECTED NOT DETECTED Final   Candida glabrata NOT DETECTED NOT DETECTED Final   Candida krusei NOT DETECTED NOT DETECTED Final   Candida parapsilosis NOT DETECTED NOT DETECTED Final   Candida tropicalis NOT DETECTED NOT DETECTED Final    Comment: Performed at [facility], [place] [address] [place], [place] [address]  Blood culture (routine x 2)     Status: None (Preliminary result)   Collection Time: 08/02/18  2:55 PM   Specimen: BLOOD RIGHT HAND  Result Value Ref Range Status   Specimen Description BLOOD RIGHT HAND  Final   Special Requests   Final    BOTTLES DRAWN AEROBIC AND ANAEROBIC Blood Culture results may not be optimal due to an inadequate volume of blood received in culture bottles   Culture   Final    NO GROWTH 2 DAYS Performed at [facility], [place] [address], [place], [place] [address]    Report Status PENDING  Incomplete  SARS Coronavirus 2 (CEPHEID - Performed in [facility] lab), Hosp Order     Status: None   Collection Time: 08/02/18  6:23 PM   Specimen: Nasopharyngeal Swab  Result Value Ref Range Status   SARS Coronavirus 2 NEGATIVE NEGATIVE Final    Comment: (NOTE) If result is NEGATIVE SARS-CoV-2 target nucleic acids are NOT DETECTED. The SARS-CoV-2 RNA is generally detectable in upper and lower  respiratory specimens during the acute phase of infection. The lowest  concentration of SARS-CoV-2 viral copies this assay can detect is 250  copies / mL. A negative result does not preclude  SARS-CoV-2 infection  and should not be used as the sole basis for treatment or other  patient management decisions.  A negative result may occur with  improper specimen collection / handling, submission of specimen other  than nasopharyngeal swab, presence of viral mutation(s) within the  areas targeted by this assay, and inadequate number of viral copies  (<250 copies / mL). A negative result must be combined with clinical  observations, patient history, and epidemiological information. If result is POSITIVE SARS-CoV-2 target nucleic acids are DETECTED. The SARS-CoV-2 RNA is generally detectable in upper and lower  respiratory specimens dur ing the acute phase of infection.  Positive  results are indicative of active infection with SARS-CoV-2.  Clinical  correlation with patient history and other diagnostic information is  necessary to determine patient infection status.  Positive results do  not rule out bacterial infection or co-infection with other viruses. If result is PRESUMPTIVE POSTIVE SARS-CoV-2 nucleic acids MAY BE PRESENT.   A presumptive positive result was obtained on the submitted specimen  and confirmed on repeat testing.  While 2019 novel coronavirus  (SARS-CoV-2) nucleic acids may be present in the submitted sample  additional confirmatory testing may be necessary for epidemiological  and / or clinical management purposes  to differentiate between  SARS-CoV-2 and other Sarbecovirus currently known to infect humans.  If clinically indicated additional testing with an alternate test  methodology [phone]) is advised. The SARS-CoV-2 RNA is generally  detectable in upper and lower respiratory sp ecimens during the acute  phase of infection. The expected result is Negative. Fact Sheet for Patients:  StrictlyIdeas.no Fact Sheet for Healthcare Providers: BankingDealers.co.za This test is not yet approved or cleared by the  [place] FDA and has been authorized for detection and/or diagnosis of SARS-CoV-2 by FDA under an Emergency Use Authorization (EUA).  This EUA will remain in effect (meaning this test can be  used) for the duration of the COVID-19 declaration under Section 564(b)(1) of the Act, 21 U.S.C. section 360bbb-3(b)(1), unless the authorization is terminated or revoked sooner. Performed at [facility], [place] [address] [place], [place] [address]   Urine culture     Status: Abnormal (Preliminary result)   Collection Time: 08/03/18  3:15 AM   Specimen: In/Out Cath Urine  Result Value Ref Range Status   Specimen Description IN/OUT CATH URINE  Final   Special Requests NONE  Final   Culture (A)  Final    200 COLONIES/mL GRAM NEGATIVE RODS CULTURE REINCUBATED FOR BETTER GROWTH Performed at [facility], [place] [address], [place], [place] [address]    Report Status PENDING  Incomplete      Studies: Mr Jeri Cos EY Contrast  Result Date: 08/04/2018 CLINICAL DATA:  Found unresponsive.  Possible seizure. EXAM: MRI HEAD WITHOUT AND WITH CONTRAST TECHNIQUE: Multiplanar, multiecho pulse sequences of the brain and surrounding structures were obtained without and with intravenous contrast. CONTRAST:  5 cc Gadavist COMPARISON:  Head CT 08/02/2018 FINDINGS: Brain: The brain has a normal appearance without evidence of malformation, atrophy, old or acute small or large vessel infarction, mass lesion, hemorrhage, hydrocephalus or extra-axial collection. Mesial temporal lobes appear normal and symmetric. After contrast administration, no abnormal enhancement occurs. Vascular: Major vessels at the base of the brain show flow. Venous sinuses appear patent. Skull and upper cervical spine: Normal. Sinuses/Orbits: Clear/normal. Other: None significant. IMPRESSION: Normal examination. No abnormality seen to explain the clinical presentation. Electronically Signed   By: Nelson Chimes M.D.   On: 08/04/2018 12:37     Scheduled Meds: . enoxaparin (LOVENOX) injection  40 mg Subcutaneous Q24H  . folic acid  1 mg Oral Daily  . LORazepam  0.5 mg Oral QHS  . multivitamin with minerals  1 tablet Oral Daily  . thiamine  100 mg Oral Daily   Or  . thiamine  100 mg Intravenous Daily    Continuous Infusions: . sodium chloride 250 mL (08/05/18 0448)  . ceFEPime (MAXIPIME) IV 2 g (08/05/18 0449)     LOS: 2 days     Kayleen Memos, MD [facility] Pager [phone]  If 7PM-7AM, please contact night-coverage [email or web address] Password [facility] 08/05/2018, 8:07 AM

## 2018-08-06 DIAGNOSIS — R7881 Bacteremia: Secondary | ICD-10-CM

## 2018-08-06 DIAGNOSIS — E162 Hypoglycemia, unspecified: Secondary | ICD-10-CM

## 2018-08-06 DIAGNOSIS — F909 Attention-deficit hyperactivity disorder, unspecified type: Secondary | ICD-10-CM

## 2018-08-06 DIAGNOSIS — F10929 Alcohol use, unspecified with intoxication, unspecified: Secondary | ICD-10-CM

## 2018-08-06 LAB — GLUCOSE, CAPILLARY
Glucose-Capillary: 102 mg/dL — ABNORMAL HIGH (ref 70–99)
Glucose-Capillary: 73 mg/dL (ref 70–99)
Glucose-Capillary: 73 mg/dL (ref 70–99)
Glucose-Capillary: 78 mg/dL (ref 70–99)
Glucose-Capillary: 81 mg/dL (ref 70–99)
Glucose-Capillary: 81 mg/dL (ref 70–99)

## 2018-08-06 LAB — CBC WITH DIFFERENTIAL/PLATELET
Abs Immature Granulocytes: 0.02 10*3/uL (ref 0.00–0.07)
Basophils Absolute: 0 10*3/uL (ref 0.0–0.1)
Basophils Relative: 1 %
Eosinophils Absolute: 0.2 10*3/uL (ref 0.0–0.5)
Eosinophils Relative: 3 %
HCT: 37.6 % (ref 36.0–46.0)
Hemoglobin: 13 g/dL (ref 12.0–15.0)
Immature Granulocytes: 0 %
Lymphocytes Relative: 41 %
Lymphs Abs: 2.7 10*3/uL (ref 0.7–4.0)
MCH: 32.1 pg (ref 26.0–34.0)
MCHC: 34.6 g/dL (ref 30.0–36.0)
MCV: 92.8 fL (ref 80.0–100.0)
Monocytes Absolute: 0.5 10*3/uL (ref 0.1–1.0)
Monocytes Relative: 8 %
Neutro Abs: 3.1 10*3/uL (ref 1.7–7.7)
Neutrophils Relative %: 47 %
Platelets: 268 10*3/uL (ref 150–400)
RBC: 4.05 MIL/uL (ref 3.87–5.11)
RDW: 12.2 % (ref 11.5–15.5)
WBC: 6.5 10*3/uL (ref 4.0–10.5)
nRBC: 0 % (ref 0.0–0.2)

## 2018-08-06 LAB — URINE CULTURE
Culture: 200 — AB
Culture: NO GROWTH

## 2018-08-06 MED ORDER — PANTOPRAZOLE SODIUM 40 MG PO TBEC
40.0000 mg | DELAYED_RELEASE_TABLET | Freq: Every day | ORAL | Status: DC
  Administered 2018-08-06 – 2018-08-08 (×3): 40 mg via ORAL
  Filled 2018-08-06 (×4): qty 1

## 2018-08-06 NOTE — Progress Notes (Signed)
Spoke with pt's mother Makynna Manocchio [phone] (house phone) regarding pt's plan of care with pt's permission.

## 2018-08-06 NOTE — Progress Notes (Signed)
PROGRESS NOTE    Laura Simmons  GGY:[id number]  DOB: 1975-10-05  DOA: 08/02/2018 PCP: Shelda Pal, DO  Brief Narrative: 43 year old female with history of endometriosis and chronic alcohol use who lives with her parents was brought in after being found unresponsive, incontinent and staring to the left side.  She was found to have blood glucose of 45 on EMS evaluation and responded to D10/glucagon.She reported few other episodes of unexplained transient loss of awareness in the recent past.In the ED the patient was found to have glucose of 141, lactic acid of 4.7, WBC of 20.4, and anion gap of 18. Ethyl alcohol was less than 10 (states she consumes liquor ~3 times a month 3-4 drinks). Urine drug screen and CT head were unremarkable. CXR showed no acute abnormality.  Patient also evaluated by neurology and EEG was within normal limits.  Neurology felt patient had hypoglycemia related versus medication induced (Ritalin/tramadol overuse) seizure.  Hospital course has been complicated by leukocytosis/bacteremia for which she is receiving empiric antibiotics.  Repeat blood cultures sent on July 18.  Subjective: Patient denies any cough or shortness of breath.  Resting comfortably.  She does report painful swelling along her left elbow at previous IV site which has now been replaced to right arm.    Objective: Vitals:   08/05/18 1147 08/05/18 2033 08/06/18 0544 08/06/18 1136  BP: 126/89 135/89 131/83 132/85  Pulse: 75 66 61 (!) 58  Resp: 16 18 18 18   Temp: 98.6 F (37 C) 98.4 F (36.9 C) 98.2 F (36.8 C) 98.4 F (36.9 C)  TempSrc: Oral Oral Oral Oral  SpO2: 99% 100% 99% 99%  Weight:   50.8 kg   Height:        Intake/Output Summary (Last 24 hours) at 08/06/2018 1503 Last data filed at 08/06/2018 1248 Gross per 24 hour  Intake 1962.2 ml  Output 1300 ml  Net 662.2 ml   Filed Weights   08/04/18 0403 08/05/18 0427 08/06/18 0544  Weight: 50.8 kg 50.6 kg 50.8 kg    Physical  Examination:  General exam: Appears calm and comfortable  Respiratory system: Clear to auscultation. Respiratory effort normal. Cardiovascular system: S1 & S2 heard, RRR. No JVD, murmurs, rubs, gallops or clicks. No pedal edema. Gastrointestinal system: Abdomen is nondistended, soft and nontender. No organomegaly or masses felt. Normal bowel sounds heard. Central nervous system: Alert and oriented. No focal neurological deficits. Extremities: Small area of redness/puffiness with crepitus along the left antecubital area/old IV site. Skin: No rashes, lesions or ulcers Psychiatry: Judgement and insight appear normal. Mood & affect appropriate.     Data Reviewed: I have personally reviewed following labs and imaging studies  CBC: Recent Labs  Lab 08/02/18 1428 08/03/18 0323 08/05/18 0616 08/06/18 0533  WBC 20.4* 13.6* 6.0 6.5  NEUTROABS  --   --  2.9 3.1  HGB 13.4 11.8* 12.5 13.0  HCT 40.1 35.7* 36.6 37.6  MCV 96.2 96.5 93.6 92.8  PLT 421* 376 297 035   Basic Metabolic Panel: Recent Labs  Lab 08/02/18 1428 08/03/18 0323 08/03/18 1144 08/04/18 0419 08/05/18 0616  NA 140 138 136 141 138  K 3.8 3.8 3.5 3.7 3.5  CL 109 113* 111 112* 109  CO2 13* 15* 19* 24 24  GLUCOSE 156* 117* 160* 84 87  BUN 19 14 12  <5* 5*  CREATININE 0.92 1.00 0.75 0.73 0.65  CALCIUM 9.1 8.1* 8.5* 8.6* 8.5*   GFR: Estimated Creatinine Clearance: 72.7 mL/min (by C-G formula based  on SCr of 0.65 mg/dL). Liver Function Tests: Recent Labs  Lab 08/02/18 1428 08/03/18 0323 08/03/18 1144  AST 26 27 24   ALT 15 19 19   ALKPHOS 44 35* 36*  BILITOT 0.5 0.7 0.7  PROT 6.7 5.2* 5.8*  ALBUMIN 3.9 3.0* 3.0*   No results for input(s): LIPASE, AMYLASE in the last 168 hours. No results for input(s): AMMONIA in the last 168 hours. Coagulation Profile: Recent Labs  Lab 08/02/18 1612  INR 1.0   Cardiac Enzymes: No results for input(s): CKTOTAL, CKMB, CKMBINDEX, TROPONINI in the last 168 hours. BNP (last 3  results) No results for input(s): PROBNP in the last 8760 hours. HbA1C: No results for input(s): HGBA1C in the last 72 hours. CBG: Recent Labs  Lab 08/05/18 2046 08/06/18 0004 08/06/18 0422 08/06/18 0729 08/06/18 1138  GLUCAP 104* 78 73 73 81   Lipid Profile: No results for input(s): CHOL, HDL, LDLCALC, TRIG, CHOLHDL, LDLDIRECT in the last 72 hours. Thyroid Function Tests: No results for input(s): TSH, T4TOTAL, FREET4, T3FREE, THYROIDAB in the last 72 hours. Anemia Panel: No results for input(s): VITAMINB12, FOLATE, FERRITIN, TIBC, IRON, RETICCTPCT in the last 72 hours. Sepsis Labs: Recent Labs  Lab 08/02/18 1428 08/02/18 1711 08/02/18 2112  LATICACIDVEN 4.7* 1.5 1.9    Recent Results (from the past 240 hour(s))  Blood culture (routine x 2)     Status: Abnormal (Preliminary result)   Collection Time: 08/02/18  2:54 PM   Specimen: BLOOD  Result Value Ref Range Status   Specimen Description BLOOD RIGHT ANTECUBITAL  Final   Special Requests   Final    BOTTLES DRAWN AEROBIC AND ANAEROBIC Blood Culture adequate volume   Culture  Setup Time   Final    GRAM NEGATIVE RODS ANAEROBIC BOTTLE ONLY CRITICAL RESULT CALLED TO, READ BACK BY AND VERIFIED WITH: L POWELL PHARMD 08/04/18 1829 JDW    Culture (A)  Final    ANAEROBIC GRAM NEGATIVE ROD IDENTIFICATION TO FOLLOW Performed at [facility], [place] [address], [place], [place] [address]    Report Status PENDING  Incomplete  Blood Culture ID Panel (Reflexed)     Status: None   Collection Time: 08/02/18  2:54 PM  Result Value Ref Range Status   Enterococcus species NOT DETECTED NOT DETECTED Final   Listeria monocytogenes NOT DETECTED NOT DETECTED Final   Staphylococcus species NOT DETECTED NOT DETECTED Final   Staphylococcus aureus (BCID) NOT DETECTED NOT DETECTED Final   Streptococcus species NOT DETECTED NOT DETECTED Final   Streptococcus agalactiae NOT DETECTED NOT DETECTED Final   Streptococcus pneumoniae NOT DETECTED  NOT DETECTED Final   Streptococcus pyogenes NOT DETECTED NOT DETECTED Final   Acinetobacter baumannii NOT DETECTED NOT DETECTED Final   Enterobacteriaceae species NOT DETECTED NOT DETECTED Final   Enterobacter cloacae complex NOT DETECTED NOT DETECTED Final   Escherichia coli NOT DETECTED NOT DETECTED Final   Klebsiella oxytoca NOT DETECTED NOT DETECTED Final   Klebsiella pneumoniae NOT DETECTED NOT DETECTED Final   Proteus species NOT DETECTED NOT DETECTED Final   Serratia marcescens NOT DETECTED NOT DETECTED Final   Haemophilus influenzae NOT DETECTED NOT DETECTED Final   Neisseria meningitidis NOT DETECTED NOT DETECTED Final   Pseudomonas aeruginosa NOT DETECTED NOT DETECTED Final   Candida albicans NOT DETECTED NOT DETECTED Final   Candida glabrata NOT DETECTED NOT DETECTED Final   Candida krusei NOT DETECTED NOT DETECTED Final   Candida parapsilosis NOT DETECTED NOT DETECTED Final   Candida tropicalis NOT  DETECTED NOT DETECTED Final    Comment: Performed at [facility], [place] [address] [place], [place] [address]  Blood culture (routine x 2)     Status: None (Preliminary result)   Collection Time: 08/02/18  2:55 PM   Specimen: BLOOD RIGHT HAND  Result Value Ref Range Status   Specimen Description BLOOD RIGHT HAND  Final   Special Requests   Final    BOTTLES DRAWN AEROBIC AND ANAEROBIC Blood Culture results may not be optimal due to an inadequate volume of blood received in culture bottles   Culture   Final    NO GROWTH 4 DAYS Performed at [facility], [place] [address], [place], [place] [address]    Report Status PENDING  Incomplete  SARS Coronavirus 2 (CEPHEID - Performed in [place] hospital lab), Hosp Order     Status: None   Collection Time: 08/02/18  6:23 PM   Specimen: Nasopharyngeal Swab  Result Value Ref Range Status   SARS Coronavirus 2 NEGATIVE NEGATIVE Final    Comment: (NOTE) If result is NEGATIVE SARS-CoV-2 target nucleic acids are NOT DETECTED.  The SARS-CoV-2 RNA is generally detectable in upper and lower  respiratory specimens during the acute phase of infection. The lowest  concentration of SARS-CoV-2 viral copies this assay can detect is 250  copies / mL. A negative result does not preclude SARS-CoV-2 infection  and should not be used as the sole basis for treatment or other  patient management decisions.  A negative result may occur with  improper specimen collection / handling, submission of specimen other  than nasopharyngeal swab, presence of viral mutation(s) within the  areas targeted by this assay, and inadequate number of viral copies  (<250 copies / mL). A negative result must be combined with clinical  observations, patient history, and epidemiological information. If result is POSITIVE SARS-CoV-2 target nucleic acids are DETECTED. The SARS-CoV-2 RNA is generally detectable in upper and lower  respiratory specimens dur ing the acute phase of infection.  Positive  results are indicative of active infection with SARS-CoV-2.  Clinical  correlation with patient history and other diagnostic information is  necessary to determine patient infection status.  Positive results do  not rule out bacterial infection or co-infection with other viruses. If result is PRESUMPTIVE POSTIVE SARS-CoV-2 nucleic acids MAY BE PRESENT.   A presumptive positive result was obtained on the submitted specimen  and confirmed on repeat testing.  While 2019 novel coronavirus  (SARS-CoV-2) nucleic acids may be present in the submitted sample  additional confirmatory testing may be necessary for epidemiological  and / or clinical management purposes  to differentiate between  SARS-CoV-2 and other Sarbecovirus currently known to infect humans.  If clinically indicated additional testing with an alternate test  methodology [phone]) is advised. The SARS-CoV-2 RNA is generally  detectable in upper and lower respiratory sp ecimens during the acute   phase of infection. The expected result is Negative. Fact Sheet for Patients:  StrictlyIdeas.no Fact Sheet for Healthcare Providers: BankingDealers.co.za This test is not yet approved or cleared by the [place] FDA and has been authorized for detection and/or diagnosis of SARS-CoV-2 by FDA under an Emergency Use Authorization (EUA).  This EUA will remain in effect (meaning this test can be used) for the duration of the COVID-19 declaration under Section 564(b)(1) of the Act, 21 U.S.C. section 360bbb-3(b)(1), unless the authorization is terminated or revoked sooner. Performed at [facility], [place] [address],  [place], [place] [address]   Urine culture     Status: Abnormal   Collection Time: 08/03/18  3:15 AM   Specimen: In/Out Cath Urine  Result Value Ref Range Status   Specimen Description IN/OUT CATH URINE  Final   Special Requests   Final    NONE Performed at [facility], [place] [address] [place], [place] [address]    Culture (A)  Final    200 COLONIES/mL ESCHERICHIA COLI 100 COLONIES/mL STAPHYLOCOCCUS EPIDERMIDIS    Report Status 08/06/2018 FINAL  Final   Organism ID, Bacteria ESCHERICHIA COLI (A)  Final   Organism ID, Bacteria STAPHYLOCOCCUS EPIDERMIDIS (A)  Final      Susceptibility   Escherichia coli - MIC*    AMPICILLIN >=32 RESISTANT Resistant     CEFAZOLIN <=4 SENSITIVE Sensitive     CEFTRIAXONE <=1 SENSITIVE Sensitive     CIPROFLOXACIN <=0.25 SENSITIVE Sensitive     GENTAMICIN <=1 SENSITIVE Sensitive     IMIPENEM <=0.25 SENSITIVE Sensitive     NITROFURANTOIN 32 SENSITIVE Sensitive     TRIMETH/SULFA <=20 SENSITIVE Sensitive     AMPICILLIN/SULBACTAM >=32 RESISTANT Resistant     PIP/TAZO <=4 SENSITIVE Sensitive     Extended ESBL NEGATIVE Sensitive     * 200 COLONIES/mL ESCHERICHIA COLI   Staphylococcus epidermidis - MIC*    CIPROFLOXACIN <=0.5 SENSITIVE Sensitive     GENTAMICIN <=0.5 SENSITIVE  Sensitive     NITROFURANTOIN <=16 SENSITIVE Sensitive     OXACILLIN <=0.25 SENSITIVE Sensitive     TETRACYCLINE <=1 SENSITIVE Sensitive     VANCOMYCIN 1 SENSITIVE Sensitive     TRIMETH/SULFA <=10 SENSITIVE Sensitive     CLINDAMYCIN <=0.25 SENSITIVE Sensitive     RIFAMPIN <=0.5 SENSITIVE Sensitive     Inducible Clindamycin NEGATIVE Sensitive     * 100 COLONIES/mL STAPHYLOCOCCUS EPIDERMIDIS  Culture, Urine     Status: None   Collection Time: 08/04/18  9:39 PM   Specimen: Urine, Clean Catch  Result Value Ref Range Status   Specimen Description URINE, CLEAN CATCH  Final   Special Requests NONE  Final   Culture   Final    NO GROWTH Performed at [facility], [address], [place], [place] [address]    Report Status 08/06/2018 FINAL  Final      Radiology Studies: No results found.      Scheduled Meds: . enoxaparin (LOVENOX) injection  40 mg Subcutaneous Q24H  . folic acid  1 mg Oral Daily  . LORazepam  0.5 mg Oral QHS  . multivitamin with minerals  1 tablet Oral Daily  . thiamine  100 mg Oral Daily   Continuous Infusions: . sodium chloride 250 mL (08/06/18 1232)  . ceFEPime (MAXIPIME) IV 2 g (08/06/18 1235)    Assessment & Plan:    1.  Provoked seizure with postictal altered mental status: Felt to have hypoglycemia or medication induced seizure given findings of urinary incontinence and tongue bite.  Seen by neurology who spoke to patient's mother who reported that patient was abusing tramadol.  Avoid tramadol/retalin/alcohol use.  Urine drug screen negative on admission.  Blood glucose now normalized.  Patient not started on antiepileptics given isolated witnessed event. CT head/MRI brain and EEG within normal limits. Will need neurology follow-up as outpatient.  No driving restrictions advised by neurology for now.    2.  Leukocytosis/bacteremia: Blood culture growing 1 out of 2 bottles gram-negative rods from July 14.  Could be contamination.  Urine culture  showed  insignificant growth.  Repeat blood cultures and urine cultures sent.  Remains afebrile and leukocytosis has now resolved.  She remains on empiric cefepime  3.  History of migraines:She had seen Dr. Posey Pronto at [facility] neurology at some point but did not follow-up.  She is not on any prophylactic medications for migraine anymore.  According to neurology note, she was tried on multiple medications by Dr. Posey Pronto: Depakote, propranolol, topiramate, verapamil, zonisamide, Zoloft, Celexa, Lexapro, Cymbalta, gabapentin, Seroquel, triptans and tizanidine but not tried Botox or could not remember if she has tried nortriptyline or amitriptyline.  She had tried sphenopalatine block without relief. She never had complex migraine-like symptoms.  4.  Alcohol use: Denies prior withdrawals requiring hospitalization.  Continue thiamine/multivitamin/folate  5.  ADHD: Retalin held for now  6.  Endometriosis: Follow-up primary GYN upon discharge.  No acute issues currently  7.  GERD: PPI  DVT prophylaxis: Lovenox Code Status: Full code Family / Patient Communication: Discussed with patient Disposition Plan: Home when medically cleared     LOS: 3 days    Time spent: 35 minutes    Guilford Shi, MD [facility] Pager [phone]  If 7PM-7AM, please contact night-coverage [email or web address] Password [facility] 08/06/2018, 3:03 PM

## 2018-08-07 LAB — CULTURE, BLOOD (ROUTINE X 2): Culture: NO GROWTH

## 2018-08-07 LAB — GLUCOSE, CAPILLARY
Glucose-Capillary: 103 mg/dL — ABNORMAL HIGH (ref 70–99)
Glucose-Capillary: 131 mg/dL — ABNORMAL HIGH (ref 70–99)
Glucose-Capillary: 71 mg/dL (ref 70–99)
Glucose-Capillary: 72 mg/dL (ref 70–99)
Glucose-Capillary: 81 mg/dL (ref 70–99)
Glucose-Capillary: 88 mg/dL (ref 70–99)

## 2018-08-07 MED ORDER — CEFDINIR 300 MG PO CAPS
300.0000 mg | ORAL_CAPSULE | Freq: Two times a day (BID) | ORAL | Status: DC
  Administered 2018-08-07 – 2018-08-08 (×3): 300 mg via ORAL
  Filled 2018-08-07 (×3): qty 1

## 2018-08-07 NOTE — Plan of Care (Signed)

## 2018-08-07 NOTE — Progress Notes (Signed)
PROGRESS NOTE    Laura Simmons  BMW:[id number]  DOB: 08/10/1975  DOA: 08/02/2018 PCP: Shelda Pal, DO  Brief Narrative: 43 year old female with history of endometriosis and chronic alcohol use who lives with her parents was brought in after being found unresponsive, incontinent and staring to the left side.  She was found to have blood glucose of 45 on EMS evaluation and responded to D10/glucagon.She reported few other episodes of unexplained transient loss of awareness in the recent past.In the ED the patient was found to have glucose of 141, lactic acid of 4.7, WBC of 20.4, and anion gap of 18. Ethyl alcohol was less than 10 (states she consumes liquor ~3 times a month 3-4 drinks). Urine drug screen and CT head were unremarkable. CXR showed no acute abnormality.  Patient also evaluated by neurology and EEG was within normal limits.  Neurology felt patient had hypoglycemia related versus medication induced (Ritalin/tramadol overuse) seizure.  Hospital course has been complicated by leukocytosis/bacteremia for which she is receiving empiric antibiotics.  Repeat blood cultures sent on July 18.  Subjective: No acute events overnight.  Resting comfortably.  Afebrile    Objective: Vitals:   08/06/18 0544 08/06/18 1136 08/06/18 2045 08/07/18 0445  BP: 131/83 132/85 (!) 144/85 (!) 104/94  Pulse: 61 (!) 58 62 70  Resp: 18 18 18 18   Temp: 98.2 F (36.8 C) 98.4 F (36.9 C) 98.4 F (36.9 C) 98.5 F (36.9 C)  TempSrc: Oral Oral Oral Oral  SpO2: 99% 99% 100% 100%  Weight: 50.8 kg   50.2 kg  Height:        Intake/Output Summary (Last 24 hours) at 08/07/2018 0835 Last data filed at 08/07/2018 0549 Gross per 24 hour  Intake 1412.58 ml  Output 2800 ml  Net -1387.42 ml   Filed Weights   08/05/18 0427 08/06/18 0544 08/07/18 0445  Weight: 50.6 kg 50.8 kg 50.2 kg    Physical Examination:  General exam: Appears calm and comfortable  Respiratory system: Clear to auscultation.  Respiratory effort normal. Cardiovascular system: S1 & S2 heard, RRR. No JVD, murmurs, rubs, gallops or clicks. No pedal edema. Gastrointestinal system: Abdomen is nondistended, soft and nontender. No organomegaly or masses felt. Normal bowel sounds heard. Central nervous system: Alert and oriented. No focal neurological deficits. Extremities: Small area of redness/puffiness with crepitus along the left antecubital area/old IV site. Skin: No rashes, lesions or ulcers Psychiatry: Judgement and insight appear normal. Mood & affect appropriate.     Data Reviewed: I have personally reviewed following labs and imaging studies  CBC: Recent Labs  Lab 08/02/18 1428 08/03/18 0323 08/05/18 0616 08/06/18 0533  WBC 20.4* 13.6* 6.0 6.5  NEUTROABS  --   --  2.9 3.1  HGB 13.4 11.8* 12.5 13.0  HCT 40.1 35.7* 36.6 37.6  MCV 96.2 96.5 93.6 92.8  PLT 421* 376 297 272   Basic Metabolic Panel: Recent Labs  Lab 08/02/18 1428 08/03/18 0323 08/03/18 1144 08/04/18 0419 08/05/18 0616  NA 140 138 136 141 138  K 3.8 3.8 3.5 3.7 3.5  CL 109 113* 111 112* 109  CO2 13* 15* 19* 24 24  GLUCOSE 156* 117* 160* 84 87  BUN 19 14 12  <5* 5*  CREATININE 0.92 1.00 0.75 0.73 0.65  CALCIUM 9.1 8.1* 8.5* 8.6* 8.5*   GFR: Estimated Creatinine Clearance: 71.9 mL/min (by C-G formula based on SCr of 0.65 mg/dL). Liver Function Tests: Recent Labs  Lab 08/02/18 1428 08/03/18 0323 08/03/18 1144  AST 26  27 24  ALT 15 19 19   ALKPHOS 44 35* 36*  BILITOT 0.5 0.7 0.7  PROT 6.7 5.2* 5.8*  ALBUMIN 3.9 3.0* 3.0*   No results for input(s): LIPASE, AMYLASE in the last 168 hours. No results for input(s): AMMONIA in the last 168 hours. Coagulation Profile: Recent Labs  Lab 08/02/18 1612  INR 1.0   Cardiac Enzymes: No results for input(s): CKTOTAL, CKMB, CKMBINDEX, TROPONINI in the last 168 hours. BNP (last 3 results) No results for input(s): PROBNP in the last 8760 hours. HbA1C: No results for input(s):  HGBA1C in the last 72 hours. CBG: Recent Labs  Lab 08/06/18 1620 08/06/18 2048 08/07/18 0014 08/07/18 0427 08/07/18 0743  GLUCAP 102* 81 72 71 131*   Lipid Profile: No results for input(s): CHOL, HDL, LDLCALC, TRIG, CHOLHDL, LDLDIRECT in the last 72 hours. Thyroid Function Tests: No results for input(s): TSH, T4TOTAL, FREET4, T3FREE, THYROIDAB in the last 72 hours. Anemia Panel: No results for input(s): VITAMINB12, FOLATE, FERRITIN, TIBC, IRON, RETICCTPCT in the last 72 hours. Sepsis Labs: Recent Labs  Lab 08/02/18 1428 08/02/18 1711 08/02/18 2112  LATICACIDVEN 4.7* 1.5 1.9    Recent Results (from the past 240 hour(s))  Blood culture (routine x 2)     Status: Abnormal (Preliminary result)   Collection Time: 08/02/18  2:54 PM   Specimen: BLOOD  Result Value Ref Range Status   Specimen Description BLOOD RIGHT ANTECUBITAL  Final   Special Requests   Final    BOTTLES DRAWN AEROBIC AND ANAEROBIC Blood Culture adequate volume   Culture  Setup Time   Final    GRAM NEGATIVE RODS ANAEROBIC BOTTLE ONLY CRITICAL RESULT CALLED TO, READ BACK BY AND VERIFIED WITH: L POWELL PHARMD 08/04/18 1829 JDW    Culture (A)  Final    ANAEROBIC GRAM NEGATIVE ROD IDENTIFICATION TO FOLLOW Performed at [facility], [place] [address], [place], [place] [address]    Report Status PENDING  Incomplete  Blood Culture ID Panel (Reflexed)     Status: None   Collection Time: 08/02/18  2:54 PM  Result Value Ref Range Status   Enterococcus species NOT DETECTED NOT DETECTED Final   Listeria monocytogenes NOT DETECTED NOT DETECTED Final   Staphylococcus species NOT DETECTED NOT DETECTED Final   Staphylococcus aureus (BCID) NOT DETECTED NOT DETECTED Final   Streptococcus species NOT DETECTED NOT DETECTED Final   Streptococcus agalactiae NOT DETECTED NOT DETECTED Final   Streptococcus pneumoniae NOT DETECTED NOT DETECTED Final   Streptococcus pyogenes NOT DETECTED NOT DETECTED Final   Acinetobacter  baumannii NOT DETECTED NOT DETECTED Final   Enterobacteriaceae species NOT DETECTED NOT DETECTED Final   Enterobacter cloacae complex NOT DETECTED NOT DETECTED Final   Escherichia coli NOT DETECTED NOT DETECTED Final   Klebsiella oxytoca NOT DETECTED NOT DETECTED Final   Klebsiella pneumoniae NOT DETECTED NOT DETECTED Final   Proteus species NOT DETECTED NOT DETECTED Final   Serratia marcescens NOT DETECTED NOT DETECTED Final   Haemophilus influenzae NOT DETECTED NOT DETECTED Final   Neisseria meningitidis NOT DETECTED NOT DETECTED Final   Pseudomonas aeruginosa NOT DETECTED NOT DETECTED Final   Candida albicans NOT DETECTED NOT DETECTED Final   Candida glabrata NOT DETECTED NOT DETECTED Final   Candida krusei NOT DETECTED NOT DETECTED Final   Candida parapsilosis NOT DETECTED NOT DETECTED Final   Candida tropicalis NOT DETECTED NOT DETECTED Final    Comment: Performed at [facility], [place] [address] [place], [place] [address]  Blood culture (routine x 2)     Status: None (Preliminary result)   Collection Time: 08/02/18  2:55 PM   Specimen: BLOOD RIGHT HAND  Result Value Ref Range Status   Specimen Description BLOOD RIGHT HAND  Final   Special Requests   Final    BOTTLES DRAWN AEROBIC AND ANAEROBIC Blood Culture results may not be optimal due to an inadequate volume of blood received in culture bottles   Culture   Final    NO GROWTH 4 DAYS Performed at [facility], [place] [address] [place], [place] [address]    Report Status PENDING  Incomplete  SARS Coronavirus 2 (CEPHEID - Performed in [place] hospital lab), Hosp Order     Status: None   Collection Time: 08/02/18  6:23 PM   Specimen: Nasopharyngeal Swab  Result Value Ref Range Status   SARS Coronavirus 2 NEGATIVE NEGATIVE Final    Comment: (NOTE) If result is NEGATIVE SARS-CoV-2 target nucleic acids are NOT DETECTED. The SARS-CoV-2 RNA is generally detectable in upper and lower  respiratory specimens during  the acute phase of infection. The lowest  concentration of SARS-CoV-2 viral copies this assay can detect is 250  copies / mL. A negative result does not preclude SARS-CoV-2 infection  and should not be used as the sole basis for treatment or other  patient management decisions.  A negative result may occur with  improper specimen collection / handling, submission of specimen other  than nasopharyngeal swab, presence of viral mutation(s) within the  areas targeted by this assay, and inadequate number of viral copies  (<250 copies / mL). A negative result must be combined with clinical  observations, patient history, and epidemiological information. If result is POSITIVE SARS-CoV-2 target nucleic acids are DETECTED. The SARS-CoV-2 RNA is generally detectable in upper and lower  respiratory specimens dur ing the acute phase of infection.  Positive  results are indicative of active infection with SARS-CoV-2.  Clinical  correlation with patient history and other diagnostic information is  necessary to determine patient infection status.  Positive results do  not rule out bacterial infection or co-infection with other viruses. If result is PRESUMPTIVE POSTIVE SARS-CoV-2 nucleic acids MAY BE PRESENT.   A presumptive positive result was obtained on the submitted specimen  and confirmed on repeat testing.  While 2019 novel coronavirus  (SARS-CoV-2) nucleic acids may be present in the submitted sample  additional confirmatory testing may be necessary for epidemiological  and / or clinical management purposes  to differentiate between  SARS-CoV-2 and other Sarbecovirus currently known to infect humans.  If clinically indicated additional testing with an alternate test  methodology [phone]) is advised. The SARS-CoV-2 RNA is generally  detectable in upper and lower respiratory sp ecimens during the acute  phase of infection. The expected result is Negative. Fact Sheet for Patients:   StrictlyIdeas.no Fact Sheet for Healthcare Providers: BankingDealers.co.za This test is not yet approved or cleared by the [place] FDA and has been authorized for detection and/or diagnosis of SARS-CoV-2 by FDA under an Emergency Use Authorization (EUA).  This EUA will remain in effect (meaning this test can be used) for the duration of the COVID-19 declaration under Section 564(b)(1) of the Act, 21 U.S.C. section 360bbb-3(b)(1), unless the authorization is terminated or revoked sooner. Performed at [facility], [place] [address], [place],  [address]   Urine culture     Status: Abnormal   Collection Time: 08/03/18  3:15 AM  Specimen: In/Out Cath Urine  Result Value Ref Range Status   Specimen Description IN/OUT CATH URINE  Final   Special Requests   Final    NONE Performed at [facility], [address], [place], [place] [address]    Culture (A)  Final    200 COLONIES/mL ESCHERICHIA COLI 100 COLONIES/mL STAPHYLOCOCCUS EPIDERMIDIS    Report Status 08/06/2018 FINAL  Final   Organism ID, Bacteria ESCHERICHIA COLI (A)  Final   Organism ID, Bacteria STAPHYLOCOCCUS EPIDERMIDIS (A)  Final      Susceptibility   Escherichia coli - MIC*    AMPICILLIN >=32 RESISTANT Resistant     CEFAZOLIN <=4 SENSITIVE Sensitive     CEFTRIAXONE <=1 SENSITIVE Sensitive     CIPROFLOXACIN <=0.25 SENSITIVE Sensitive     GENTAMICIN <=1 SENSITIVE Sensitive     IMIPENEM <=0.25 SENSITIVE Sensitive     NITROFURANTOIN 32 SENSITIVE Sensitive     TRIMETH/SULFA <=20 SENSITIVE Sensitive     AMPICILLIN/SULBACTAM >=32 RESISTANT Resistant     PIP/TAZO <=4 SENSITIVE Sensitive     Extended ESBL NEGATIVE Sensitive     * 200 COLONIES/mL ESCHERICHIA COLI   Staphylococcus epidermidis - MIC*    CIPROFLOXACIN <=0.5 SENSITIVE Sensitive     GENTAMICIN <=0.5 SENSITIVE Sensitive     NITROFURANTOIN <=16 SENSITIVE Sensitive     OXACILLIN <=0.25 SENSITIVE  Sensitive     TETRACYCLINE <=1 SENSITIVE Sensitive     VANCOMYCIN 1 SENSITIVE Sensitive     TRIMETH/SULFA <=10 SENSITIVE Sensitive     CLINDAMYCIN <=0.25 SENSITIVE Sensitive     RIFAMPIN <=0.5 SENSITIVE Sensitive     Inducible Clindamycin NEGATIVE Sensitive     * 100 COLONIES/mL STAPHYLOCOCCUS EPIDERMIDIS  Culture, Urine     Status: None   Collection Time: 08/04/18  9:39 PM   Specimen: Urine, Clean Catch  Result Value Ref Range Status   Specimen Description URINE, CLEAN CATCH  Final   Special Requests NONE  Final   Culture   Final    NO GROWTH Performed at [facility], [address], [place], [place] [address]    Report Status 08/06/2018 FINAL  Final      Radiology Studies: No results found.      Scheduled Meds: . enoxaparin (LOVENOX) injection  40 mg Subcutaneous Q24H  . folic acid  1 mg Oral Daily  . LORazepam  0.5 mg Oral QHS  . multivitamin with minerals  1 tablet Oral Daily  . pantoprazole  40 mg Oral Daily  . thiamine  100 mg Oral Daily   Continuous Infusions: . sodium chloride 250 mL (08/06/18 1232)  . ceFEPime (MAXIPIME) IV 2 g (08/07/18 0415)    Assessment & Plan:    1.  Provoked seizure with postictal altered mental status: Felt to have hypoglycemia or medication induced seizure given findings of urinary incontinence and tongue bite.  Seen by neurology who spoke to patient's mother who reported that patient was abusing tramadol.  Avoid tramadol/retalin/alcohol use.  Urine drug screen negative on admission.  Blood glucose now normalized.  Patient not started on antiepileptics given isolated witnessed event. CT head/MRI brain and EEG within normal limits. Will need neurology follow-up as outpatient.  No driving restrictions advised by neurology for now.    2.  Leukocytosis/bacteremia: Blood culture growing 1 out of 2 bottles gram-negative rods from July 14.  Could be contamination.  Urine culture showed insignificant growth.  Repeat blood cultures  results pending and repeat urine cultures no  growth so far .  Remains afebrile and leukocytosis has now resolved.  She has local swelling at the site of previous IV site and is on empiric cefepime,will transition to oral abx and monitor. Cold compresses for infiltrate site/phlebitis  3.  History of migraines:She had seen Dr. Posey Pronto at [facility] neurology at some point but did not follow-up.  She is not on any prophylactic medications for migraine anymore.  According to neurology note, she was tried on multiple medications by Dr. Posey Pronto: Depakote, propranolol, topiramate, verapamil, zonisamide, Zoloft, Celexa, Lexapro, Cymbalta, gabapentin, Seroquel, triptans and tizanidine but not tried Botox or could not remember if she has tried nortriptyline or amitriptyline.  She had tried sphenopalatine block without relief.She never had complex migraine-like symptoms.Can f/u primary neurolgist upon discharge  4.  Alcohol use: Denies prior withdrawals requiring hospitalization.  Continue thiamine/multivitamin/folate  5.  ADHD: Retalin held for now  6.  Endometriosis: Follow-up primary GYN upon discharge.  No acute issues currently  7.  GERD: PPI  DVT prophylaxis: Lovenox Code Status: Full code Family / Patient Communication: Discussed with patient and d/w her mother on the phone Disposition Plan: Home when medically cleared, possibly in am     LOS: 4 days    Time spent: 35 minutes    Guilford Shi, MD [facility] Pager [phone]  If 7PM-7AM, please contact night-coverage [email or web address] Password [facility] 08/07/2018, 8:35 AM

## 2018-08-08 ENCOUNTER — Inpatient Hospital Stay (HOSPITAL_COMMUNITY): Payer: BC Managed Care – PPO

## 2018-08-08 DIAGNOSIS — F419 Anxiety disorder, unspecified: Secondary | ICD-10-CM

## 2018-08-08 DIAGNOSIS — I809 Phlebitis and thrombophlebitis of unspecified site: Secondary | ICD-10-CM

## 2018-08-08 DIAGNOSIS — M12811 Other specific arthropathies, not elsewhere classified, right shoulder: Secondary | ICD-10-CM

## 2018-08-08 DIAGNOSIS — G894 Chronic pain syndrome: Secondary | ICD-10-CM

## 2018-08-08 LAB — GLUCOSE, CAPILLARY
Glucose-Capillary: 100 mg/dL — ABNORMAL HIGH (ref 70–99)
Glucose-Capillary: 109 mg/dL — ABNORMAL HIGH (ref 70–99)
Glucose-Capillary: 119 mg/dL — ABNORMAL HIGH (ref 70–99)
Glucose-Capillary: 82 mg/dL (ref 70–99)
Glucose-Capillary: 91 mg/dL (ref 70–99)

## 2018-08-08 LAB — CULTURE, BLOOD (ROUTINE X 2): Special Requests: ADEQUATE

## 2018-08-08 MED ORDER — IOHEXOL 300 MG/ML  SOLN
75.0000 mL | Freq: Once | INTRAMUSCULAR | Status: AC | PRN
  Administered 2018-08-08: 75 mL via INTRAVENOUS

## 2018-08-08 MED ORDER — AMOXICILLIN 500 MG PO CAPS: 500.0000 mg | ORAL_CAPSULE | Freq: Three times a day (TID) | ORAL | 0 refills | Status: AC

## 2018-08-08 MED ORDER — ASPIRIN EC 325 MG PO TBEC: 325.0000 mg | DELAYED_RELEASE_TABLET | Freq: Every day | ORAL | 0 refills | Status: AC

## 2018-08-08 MED ORDER — CEFDINIR 300 MG PO CAPS: 300.0000 mg | ORAL_CAPSULE | Freq: Two times a day (BID) | ORAL | 0 refills | Status: DC

## 2018-08-08 MED ORDER — HYDROCODONE-ACETAMINOPHEN 5-325 MG PO TABS: 2.0000 | ORAL_TABLET | Freq: Four times a day (QID) | ORAL | 0 refills | Status: DC | PRN

## 2018-08-08 MED ORDER — FOLIC ACID 1 MG PO TABS: 1.0000 mg | ORAL_TABLET | Freq: Every day | ORAL | 1 refills | Status: DC

## 2018-08-08 MED ORDER — PANTOPRAZOLE SODIUM 20 MG PO TBEC: 40.0000 mg | DELAYED_RELEASE_TABLET | Freq: Every day | ORAL | 0 refills | Status: DC

## 2018-08-08 MED ORDER — ADULT MULTIVITAMIN W/MINERALS CH: 1.0000 | ORAL_TABLET | Freq: Every day | ORAL | 1 refills | Status: AC

## 2018-08-08 MED ORDER — AMOXICILLIN 500 MG PO CAPS
500.0000 mg | ORAL_CAPSULE | Freq: Three times a day (TID) | ORAL | Status: DC
  Filled 2018-08-08: qty 1

## 2018-08-08 MED ORDER — THIAMINE HCL 100 MG PO TABS: 100.0000 mg | ORAL_TABLET | Freq: Every day | ORAL | 1 refills | Status: DC

## 2018-08-08 NOTE — Evaluation (Signed)
Physical Therapy Evaluation Patient Details Name: Laura Simmons MRN: [id number] DOB: Nov 28, 1975 Today's Date: 08/08/2018   History of Present Illness  Patient is a 43 y/o female presenting with Unresponsiveness on 08/02/2018. Past medical history of migraine, high platelet count, chronic pain, bipolar disorder, agoraphobia, and ADHD. Normal noncontrast CT. Admitted for further work-up.   Clinical Impression  Patient admitted with the above. Reports independence at baseline. Patient today ambulating in hallway and navigating up/down 10 steps without LOB or instability. MD requesting sling for R UE for patient comfort - PT ordering this. No further skilled PT needs identified. PT to sign off.      Follow Up Recommendations No PT follow up    Equipment Recommendations  None recommended by PT    Recommendations for Other Services       Precautions / Restrictions Precautions Precautions: Fall Restrictions Weight Bearing Restrictions: No Other Position/Activity Restrictions: MD requesting sling for R UE due to known tear for comfort      Mobility  Bed Mobility Overal bed mobility: Independent                Transfers Overall transfer level: Independent                  Ambulation/Gait Ambulation/Gait assistance: Supervision Gait Distance (Feet): 200 Feet Assistive device: None Gait Pattern/deviations: Step-through pattern Gait velocity: WNL   General Gait Details: normal gait pattern; no LOB  Stairs Stairs: Yes Stairs assistance: Supervision Stair Management: No rails;Alternating pattern;Forwards Number of Stairs: 10    Wheelchair Mobility    Modified Rankin (Stroke Patients Only)       Balance Overall balance assessment: Modified Independent                                           Pertinent Vitals/Pain Pain Assessment: Faces Faces Pain Scale: Hurts even more Pain Location: R shoulder Pain Descriptors / Indicators:  Aching;Discomfort;Grimacing;Guarding Pain Intervention(s): Limited activity within patient's tolerance;Monitored during session;Repositioned;Patient requesting pain meds-RN notified    Home Living Family/patient expects to be discharged to:: Private residence Living Arrangements: Parent Available Help at Discharge: Family;Available PRN/intermittently Type of Home: House Home Access: Level entry     Home Layout: One level Home Equipment: None      Prior Function Level of Independence: Independent               Hand Dominance        Extremity/Trunk Assessment   Upper Extremity Assessment Upper Extremity Assessment: Defer to OT evaluation    Lower Extremity Assessment Lower Extremity Assessment: Overall WFL for tasks assessed    Cervical / Trunk Assessment Cervical / Trunk Assessment: Normal  Communication   Communication: No difficulties  Cognition Arousal/Alertness: Awake/alert Behavior During Therapy: WFL for tasks assessed/performed Overall Cognitive Status: Within Functional Limits for tasks assessed                                        General Comments General comments (skin integrity, edema, etc.): patient holding R UE in protective pattern to prevent pain    Exercises     Assessment/Plan    PT Assessment Patent does not need any further PT services  PT Problem List         PT Treatment Interventions  PT Goals (Current goals can be found in the Care Plan section)  Acute Rehab PT Goals Patient Stated Goal: go home today PT Goal Formulation: All assessment and education complete, DC therapy    Frequency     Barriers to discharge        Co-evaluation               AM-PAC PT "6 Clicks" Mobility  Outcome Measure Help needed turning from your back to your side while in a flat bed without using bedrails?: None Help needed moving from lying on your back to sitting on the side of a flat bed without using bedrails?:  None Help needed moving to and from a bed to a chair (including a wheelchair)?: None Help needed standing up from a chair using your arms (e.g., wheelchair or bedside chair)?: None Help needed to walk in hospital room?: A Little Help needed climbing 3-5 steps with a railing? : A Little 6 Click Score: 22    End of Session   Activity Tolerance: Patient tolerated treatment well Patient left: in bed;with call bell/phone within reach;with nursing/sitter in room Nurse Communication: Mobility status PT Visit Diagnosis: Unsteadiness on feet (R26.81)    Time: 1000-1015 PT Time Calculation (min) (ACUTE ONLY): 15 min   Charges:   PT Evaluation $PT Eval Low Complexity: 1 Low           Lanney Gins, PT, DPT Supplemental Physical Therapist 08/08/18 10:36 AM Pager: [phone] Office: [phone]

## 2018-08-08 NOTE — Discharge Summary (Addendum)
Physician Discharge Summary  Laura Simmons NFA:[id number] DOB: [birth year] DOA: 08/02/2018  PCP: Shelda Pal, DO  Admit date: 08/02/2018 Discharge date: 08/08/2018 Consultations:  Neurology Admitted From: home Disposition: home  Discharge Diagnoses:  Principal Problem:   Anxiety Active Problems:   Chronic pain disorder   Bipolar disorder with depression ([place])   Mood disorder ([place])   Hypoglycemia   Metabolic acidosis   Seizures [facility])   Hospital Course Summary: 43 year old female with history of endometriosis and chronic alcohol use who lives with her parents was brought in after being found unresponsive, incontinent and staring to the left side.  She was found to have blood glucose of 45 on EMS evaluation and responded to D10/glucagon.She reported few other episodes of unexplained transient loss of awareness in the recent past.In the ED the patient was found to have glucose of 141, lactic acid of 4.7, WBC of 20.4, and anion gap of 18. Ethyl alcohol was less than 10 (states she consumes liquor ~3 times a month 3-4 drinks). Urine drug screen and CT head were unremarkable. CXR showed no acute abnormality.  Patient also evaluated by neurology and EEG was within normal limits.  Neurology felt patient had hypoglycemia related versus medication induced (Ritalin/tramadol overuse) seizure.  Hospital course has been complicated by leukocytosis/positive blood cultures for which she was receiving empiric antibiotics.  Repeat blood cultures sent on July 18 negative so far and initial culture results felt to be contaminant.  1.  Provoked seizure with postictal altered mental status: Felt to have hypoglycemia or medication induced seizure given findings of urinary incontinence and tongue bite.  Seen by neurology who spoke to patient's mother and apparently mother reported that patient was overusing tramadol for shoulder pain.  Avoid tramadol/retalin/alcohol use.  Urine drug screen negative on  admission.  Blood glucose now normalized.  Patient not started on antiepileptics given isolated witnessed event. CT head/MRI brain and EEG within normal limits. Will need neurology follow-up as outpatient.  No driving restrictions advised by neurology for now.  Given blood culture ID  indicating Fusobacterium nucleatum, possible head and neck source was suspected.  Although, patient does have chronic right shoulder pain due to rotator cuff tear, she denied any neck pain/swelling.  She does have limited range of motion along this joint and was scheduled for surgery today.  She has been using heating pads for pain while here.  In this scenario, CT soft tissue neck with contrast was obtained after speaking to radiologist to rule out infection as well as jugular thrombosis/Lemierre's which could possibly contribute to bacteremia.  There was no evidence of infection on CT, patient does not have any dental infection either.  CT raised the possibility of subclavian blood flow limitation but could not confirm thrombosis.  Discussed with Dr. Donzetta Matters (vascular surgery) and obtained upper extremity Dopplers which revealed superficial thrombosis in right basilic vein but no evidence of deep vein/subclavian thrombosis.  Vascular recommended aspirin for 1 week and repeat duplex upper extremity if there is any concern for swelling or worsening pain.  Also discussed with ID, Dr. Linus Salmons, who recommended amoxicillin course for 1 week.  Patient should follow-up with PCP regarding final report on repeat blood cultures from July 18.  If negative, no further work-up needed.  2.  Leukocytosis/bacteremia:Urine culture from admission showed insignificant growth. Blood culture grew 1 out of 2 bottles gram-negative rods from July 14, identified as fusobacterium this morning. Repeat blood cultures from 7/18 so far negative and repeat urine cultures no growth  so far .  Remains afebrile and leukocytosis has now resolved.  Patient was initially  started on empiric antibiotics ( cefepime) for positive blood cultures and transitioned to oral ceftidinir yesterday in anticipation of discharge today.  Patient did have infiltrated IV and complained of pain at old IV site along the left hand and right arm.   3.  History of migraines:She had seen Dr. Posey Pronto at [facility] neurology at some point but did not follow-up. She is not on any prophylactic medications for migraine anymore. According to neurology note, she was tried on multiple medications by Dr. Posey Pronto: Depakote, propranolol, topiramate, verapamil, zonisamide, Zoloft, Celexa, Lexapro, Cymbalta, gabapentin, Seroquel, triptans and tizanidine but not tried Botox or could not remember if she has tried nortriptyline or amitriptyline. She had tried sphenopalatine block without relief.She never had complex migraine-like symptoms.Can f/u primary neurolgist upon discharge and to avoid BC powders that she was taking prior to admission  4.  Alcohol use: Denies prior withdrawals requiring hospitalization.  Continue thiamine/multivitamin/folate  5.  Right shoulder pain/rotator cuff injury: Patient follows orthopedics as outpatient, was issued a sling and was scheduled for surgery today but now postponed due to hospitalization. Patient was taking Tramadol/BC powders as outpatient which we are advising to hold. Precsribe Norco for short term (5 days) until PCP and orthopedics f/u. She should follow-up with PCP regarding final blood culture report as mentioned above and re- schedule surgery.  6.  Endometriosis: Follow-up primary GYN upon discharge.  No acute issues currently  7.  GERD: PPI. Avoid BC powders  8.  ADHD: Retalin held for now in concern for #1. F/U PCP, primary psychiatrist upon discharge  9. ?Hyperlipidemia: Stopped taking statins. F/U PCP  Discharge Exam:  Vitals:   08/08/18 0422 08/08/18 1141  BP: 128/84 (!) 143/93  Pulse: (!) 59 60  Resp: 17 16  Temp: 98.3 F (36.8 C) 98.8 F (37.1  C)  SpO2: 100% 100%   Vitals:   08/07/18 1202 08/07/18 1955 08/08/18 0422 08/08/18 1141  BP: 131/80 114/79 128/84 (!) 143/93  Pulse: 61 64 (!) 59 60  Resp: 20 18 17 16   Temp: 98.4 F (36.9 C) 98.6 F (37 C) 98.3 F (36.8 C) 98.8 F (37.1 C)  TempSrc: Oral Oral Oral Oral  SpO2: 100% 99% 100% 100%  Weight:   49.7 kg   Height:        General: Pt is alert, awake, not in acute distress Cardiovascular: RRR, S1/S2 +, no rubs, no gallops Respiratory: CTA bilaterally, no wheezing, no rhonchi Abdominal: Soft, NT, ND, bowel sounds + Extremities: no edema, no cyanosis, decreased ROM along rt shoulder  Discharge Condition:Stable CODE STATUS: Full code Diet recommendation: Regular diet Recommendations for Outpatient Follow-up:  1. Follow up with PCP: 5 days 2. Follow up with consultants:  Primary neurologist Dr Posey Pronto, primary psychiatrist and orthopedics in 2 weeks 3. Please obtain follow up labs including: Lipid profile, final report regarding repeat blood cultures from July 18   Discharge Instructions:  Discharge Instructions    Call MD for:  extreme fatigue   Complete by: As directed    Call MD for:  persistant dizziness or light-headedness   Complete by: As directed    Call MD for:  persistant nausea and vomiting   Complete by: As directed    Call MD for:  redness, tenderness, or signs of infection (pain, swelling, redness, odor or green/yellow discharge around incision site)   Complete by: As directed    Call MD  for:  severe uncontrolled pain   Complete by: As directed    Call MD for:  temperature >100.4   Complete by: As directed    Diet - low sodium heart healthy   Complete by: As directed    Discharge instructions   Complete by: As directed    Follow up with PCP in 5 days , Primary Orthopedics as re-scheduled for shoulder surgery, Primary Neurology Dr patel in 2 weeks   Increase activity slowly   Complete by: As directed      Allergies as of 08/08/2018       Reactions   Prochlorperazine Other (See Comments)   lockjaw Other reaction(s): Other   Sumatriptan Succinate Anaphylaxis, Other (See Comments)   ALL TRIPTAN MEDS   Triptans Anaphylaxis, Other (See Comments)   Pt states throat feels as if it is closing.   Ketamine Other (See Comments)   "Pt. Reports she never wants again." major hallucinations   Reglan [metoclopramide] Other (See Comments)   "crawling out of my skin"      Medication List    STOP taking these medications   BC HEADACHE POWDER PO   methylphenidate 10 MG tablet Commonly known as: RITALIN   pravastatin 40 MG tablet Commonly known as: PRAVACHOL   traMADol 50 MG tablet Commonly known as: ULTRAM   zolpidem 10 MG tablet Commonly known as: AMBIEN     TAKE these medications   amoxicillin 500 MG capsule Commonly known as: AMOXIL Take 1 capsule (500 mg total) by mouth 3 (three) times daily for 7 days.   aspirin EC 325 MG tablet Take 1 tablet (325 mg total) by mouth daily for 10 days.   clonazePAM 1 MG tablet Commonly known as: KLONOPIN TAKE ONE-HALF TO ONE TABLET BY MOUTH TWICE DAILY AS NEEDED FOR ANXIETY What changed:   how much to take  how to take this  when to take this  reasons to take this  additional instructions   dimenhyDRINATE 50 MG tablet Commonly known as: DRAMAMINE Take 50 mg by mouth at bedtime as needed for nausea.   docusate sodium 100 MG capsule Commonly known as: Colace Take 1 capsule (100 mg total) by mouth 2 (two) times daily. What changed:   when to take this  reasons to take this   folic acid 1 MG tablet Commonly known as: FOLVITE Take 1 tablet (1 mg total) by mouth daily.   HYDROcodone-acetaminophen 5-325 MG tablet Commonly known as: NORCO/VICODIN Take 2 tablets by mouth every 6 (six) hours as needed for up to 5 days for severe pain.   multivitamin with minerals Tabs tablet Take 1 tablet by mouth daily.   ondansetron 4 MG tablet Commonly known as: ZOFRAN Take 1  tablet (4 mg total) by mouth 2 (two) times daily as needed for nausea or vomiting.   pantoprazole 20 MG tablet Commonly known as: PROTONIX Take 2 tablets (40 mg total) by mouth daily.   thiamine 100 MG tablet Take 1 tablet (100 mg total) by mouth daily.   Tums 500 MG chewable tablet Generic drug: calcium carbonate Chew 1 tablet by mouth as needed for indigestion or heartburn.       Allergies  Allergen Reactions  . Prochlorperazine Other (See Comments)    lockjaw Other reaction(s): Other  . Sumatriptan Succinate Anaphylaxis and Other (See Comments)    ALL TRIPTAN MEDS  . Triptans Anaphylaxis and Other (See Comments)    Pt states throat feels as if it is closing.  Marland Kitchen  Ketamine Other (See Comments)    "Pt. Reports she never wants again." major hallucinations  . Reglan [Metoclopramide] Other (See Comments)    "crawling out of my skin"      The results of significant diagnostics from this hospitalization (including imaging, microbiology, ancillary and laboratory) are listed below for reference.    Labs: BNP (last 3 results) No results for input(s): BNP in the last 8760 hours. Basic Metabolic Panel: Recent Labs  Lab 08/02/18 1428 08/03/18 0323 08/03/18 1144 08/04/18 0419 08/05/18 0616  NA 140 138 136 141 138  K 3.8 3.8 3.5 3.7 3.5  CL 109 113* 111 112* 109  CO2 13* 15* 19* 24 24  GLUCOSE 156* 117* 160* 84 87  BUN 19 14 12  <5* 5*  CREATININE 0.92 1.00 0.75 0.73 0.65  CALCIUM 9.1 8.1* 8.5* 8.6* 8.5*   Liver Function Tests: Recent Labs  Lab 08/02/18 1428 08/03/18 0323 08/03/18 1144  AST 26 27 24   ALT 15 19 19   ALKPHOS 44 35* 36*  BILITOT 0.5 0.7 0.7  PROT 6.7 5.2* 5.8*  ALBUMIN 3.9 3.0* 3.0*   No results for input(s): LIPASE, AMYLASE in the last 168 hours. No results for input(s): AMMONIA in the last 168 hours. CBC: Recent Labs  Lab 08/02/18 1428 08/03/18 0323 08/05/18 0616 08/06/18 0533  WBC 20.4* 13.6* 6.0 6.5  NEUTROABS  --   --  2.9 3.1  HGB  13.4 11.8* 12.5 13.0  HCT 40.1 35.7* 36.6 37.6  MCV 96.2 96.5 93.6 92.8  PLT 421* 376 297 268   Cardiac Enzymes: No results for input(s): CKTOTAL, CKMB, CKMBINDEX, TROPONINI in the last 168 hours. BNP: Invalid input(s): POCBNP CBG: Recent Labs  Lab 08/08/18 0015 08/08/18 0421 08/08/18 0740 08/08/18 1143 08/08/18 1638  GLUCAP 91 82 109* 100* 119*   D-Dimer No results for input(s): DDIMER in the last 72 hours. Hgb A1c No results for input(s): HGBA1C in the last 72 hours. Lipid Profile No results for input(s): CHOL, HDL, LDLCALC, TRIG, CHOLHDL, LDLDIRECT in the last 72 hours. Thyroid function studies No results for input(s): TSH, T4TOTAL, T3FREE, THYROIDAB in the last 72 hours.  Invalid input(s): FREET3 Anemia work up No results for input(s): VITAMINB12, FOLATE, FERRITIN, TIBC, IRON, RETICCTPCT in the last 72 hours. Urinalysis    Component Value Date/Time   COLORURINE YELLOW 08/02/2018 2111   APPEARANCEUR HAZY (A) 08/02/2018 2111   LABSPEC 1.014 08/02/2018 2111   PHURINE 5.0 08/02/2018 2111   GLUCOSEU NEGATIVE 08/02/2018 2111   HGBUR NEGATIVE 08/02/2018 2111   BILIRUBINUR NEGATIVE 08/02/2018 2111   BILIRUBINUR n 10/07/2015 1010   KETONESUR 20 (A) 08/02/2018 2111   PROTEINUR NEGATIVE 08/02/2018 2111   UROBILINOGEN 0.2 10/07/2015 1010   UROBILINOGEN 1.0 01/19/2013 1501   NITRITE POSITIVE (A) 08/02/2018 2111   LEUKOCYTESUR NEGATIVE 08/02/2018 2111   Sepsis Labs Invalid input(s): PROCALCITONIN,  WBC,  LACTICIDVEN Microbiology Recent Results (from the past 240 hour(s))  Blood culture (routine x 2)     Status: Abnormal   Collection Time: 08/02/18  2:54 PM   Specimen: BLOOD  Result Value Ref Range Status   Specimen Description BLOOD RIGHT ANTECUBITAL  Final   Special Requests   Final    BOTTLES DRAWN AEROBIC AND ANAEROBIC Blood Culture adequate volume   Culture  Setup Time   Final    GRAM NEGATIVE RODS ANAEROBIC BOTTLE ONLY CRITICAL RESULT CALLED TO, READ BACK BY  AND VERIFIED WITH: L POWELL PHARMD 08/04/18 1829 JDW    Culture (  A)  Final    FUSOBACTERIUM NUCLEATUM BETA LACTAMASE NEGATIVE Performed at [facility], [place] [address] [place], [place] [address]    Report Status 08/08/2018 FINAL  Final  Blood Culture ID Panel (Reflexed)     Status: None   Collection Time: 08/02/18  2:54 PM  Result Value Ref Range Status   Enterococcus species NOT DETECTED NOT DETECTED Final   Listeria monocytogenes NOT DETECTED NOT DETECTED Final   Staphylococcus species NOT DETECTED NOT DETECTED Final   Staphylococcus aureus (BCID) NOT DETECTED NOT DETECTED Final   Streptococcus species NOT DETECTED NOT DETECTED Final   Streptococcus agalactiae NOT DETECTED NOT DETECTED Final   Streptococcus pneumoniae NOT DETECTED NOT DETECTED Final   Streptococcus pyogenes NOT DETECTED NOT DETECTED Final   Acinetobacter baumannii NOT DETECTED NOT DETECTED Final   Enterobacteriaceae species NOT DETECTED NOT DETECTED Final   Enterobacter cloacae complex NOT DETECTED NOT DETECTED Final   Escherichia coli NOT DETECTED NOT DETECTED Final   Klebsiella oxytoca NOT DETECTED NOT DETECTED Final   Klebsiella pneumoniae NOT DETECTED NOT DETECTED Final   Proteus species NOT DETECTED NOT DETECTED Final   Serratia marcescens NOT DETECTED NOT DETECTED Final   Haemophilus influenzae NOT DETECTED NOT DETECTED Final   Neisseria meningitidis NOT DETECTED NOT DETECTED Final   Pseudomonas aeruginosa NOT DETECTED NOT DETECTED Final   Candida albicans NOT DETECTED NOT DETECTED Final   Candida glabrata NOT DETECTED NOT DETECTED Final   Candida krusei NOT DETECTED NOT DETECTED Final   Candida parapsilosis NOT DETECTED NOT DETECTED Final   Candida tropicalis NOT DETECTED NOT DETECTED Final    Comment: Performed at [facility], [place] [address] [place], [place] [address]  Blood culture (routine x 2)     Status: None   Collection Time: 08/02/18  2:55 PM   Specimen: BLOOD RIGHT HAND  Result  Value Ref Range Status   Specimen Description BLOOD RIGHT HAND  Final   Special Requests   Final    BOTTLES DRAWN AEROBIC AND ANAEROBIC Blood Culture results may not be optimal due to an inadequate volume of blood received in culture bottles   Culture   Final    NO GROWTH 5 DAYS Performed at [facility], [place] [address], [place], [place] [address]    Report Status 08/07/2018 FINAL  Final  SARS Coronavirus 2 (CEPHEID - Performed in [place] hospital lab), Hosp Order     Status: None   Collection Time: 08/02/18  6:23 PM   Specimen: Nasopharyngeal Swab  Result Value Ref Range Status   SARS Coronavirus 2 NEGATIVE NEGATIVE Final    Comment: (NOTE) If result is NEGATIVE SARS-CoV-2 target nucleic acids are NOT DETECTED. The SARS-CoV-2 RNA is generally detectable in upper and lower  respiratory specimens during the acute phase of infection. The lowest  concentration of SARS-CoV-2 viral copies this assay can detect is 250  copies / mL. A negative result does not preclude SARS-CoV-2 infection  and should not be used as the sole basis for treatment or other  patient management decisions.  A negative result may occur with  improper specimen collection / handling, submission of specimen other  than nasopharyngeal swab, presence of viral mutation(s) within the  areas targeted by this assay, and inadequate number of viral copies  (<250 copies / mL). A negative result must be combined with clinical  observations, patient history, and epidemiological information. If result is POSITIVE SARS-CoV-2 target nucleic acids are DETECTED. The  SARS-CoV-2 RNA is generally detectable in upper and lower  respiratory specimens dur ing the acute phase of infection.  Positive  results are indicative of active infection with SARS-CoV-2.  Clinical  correlation with patient history and other diagnostic information is  necessary to determine patient infection status.  Positive results do  not rule out  bacterial infection or co-infection with other viruses. If result is PRESUMPTIVE POSTIVE SARS-CoV-2 nucleic acids MAY BE PRESENT.   A presumptive positive result was obtained on the submitted specimen  and confirmed on repeat testing.  While 2019 novel coronavirus  (SARS-CoV-2) nucleic acids may be present in the submitted sample  additional confirmatory testing may be necessary for epidemiological  and / or clinical management purposes  to differentiate between  SARS-CoV-2 and other Sarbecovirus currently known to infect humans.  If clinically indicated additional testing with an alternate test  methodology [phone]) is advised. The SARS-CoV-2 RNA is generally  detectable in upper and lower respiratory sp ecimens during the acute  phase of infection. The expected result is Negative. Fact Sheet for Patients:  StrictlyIdeas.no Fact Sheet for Healthcare Providers: BankingDealers.co.za This test is not yet approved or cleared by the [place] FDA and has been authorized for detection and/or diagnosis of SARS-CoV-2 by FDA under an Emergency Use Authorization (EUA).  This EUA will remain in effect (meaning this test can be used) for the duration of the COVID-19 declaration under Section 564(b)(1) of the Act, 21 U.S.C. section 360bbb-3(b)(1), unless the authorization is terminated or revoked sooner. Performed at [facility], [place] [address], [place], [place] [address]   Urine culture     Status: Abnormal   Collection Time: 08/03/18  3:15 AM   Specimen: In/Out Cath Urine  Result Value Ref Range Status   Specimen Description IN/OUT CATH URINE  Final   Special Requests   Final    NONE Performed at [facility], [place] [address] [place], [place] [address]    Culture (A)  Final    200 COLONIES/mL ESCHERICHIA COLI 100 COLONIES/mL STAPHYLOCOCCUS EPIDERMIDIS    Report Status 08/06/2018 FINAL  Final   Organism ID, Bacteria  ESCHERICHIA COLI (A)  Final   Organism ID, Bacteria STAPHYLOCOCCUS EPIDERMIDIS (A)  Final      Susceptibility   Escherichia coli - MIC*    AMPICILLIN >=32 RESISTANT Resistant     CEFAZOLIN <=4 SENSITIVE Sensitive     CEFTRIAXONE <=1 SENSITIVE Sensitive     CIPROFLOXACIN <=0.25 SENSITIVE Sensitive     GENTAMICIN <=1 SENSITIVE Sensitive     IMIPENEM <=0.25 SENSITIVE Sensitive     NITROFURANTOIN 32 SENSITIVE Sensitive     TRIMETH/SULFA <=20 SENSITIVE Sensitive     AMPICILLIN/SULBACTAM >=32 RESISTANT Resistant     PIP/TAZO <=4 SENSITIVE Sensitive     Extended ESBL NEGATIVE Sensitive     * 200 COLONIES/mL ESCHERICHIA COLI   Staphylococcus epidermidis - MIC*    CIPROFLOXACIN <=0.5 SENSITIVE Sensitive     GENTAMICIN <=0.5 SENSITIVE Sensitive     NITROFURANTOIN <=16 SENSITIVE Sensitive     OXACILLIN <=0.25 SENSITIVE Sensitive     TETRACYCLINE <=1 SENSITIVE Sensitive     VANCOMYCIN 1 SENSITIVE Sensitive     TRIMETH/SULFA <=10 SENSITIVE Sensitive     CLINDAMYCIN <=0.25 SENSITIVE Sensitive     RIFAMPIN <=0.5 SENSITIVE Sensitive     Inducible Clindamycin NEGATIVE Sensitive     * 100 COLONIES/mL STAPHYLOCOCCUS EPIDERMIDIS  Culture, Urine     Status: None   Collection  Time: 08/04/18  9:39 PM   Specimen: Urine, Clean Catch  Result Value Ref Range Status   Specimen Description URINE, CLEAN CATCH  Final   Special Requests NONE  Final   Culture   Final    NO GROWTH Performed at [facility], [address] [place], [place] [address]    Report Status 08/06/2018 FINAL  Final  Culture, blood (routine x 2)     Status: None (Preliminary result)   Collection Time: 08/06/18  5:28 AM   Specimen: BLOOD RIGHT HAND  Result Value Ref Range Status   Specimen Description BLOOD RIGHT HAND  Final   Special Requests   Final    AEROBIC BOTTLE ONLY Blood Culture results may not be optimal due to an inadequate volume of blood received in culture bottles   Culture   Final    NO GROWTH 2  DAYS Performed at [facility], [place] [address], [place],  [place] [address]    Report Status PENDING  Incomplete  Culture, blood (routine x 2)     Status: None (Preliminary result)   Collection Time: 08/06/18  5:32 AM   Specimen: BLOOD RIGHT WRIST  Result Value Ref Range Status   Specimen Description BLOOD RIGHT WRIST  Final   Special Requests   Final    AEROBIC BOTTLE ONLY Blood Culture results may not be optimal due to an inadequate volume of blood received in culture bottles   Culture   Final    NO GROWTH 2 DAYS Performed at [facility], [place] [address], [place], [place] [address]    Report Status PENDING  Incomplete    Procedures/Studies: Ct Head Wo Contrast  Result Date: 08/02/2018 CLINICAL DATA:  43 year old female found at 1330 hours unresponsive. EXAM: CT HEAD WITHOUT CONTRAST TECHNIQUE: Contiguous axial images were obtained from the base of the skull through the vertex without intravenous contrast. COMPARISON:  Report of head CT 07/02/2000 (no images available). FINDINGS: Brain: Normal cerebral volume. No midline shift, ventriculomegaly, mass effect, evidence of mass lesion, intracranial hemorrhage or evidence of cortically based acute infarction. Gray-white matter differentiation is within normal limits throughout the brain. Vascular: No suspicious intracranial vascular hyperdensity. Skull: Negative. Sinuses/Orbits: Visualized paranasal sinuses and mastoids are clear. i Other: Visualized orbits and scalp soft tissues are within normal limits. Trace retained secretions in the nasopharynx. IMPRESSION: Normal noncontrast CT appearance of the brain. Electronically Signed   By: Genevie Ann M.D.   On: 08/02/2018 19:17   Ct Soft Tissue Neck W Contrast  Result Date: 08/08/2018 CLINICAL DATA:  Swelling and tenderness of the right upper chest and neck being treated with antibiotics. Bacteremia. Jugular clot. EXAM: CT NECK WITH CONTRAST TECHNIQUE: Multidetector CT imaging of the neck  was performed using the standard protocol following the bolus administration of intravenous contrast. CONTRAST:  98mL OMNIPAQUE IOHEXOL 300 MG/ML  SOLN COMPARISON:  MRI 08/04/2018 FINDINGS: Pharynx and larynx: No mucosal or submucosal abnormal finding. Salivary glands: Parotid and submandibular glands are normal. Thyroid: Normal Lymph nodes: Normal bilateral neck nodes. No enlarged or low-density nodes. Vascular: Vessels in the neck are normal. No jugular thrombosis at the neck level. One could question the presence of some focal thrombus in the right subclavian vein and the upper superior vena cava an extreme inferior jugular vein, but this may simply represent flow phenomenon. Limited intracranial: Normal Visualized orbits: Normal Mastoids and visualized paranasal sinuses: Clear Skeleton: Normal Upper chest: Normal Other: None IMPRESSION: No primary neck pathology  seen. No evidence of neck infection, mass or lymphadenopathy. Question nonocclusive thrombus in the right subclavian vein, extreme inferior right jugular vein and upper superior vena cava. The finding is not absolutely certain based on the CT and could be due to mixing phenomenon. Electronically Signed   By: Nelson Chimes M.D.   On: 08/08/2018 13:47   Mr Jeri Cos TO Contrast  Result Date: 08/04/2018 CLINICAL DATA:  Found unresponsive.  Possible seizure. EXAM: MRI HEAD WITHOUT AND WITH CONTRAST TECHNIQUE: Multiplanar, multiecho pulse sequences of the brain and surrounding structures were obtained without and with intravenous contrast. CONTRAST:  5 cc Gadavist COMPARISON:  Head CT 08/02/2018 FINDINGS: Brain: The brain has a normal appearance without evidence of malformation, atrophy, old or acute small or large vessel infarction, mass lesion, hemorrhage, hydrocephalus or extra-axial collection. Mesial temporal lobes appear normal and symmetric. After contrast administration, no abnormal enhancement occurs. Vascular: Major vessels at the base of the brain  show flow. Venous sinuses appear patent. Skull and upper cervical spine: Normal. Sinuses/Orbits: Clear/normal. Other: None significant. IMPRESSION: Normal examination. No abnormality seen to explain the clinical presentation. Electronically Signed   By: Nelson Chimes M.D.   On: 08/04/2018 12:37   Dg Chest Port 1 View  Result Date: 08/02/2018 CLINICAL DATA:  Hypoglycemia and altered mental status EXAM: PORTABLE CHEST 1 VIEW COMPARISON:  11/24/2015 FINDINGS: The heart size and mediastinal contours are within normal limits. Both lungs are clear. The visualized skeletal structures are unremarkable. IMPRESSION: No acute abnormality noted. Electronically Signed   By: Inez Catalina M.D.   On: 08/02/2018 14:46   Vas [name] Upper Extremity Venous Duplex  Result Date: 08/08/2018 UPPER VENOUS STUDY  Indications: Pain Risk Factors: None identified. Limitations: Reduced range of motion. Comparison Study: No prior studies. Performing Technologist: Oliver Hum RVT  Examination Guidelines: A complete evaluation includes B-mode imaging, spectral Doppler, color Doppler, and power Doppler as needed of all accessible portions of each vessel. Bilateral testing is considered an integral part of a complete examination. Limited examinations for reoccurring indications may be performed as noted.  Right Findings: +----------+------------+---------+-----------+----------+--------------+ RIGHT     CompressiblePhasicitySpontaneousProperties   Summary     +----------+------------+---------+-----------+----------+--------------+ IJV           Full       Yes       Yes                             +----------+------------+---------+-----------+----------+--------------+ Subclavian    Full       Yes       Yes                             +----------+------------+---------+-----------+----------+--------------+ Axillary      Full       Yes       Yes                              +----------+------------+---------+-----------+----------+--------------+ Brachial      Full       Yes       Yes                             +----------+------------+---------+-----------+----------+--------------+ Radial        Full                                                 +----------+------------+---------+-----------+----------+--------------+  Ulnar         Full                                                 +----------+------------+---------+-----------+----------+--------------+ Cephalic                                            Not visualized +----------+------------+---------+-----------+----------+--------------+ Basilic       None                                      Acute      +----------+------------+---------+-----------+----------+--------------+  Left Findings: +----------+------------+---------+-----------+----------+-------+ LEFT      CompressiblePhasicitySpontaneousPropertiesSummary +----------+------------+---------+-----------+----------+-------+ Subclavian    Full       Yes       Yes                      +----------+------------+---------+-----------+----------+-------+  Summary:  Right: No evidence of deep vein thrombosis in the upper extremity. Findings consistent with acute superficial vein thrombosis involving the right basilic vein. Basilic vein thrombus located only in the median cubital fossa. Unable to visualize the cephalic vein.  Left: No evidence of thrombosis in the subclavian.  *See table(s) above for measurements and observations.    Preliminary     Time coordinating discharge: Over 30 minutes  SIGNED:   Guilford Shi, MD  [facility] 08/08/2018, 4:52 PM Pager : [phone]

## 2018-08-08 NOTE — Progress Notes (Addendum)
Right upper extremity venous duplex has been completed. Preliminary results can be found in CV Proc through chart review.  Results were given to the patient's nurse, Cai.  08/08/18 3:56 PM Laura Simmons RVT

## 2018-08-08 NOTE — Plan of Care (Signed)
Alert and oriented X4. Able to communicate needs. Pain management to the right shoulder. Do distress noted. Will monitor

## 2018-08-10 ENCOUNTER — Ambulatory Visit: Payer: BC Managed Care – PPO | Admitting: Family Medicine

## 2018-08-11 ENCOUNTER — Other Ambulatory Visit: Payer: Self-pay

## 2018-08-11 LAB — CULTURE, BLOOD (ROUTINE X 2)
Culture: NO GROWTH
Culture: NO GROWTH

## 2018-08-12 ENCOUNTER — Encounter: Payer: Self-pay | Admitting: Family Medicine

## 2018-08-12 ENCOUNTER — Ambulatory Visit (INDEPENDENT_AMBULATORY_CARE_PROVIDER_SITE_OTHER): Payer: BC Managed Care – PPO | Admitting: Family Medicine

## 2018-08-12 VITALS — BP 108/80 | HR 99 | Temp 98.6°F | Ht 64.0 in | Wt 107.0 lb

## 2018-08-12 DIAGNOSIS — R4182 Altered mental status, unspecified: Secondary | ICD-10-CM

## 2018-08-12 DIAGNOSIS — M75101 Unspecified rotator cuff tear or rupture of right shoulder, not specified as traumatic: Secondary | ICD-10-CM | POA: Diagnosis not present

## 2018-08-12 DIAGNOSIS — Z79899 Other long term (current) drug therapy: Secondary | ICD-10-CM

## 2018-08-12 MED ORDER — HYDROCODONE-ACETAMINOPHEN 5-325 MG PO TABS: 1.0000 | ORAL_TABLET | Freq: Four times a day (QID) | ORAL | 0 refills | Status: DC | PRN

## 2018-08-12 NOTE — Patient Instructions (Addendum)
Try to get the surgery as soon as possible (after Monday).  Eventually we should have a physical where we do labs.   Keep the diet clean and stay active.  Let us know if you need anything.

## 2018-08-12 NOTE — Progress Notes (Signed)
Chief Complaint  Patient presents with  . Hospitalization Follow-up    Subjective: Patient is a 43 y.o. female here for hosp f/u.   Admitted for AMS at [facility] from 7/14-7/20. Her meds are stable from that visit. Questionable seizure 2/2 polypharm. She had blood cultures done that are neg. She is finishing a course of amoxicillin. She feels better overall. Her biggest concern today is her R shoulder. She had a MRI done on 07/08/2018 that showed a full thickness tear of her supraspinatus. She was scheduled for surg twice, once the surgeon cancelled and the most recent, she was admitted to the hospital. It is causing her a good deal of pain. She was given hydrocodone in the hospital. Requesting something to help with pain until surgery. Denies fevers, CP, SOB.   ROS:  Heart: Denies chest pain  Lungs: Denies SOB  Msk: +R shoulder pain  Past Medical History:  Diagnosis Date  . ADHD (attention deficit hyperactivity disorder)   . Agoraphobia   . Bipolar 1 disorder ([place])    patient denies  . Chronic pain    just migraines per patient  . Endometriosis determined by laparoscopy   . GERD (gastroesophageal reflux disease)   . High platelet count   . Migraine   . Personality disorder ([place])   . Right adrenal mass ([place])    per ct 09-06-2015  . Right ovarian cyst   . Vaginal Pap smear, abnormal     Objective: BP 108/80 (BP Location: Left Arm, Patient Position: Sitting, Cuff Size: Normal)   Pulse 99   Temp 98.6 F (37 C) (Oral)   Ht 5\' 4"  (1.626 m)   Wt 107 lb (48.5 kg)   SpO2 98%   BMI 18.37 kg/m  General: Awake, appears stated age HEENT: MMM, EOMi Heart: RRR, no bruits, no LE edema MSK: decreased ROM of R shoulder, in sling.  Lungs: CTAB, no rales, wheezes or rhonchi. No accessory muscle use Psych: Age appropriate judgment and insight, normal affect and mood  Assessment and Plan: Tear of right supraspinatus tendon - Plan: HYDROcodone-acetaminopen (NORCO/VICODIN) 5-325 MG tablet, from  my end, she is very low risk for CV complications from rotator cuff repair. She finishes amox on Mon, after that should be good for surgery.   Polypharmacy - Plan: appreciate psychiatry and inpatient team  Altered mental status, unspecified altered mental status type - Plan: resolved  Orders as above. Blood cultures show no growth. We should follow up for CPE in around 3 mo. I think we can check labs then. She has lost around 20 lbs since I saw her last, I think that her cholesterol may be a little better, but suspect a genetic component.  The patient voiced understanding and agreement to the plan.  [place], DO 08/13/18  11:00 AM

## 2018-08-13 DIAGNOSIS — M75101 Unspecified rotator cuff tear or rupture of right shoulder, not specified as traumatic: Secondary | ICD-10-CM | POA: Insufficient documentation

## 2018-08-29 DIAGNOSIS — M19011 Primary osteoarthritis, right shoulder: Secondary | ICD-10-CM | POA: Diagnosis not present

## 2018-08-29 DIAGNOSIS — M659 Synovitis and tenosynovitis, unspecified: Secondary | ICD-10-CM | POA: Diagnosis not present

## 2018-08-29 DIAGNOSIS — S46011A Strain of muscle(s) and tendon(s) of the rotator cuff of right shoulder, initial encounter: Secondary | ICD-10-CM | POA: Diagnosis not present

## 2018-08-29 DIAGNOSIS — Y999 Unspecified external cause status: Secondary | ICD-10-CM | POA: Diagnosis not present

## 2018-08-29 DIAGNOSIS — X58XXXA Exposure to other specified factors, initial encounter: Secondary | ICD-10-CM | POA: Diagnosis not present

## 2018-08-29 DIAGNOSIS — M948X1 Other specified disorders of cartilage, shoulder: Secondary | ICD-10-CM | POA: Diagnosis not present

## 2018-08-29 DIAGNOSIS — M25711 Osteophyte, right shoulder: Secondary | ICD-10-CM | POA: Diagnosis not present

## 2018-08-29 DIAGNOSIS — G8918 Other acute postprocedural pain: Secondary | ICD-10-CM | POA: Diagnosis not present

## 2018-10-12 ENCOUNTER — Ambulatory Visit (INDEPENDENT_AMBULATORY_CARE_PROVIDER_SITE_OTHER): Payer: BC Managed Care – PPO | Admitting: Psychiatry

## 2018-10-12 ENCOUNTER — Other Ambulatory Visit: Payer: Self-pay

## 2018-10-12 VITALS — BP 115/81 | HR 61

## 2018-10-12 DIAGNOSIS — F99 Mental disorder, not otherwise specified: Secondary | ICD-10-CM | POA: Diagnosis not present

## 2018-10-12 DIAGNOSIS — F902 Attention-deficit hyperactivity disorder, combined type: Secondary | ICD-10-CM

## 2018-10-12 DIAGNOSIS — F5105 Insomnia due to other mental disorder: Secondary | ICD-10-CM | POA: Diagnosis not present

## 2018-10-12 DIAGNOSIS — F4310 Post-traumatic stress disorder, unspecified: Secondary | ICD-10-CM

## 2018-10-12 MED ORDER — CLONAZEPAM 1 MG PO TABS: ORAL_TABLET | ORAL | 2 refills | Status: DC

## 2018-10-12 MED ORDER — VORTIOXETINE HBR 5 MG PO TABS: ORAL_TABLET | ORAL | 0 refills | Status: DC

## 2018-10-12 MED ORDER — ZOLPIDEM TARTRATE 5 MG PO TABS: 5.0000 mg | ORAL_TABLET | Freq: Every evening | ORAL | 2 refills | Status: DC | PRN

## 2018-10-12 NOTE — Progress Notes (Signed)
Laura Simmons [id number] 05-11-1975 43 y.o.  Subjective:   Patient ID:  Laura Simmons is a 44 y.o. (DOB 02/18/1975) female.  Chief Complaint:  Chief Complaint  Patient presents with  . Follow-up    Anxiety, insomnia, ADD    HPI Laura Simmons presents to the office today for follow-up of depression, anxiety, insomnia, and ADD.  She reports that she has had periods of dizziness. Pt reports that she was found unresponsive and had glucose level of 45. She was then hospitalized and reports that this was stressful for her. Reports that her mood has been more depressed due to limited mobility and independence. Denies irritability. She reports that she has been more withdrawn recently. She reports that her energy and motivation have been lower since shoulder surgery and having challenges with decreased mobility. "There's nothing for me to do." She reports adequate sleep now after having some difficulty after surgery. She reports that her appetite has been ok. Reports concentration is difficult at times and thoughts will wander. Denies SI.   She reports that she has had some anxious thoughts.   She reports that she had rotator cuff repair in August. Reports that both of her parents continue to have health issues.   Reports that she took last pain medication 5 days ago. Reports that she is taking Methylphenidate and rarely taking Ambien.   Past Psychiatric Medication Trials: Zyprexa- worsening depression Trileptal- benefits did not outweigh risks Ritalin Remeron Zoloft Celexa Lexapro Seroquel- RLS Depakote Gabapentin Propranolol-fatigue Topamax Trazodone- excessive daytime somnolence Klonopin Xanax Ambien  Review of Systems:  Review of Systems  Medications: I have reviewed the patient's current medications.  Current Outpatient Medications  Medication Sig Dispense Refill  . calcium carbonate (TUMS) 500 MG chewable tablet Chew 1 tablet by mouth as needed for indigestion or  heartburn.    Derrill Memo ON 11/02/2018] clonazePAM (KLONOPIN) 1 MG tablet TAKE ONE-HALF TO ONE TABLET BY MOUTH TWICE DAILY AS NEEDED FOR ANXIETY 60 tablet 2  . dimenhyDRINATE (DRAMAMINE) 50 MG tablet Take 50 mg by mouth at bedtime as needed for nausea.    Marland Kitchen docusate sodium (COLACE) 100 MG capsule Take 1 capsule (100 mg total) by mouth 2 (two) times daily. (Patient taking differently: Take 100 mg by mouth daily as needed for mild constipation. ) 30 capsule 2  . folic acid (FOLVITE) 1 MG tablet Take 1 tablet (1 mg total) by mouth daily. 30 tablet 1  . Multiple Vitamin (MULTIVITAMIN WITH MINERALS) TABS tablet Take 1 tablet by mouth daily. 30 tablet 1  . ondansetron (ZOFRAN) 4 MG tablet Take 1 tablet (4 mg total) by mouth 2 (two) times daily as needed for nausea or vomiting. 60 tablet 0  . HYDROcodone-acetaminophen (NORCO/VICODIN) 5-325 MG tablet Take 1-2 tablets by mouth every 6 (six) hours as needed for severe pain. (Patient not taking: Reported on 10/12/2018) 21 tablet 0  . pantoprazole (PROTONIX) 20 MG tablet Take 2 tablets (40 mg total) by mouth daily. 60 tablet 0  . thiamine 100 MG tablet Take 1 tablet (100 mg total) by mouth daily. (Patient not taking: Reported on 10/12/2018) 30 tablet 1  . vortioxetine HBr (TRINTELLIX) 5 MG TABS tablet Take 1 tablet (5 mg total) by mouth daily for 7 days, THEN 2 tablets (10 mg total) daily for 28 days. 30 tablet 0  . [START ON 11/02/2018] zolpidem (AMBIEN) 5 MG tablet Take 1 tablet (5 mg total) by mouth at bedtime as needed for sleep. 30 tablet 2  No current facility-administered medications for this visit.     Medication Side Effects: None  Allergies:  Allergies  Allergen Reactions  . Prochlorperazine Other (See Comments)    lockjaw Other reaction(s): Other  . Sumatriptan Succinate Anaphylaxis and Other (See Comments)    ALL TRIPTAN MEDS  . Triptans Anaphylaxis and Other (See Comments)    Pt states throat feels as if it is closing.  Marland Kitchen Ketamine Other  (See Comments)    "Pt. Reports she never wants again." major hallucinations  . Reglan [Metoclopramide] Other (See Comments)    "crawling out of my skin"    Past Medical History:  Diagnosis Date  . ADHD (attention deficit hyperactivity disorder)   . Agoraphobia   . Bipolar 1 disorder ([place])    patient denies  . Chronic pain    just migraines per patient  . Endometriosis determined by laparoscopy   . GERD (gastroesophageal reflux disease)   . High platelet count   . Migraine   . Personality disorder ([place])   . Right adrenal mass ([place])    per ct 09-06-2015  . Right ovarian cyst   . Vaginal Pap smear, abnormal     Family History  Problem Relation Age of Onset  . Migraines Mother   . Anxiety disorder Mother   . Diabetes Maternal Grandmother   . Stroke Maternal Grandfather   . Heart attack Paternal Grandfather   . Anxiety disorder Paternal Aunt   . Post-traumatic stress disorder Cousin        After [place]    Social History   Socioeconomic History  . Marital status: Single    Spouse name: Not on file  . Number of children: Not on file  . Years of education: Not on file  . Highest education level: Not on file  Occupational History  . Not on file  Social Needs  . Financial resource strain: Not on file  . Food insecurity    Worry: Not on file    Inability: Not on file  . Transportation needs    Medical: Not on file    Non-medical: Not on file  Tobacco Use  . Smoking status: Current Every Day Smoker    Packs/day: 0.50    Years: 20.00    Pack years: 10.00    Types: Cigarettes  . Smokeless tobacco: Never Used  Substance and Sexual Activity  . Alcohol use: Yes    Comment: very rare  . Drug use: No  . Sexual activity: Not Currently    Birth control/protection: None  Lifestyle  . Physical activity    Days per week: Not on file    Minutes per session: Not on file  . Stress: Not on file  Relationships  . Social Herbalist on phone: Not on file     Gets together: Not on file    Attends religious service: Not on file    Active member of club or organization: Not on file    Attends meetings of clubs or organizations: Not on file    Relationship status: Not on file  . Intimate partner violence    Fear of current or ex partner: Not on file    Emotionally abused: Not on file    Physically abused: Not on file    Forced sexual activity: Not on file  Other Topics Concern  . Not on file  Social History Narrative   Lives with parents in a one story home.  No children.  Currently not working.  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    Education: college    Past Medical History, Surgical history, Social history, and Family history were reviewed and updated as appropriate.   Please see review of systems for further details on the patient's review from today.   Objective:   Physical Exam:  BP 115/81   Pulse 61   Physical Exam Constitutional:      General: She is not in acute distress.    Appearance: She is well-developed.  Musculoskeletal:        General: No deformity.  Neurological:     Mental Status: She is alert and oriented to person, place, and time.     Coordination: Coordination normal.  Psychiatric:        Attention and Perception: Attention and perception normal. She does not perceive auditory or visual hallucinations.        Mood and Affect: Affect is not labile, blunt, angry or inappropriate.        Speech: Speech normal.        Behavior: Behavior normal.        Thought Content: Thought content normal. Thought content is not paranoid or delusional. Thought content does not include homicidal or suicidal ideation. Thought content does not include homicidal or suicidal plan.        Cognition and Memory: Cognition and memory normal.        Judgment: Judgment normal.     Comments: Patient presents as tearful and anxious when discussing psychosocial stressors Insight intact. No delusions.      Lab  Review:     Component Value Date/Time   NA 138 08/05/2018 0616   K 3.5 08/05/2018 0616   CL 109 08/05/2018 0616   CO2 24 08/05/2018 0616   GLUCOSE 87 08/05/2018 0616   BUN 5 (L) 08/05/2018 0616   CREATININE 0.65 08/05/2018 0616   CALCIUM 8.5 (L) 08/05/2018 0616   PROT 5.8 (L) 08/03/2018 1144   ALBUMIN 3.0 (L) 08/03/2018 1144   AST 24 08/03/2018 1144   ALT 19 08/03/2018 1144   ALKPHOS 36 (L) 08/03/2018 1144   BILITOT 0.7 08/03/2018 1144   GFRNONAA >60 08/05/2018 0616   GFRAA >60 08/05/2018 0616       Component Value Date/Time   WBC 6.5 08/06/2018 0533   RBC 4.05 08/06/2018 0533   HGB 13.0 08/06/2018 0533   HCT 37.6 08/06/2018 0533   PLT 268 08/06/2018 0533   MCV 92.8 08/06/2018 0533   MCH 32.1 08/06/2018 0533   MCHC 34.6 08/06/2018 0533   RDW 12.2 08/06/2018 0533   LYMPHSABS 2.7 08/06/2018 0533   MONOABS 0.5 08/06/2018 0533   EOSABS 0.2 08/06/2018 0533   BASOSABS 0.0 08/06/2018 0533    No results found for: POCLITH, LITHIUM   No results found for: PHENYTOIN, PHENOBARB, VALPROATE, CBMZ   .res Assessment: Plan:   Patient seen for 30 minutes and greater than 50% of visit spent counseling patient and coordination of care to include reviewing records from hospitalization and discussing with patient that hospital providers had recommended discontinuation of Ritalin due to Ritalin lowering the seizure threshold.  Discussed that Ritalin would be discontinued since it could potentially increase her risk for seizures.  Hospital records indicate that pt may have taken in more Ritalin and tramadol than prescribed prior to hospitalization. Advised patient to take lowest possible effective dose of Klonopin and to take one half tab instead of 1 tablet when this  may be adequate for level of anxiety. Discussed decreasing Ambien to 5 mg at bedtime as needed for insomnia to minimize dependence and adverse effects. Continue therapy with Lina Sayre, LPC. Pt to f/u in 3 months or sooner if  clinically indicated.  Patient advised to contact office with any questions, adverse effects, or acute worsening in signs and symptoms.  Laura Simmons was seen today for follow-up.  Diagnoses and all orders for this visit:  Insomnia due to other mental disorder -     zolpidem (AMBIEN) 5 MG tablet; Take 1 tablet (5 mg total) by mouth at bedtime as needed for sleep.  PTSD (post-traumatic stress disorder) -     clonazePAM (KLONOPIN) 1 MG tablet; TAKE ONE-HALF TO ONE TABLET BY MOUTH TWICE DAILY AS NEEDED FOR ANXIETY -     vortioxetine HBr (TRINTELLIX) 5 MG TABS tablet; Take 1 tablet (5 mg total) by mouth daily for 7 days, THEN 2 tablets (10 mg total) daily for 28 days.  Attention deficit hyperactivity disorder (ADHD), combined type     Please see After Visit Summary for patient specific instructions.  Future Appointments  Date Time Provider [place]  11/15/2018  1:00 PM Thayer Headings, PMHNP CP-CP None    No orders of the defined types were placed in this encounter.   -------------------------------

## 2018-10-13 ENCOUNTER — Encounter: Payer: Self-pay | Admitting: Psychiatry

## 2018-10-20 DIAGNOSIS — M25511 Pain in right shoulder: Secondary | ICD-10-CM | POA: Diagnosis not present

## 2018-10-31 DIAGNOSIS — M25511 Pain in right shoulder: Secondary | ICD-10-CM | POA: Diagnosis not present

## 2018-11-02 DIAGNOSIS — M25511 Pain in right shoulder: Secondary | ICD-10-CM | POA: Diagnosis not present

## 2018-11-02 DIAGNOSIS — M75121 Complete rotator cuff tear or rupture of right shoulder, not specified as traumatic: Secondary | ICD-10-CM | POA: Diagnosis not present

## 2018-11-07 DIAGNOSIS — M25511 Pain in right shoulder: Secondary | ICD-10-CM | POA: Diagnosis not present

## 2018-11-09 DIAGNOSIS — M25511 Pain in right shoulder: Secondary | ICD-10-CM | POA: Diagnosis not present

## 2018-11-15 ENCOUNTER — Encounter: Payer: Self-pay | Admitting: Psychiatry

## 2018-11-15 ENCOUNTER — Other Ambulatory Visit: Payer: Self-pay

## 2018-11-15 ENCOUNTER — Ambulatory Visit (INDEPENDENT_AMBULATORY_CARE_PROVIDER_SITE_OTHER): Payer: BC Managed Care – PPO | Admitting: Psychiatry

## 2018-11-15 VITALS — BP 158/93 | HR 76

## 2018-11-15 DIAGNOSIS — F4310 Post-traumatic stress disorder, unspecified: Secondary | ICD-10-CM

## 2018-11-15 DIAGNOSIS — F99 Mental disorder, not otherwise specified: Secondary | ICD-10-CM

## 2018-11-15 DIAGNOSIS — F5105 Insomnia due to other mental disorder: Secondary | ICD-10-CM

## 2018-11-15 DIAGNOSIS — F331 Major depressive disorder, recurrent, moderate: Secondary | ICD-10-CM | POA: Diagnosis not present

## 2018-11-15 NOTE — Progress Notes (Signed)
Laura Simmons [id number] January 06, 1976 43 y.o.  Subjective:   Patient ID:  Laura Simmons is a 43 y.o. (DOB Dec 25, 1975) female.  Chief Complaint:  Chief Complaint  Patient presents with  . Depression  . Anxiety  . ADD    HPI Laura Simmons presents to the office today for follow-up of depression, anxiety, insomnia and ADD. She reports that she has had worsening difficulty with concentration and focus with d/c of Ritalin. She reports having difficulty completing tasks. Frequently interrupting others and difficulty formulating thoughts. She reports that she had severe vomiting after taking Trintellix 5 mg x 1 and did not take any additional doses. She reports that mood has been depressed recently. Reports that she has been staying at home and watching tv most of the time. She reports that anxiety has been ok. Last panic attack was several months ago. Sleep has been ok with difficulty with sleep initiation. Often does not fall asleep until 1-2 am. She reports sleeping 9-10 hours a night and sometimes 12 hours. Appetite has been decreased due to stomach pain. Low energy and motivation. Denies SI.   Reports rarely taking Ambien prn.   Reports that she was taking Kratom in the past and reports that she did not re-start it after hospitalization.  She reports that she has not seen a neurologist as recommended by hospital providers, since her migraines are controlled with OTC meds.  Discussed diet hospital records indicate that she may have had some seizure activity and this was the reason neurology follow-up was recommended.  Patient reports that she was unaware of a concern for seizures and does not plan to follow-up with neurology.  Discussed that follow-up with PCP and this provider was recommended by hospital providers as well within 2 weeks of discharge.  Patient reports that she saw her PCP and has upcoming follow-up appointment scheduled.  Informed patient that it does not appear that she has a  follow-up appointment scheduled with PCP and PCPs last note indicated that he wished to follow-up with her around this time.  Past Psychiatric Medication Trials: Zyprexa- worsening depression Trileptal- benefits did not outweigh risks Ritalin Remeron Zoloft Celexa Lexapro Trintellix- Severe n/v Seroquel- RLS Depakote Gabapentin Propranolol-fatigue Topamax Trazodone- excessive daytime somnolence Klonopin Xanax Ambien May have taken Lamictal  Review of Systems:  Review of Systems  Endocrine:       Reports checking glucose levels at home and reports levels are frequently in the 70's.   Musculoskeletal: Negative for gait problem.       Has started PT for her shoulder and is no longer wearing a sling.   Neurological: Negative for tremors.  Psychiatric/Behavioral:       Please refer to HPI  Denies any recent seizure-like activity.   Medications: I have reviewed the patient's current medications.  Current Outpatient Medications  Medication Sig Dispense Refill  . calcium carbonate (TUMS) 500 MG chewable tablet Chew 1 tablet by mouth as needed for indigestion or heartburn.    . clonazePAM (KLONOPIN) 1 MG tablet TAKE ONE-HALF TO ONE TABLET BY MOUTH TWICE DAILY AS NEEDED FOR ANXIETY 60 tablet 2  . dimenhyDRINATE (DRAMAMINE) 50 MG tablet Take 50 mg by mouth at bedtime as needed for nausea.    Marland Kitchen docusate sodium (COLACE) 100 MG capsule Take 1 capsule (100 mg total) by mouth 2 (two) times daily. (Patient taking differently: Take 100 mg by mouth daily as needed for mild constipation. ) 30 capsule 2  . HYDROcodone-acetaminophen (NORCO/VICODIN) 5-325 MG tablet Take  1-2 tablets by mouth every 6 (six) hours as needed for severe pain. 21 tablet 0  . Multiple Vitamin (MULTIVITAMIN WITH MINERALS) TABS tablet Take 1 tablet by mouth daily. 30 tablet 1  . ondansetron (ZOFRAN) 4 MG tablet Take 1 tablet (4 mg total) by mouth 2 (two) times daily as needed for nausea or vomiting. 60 tablet 0  .  zolpidem (AMBIEN) 5 MG tablet Take 1 tablet (5 mg total) by mouth at bedtime as needed for sleep. 30 tablet 2  . folic acid (FOLVITE) 1 MG tablet Take 1 tablet (1 mg total) by mouth daily. (Patient not taking: Reported on 11/15/2018) 30 tablet 1  . pantoprazole (PROTONIX) 20 MG tablet Take 2 tablets (40 mg total) by mouth daily. 60 tablet 0  . thiamine 100 MG tablet Take 1 tablet (100 mg total) by mouth daily. (Patient not taking: Reported on 10/12/2018) 30 tablet 1   No current facility-administered medications for this visit.     Medication Side Effects: None  Allergies:  Allergies  Allergen Reactions  . Prochlorperazine Other (See Comments)    lockjaw Other reaction(s): Other  . Sumatriptan Succinate Anaphylaxis and Other (See Comments)    ALL TRIPTAN MEDS  . Triptans Anaphylaxis and Other (See Comments)    Pt states throat feels as if it is closing.  Marland Kitchen Ketamine Other (See Comments)    "Pt. Reports she never wants again." major hallucinations  . Reglan [Metoclopramide] Other (See Comments)    "crawling out of my skin"    Past Medical History:  Diagnosis Date  . ADHD (attention deficit hyperactivity disorder)   . Agoraphobia   . Bipolar 1 disorder ([place])    patient denies  . Chronic pain    just migraines per patient  . Endometriosis determined by laparoscopy   . GERD (gastroesophageal reflux disease)   . High platelet count   . Migraine   . Personality disorder ([place])   . Right adrenal mass ([place])    per ct 09-06-2015  . Right ovarian cyst   . Vaginal Pap smear, abnormal     Family History  Problem Relation Age of Onset  . Migraines Mother   . Anxiety disorder Mother   . Diabetes Maternal Grandmother   . Stroke Maternal Grandfather   . Heart attack Paternal Grandfather   . Anxiety disorder Paternal Aunt   . Post-traumatic stress disorder Cousin        After [place]    Social History   Socioeconomic History  . Marital status: Single    Spouse name: Not  on file  . Number of children: Not on file  . Years of education: Not on file  . Highest education level: Not on file  Occupational History  . Not on file  Social Needs  . Financial resource strain: Not on file  . Food insecurity    Worry: Not on file    Inability: Not on file  . Transportation needs    Medical: Not on file    Non-medical: Not on file  Tobacco Use  . Smoking status: Current Every Day Smoker    Packs/day: 0.50    Years: 20.00    Pack years: 10.00    Types: Cigarettes  . Smokeless tobacco: Never Used  Substance and Sexual Activity  . Alcohol use: Yes    Comment: very rare  . Drug use: No  . Sexual activity: Not Currently    Birth control/protection: None  Lifestyle  . Physical activity  Days per week: Not on file    Minutes per session: Not on file  . Stress: Not on file  Relationships  . Social Herbalist on phone: Not on file    Gets together: Not on file    Attends religious service: Not on file    Active member of club or organization: Not on file    Attends meetings of clubs or organizations: Not on file    Relationship status: Not on file  . Intimate partner violence    Fear of current or ex partner: Not on file    Emotionally abused: Not on file    Physically abused: Not on file    Forced sexual activity: Not on file  Other Topics Concern  . Not on file  Social History Narrative   Lives with parents in a one story home.  No children.     Currently not working.  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    Education: college    Past Medical History, Surgical history, Social history, and Family history were reviewed and updated as appropriate.   Please see review of systems for further details on the patient's review from today.   Objective:   Physical Exam:  BP (!) 158/93   Pulse 76   Physical Exam Constitutional:      General: She is not in acute distress.    Appearance: She is well-developed.   Musculoskeletal:        General: No deformity.  Neurological:     Mental Status: She is alert and oriented to person, place, and time.     Coordination: Coordination normal.  Psychiatric:        Attention and Perception: Attention and perception normal. She does not perceive auditory or visual hallucinations.        Mood and Affect: Mood normal. Mood is not anxious or depressed. Affect is not labile, blunt, angry or inappropriate.        Speech: Speech normal.        Behavior: Behavior normal.        Thought Content: Thought content normal. Thought content does not include homicidal or suicidal ideation. Thought content does not include homicidal or suicidal plan.        Cognition and Memory: Cognition and memory normal.        Judgment: Judgment normal.     Comments: Insight intact. No delusions.      Lab Review:     Component Value Date/Time   NA 138 08/05/2018 0616   K 3.5 08/05/2018 0616   CL 109 08/05/2018 0616   CO2 24 08/05/2018 0616   GLUCOSE 87 08/05/2018 0616   BUN 5 (L) 08/05/2018 0616   CREATININE 0.65 08/05/2018 0616   CALCIUM 8.5 (L) 08/05/2018 0616   PROT 5.8 (L) 08/03/2018 1144   ALBUMIN 3.0 (L) 08/03/2018 1144   AST 24 08/03/2018 1144   ALT 19 08/03/2018 1144   ALKPHOS 36 (L) 08/03/2018 1144   BILITOT 0.7 08/03/2018 1144   GFRNONAA >60 08/05/2018 0616   GFRAA >60 08/05/2018 0616       Component Value Date/Time   WBC 6.5 08/06/2018 0533   RBC 4.05 08/06/2018 0533   HGB 13.0 08/06/2018 0533   HCT 37.6 08/06/2018 0533   PLT 268 08/06/2018 0533   MCV 92.8 08/06/2018 0533   MCH 32.1 08/06/2018 0533   MCHC 34.6 08/06/2018 0533   RDW 12.2 08/06/2018 0533  LYMPHSABS 2.7 08/06/2018 0533   MONOABS 0.5 08/06/2018 0533   EOSABS 0.2 08/06/2018 0533   BASOSABS 0.0 08/06/2018 0533    No results found for: POCLITH, LITHIUM   No results found for: PHENYTOIN, PHENOBARB, VALPROATE, CBMZ   .res Assessment: Plan:   Patient reports significant worsening in  concentration and mood since discontinuation of Ritalin and requests that Ritalin be re-started.  Discussed that Ritalin would not be restarted due to potential risks with recent hospitalization and possible seizure activity.  Discussed that other medication options could be considered to improve mood signs and symptoms.  Discussed that Wellbutrin XL is used off label for treatment of attention deficit, however this would not be recommended since it can potentially lower seizure threshold. Discussed potential benefits, risks, and side effects of several possible treatment options for depression.  Patient reports that she has had severe adverse effects with multiple SSRIs and SNRIs and does not wish to take an antidepressant again.  Discussed potential benefits, risks, and side effects of Lamictal since patient had not reported taking this medication in the past.  Patient reports that she thinks she has taken Lamictal previously for headaches and had an adverse reaction.  Discussed considering pharmacogenetics testing to minimize risk of adverse effects and to help determine medication that is likely to be best tolerated.  Patient reports that cost may be a potential barrier to pharmacogenetic testing.  Patient provided with information to contact pharmacogenetics testing company directly to inquire about her potential coverage and cost. Discussed continuing current medications at this time since patient declined several possible treatment options. Discussed having her follow-up on an as-needed basis, depending on if she chooses to pursue pharmacogenetics testing or consider other treatment options. Patient advised to contact office with any questions, adverse effects, or acute worsening in signs and symptoms.  Laura Simmons was seen today for depression, anxiety and add.  Diagnoses and all orders for this visit:  Major depressive disorder, recurrent episode, moderate (HCC)  PTSD (post-traumatic stress  disorder)  Insomnia due to other mental disorder     Please see After Visit Summary for patient specific instructions.  No future appointments.  No orders of the defined types were placed in this encounter.   -------------------------------

## 2018-11-17 DIAGNOSIS — M25511 Pain in right shoulder: Secondary | ICD-10-CM | POA: Diagnosis not present

## 2018-11-23 DIAGNOSIS — M25511 Pain in right shoulder: Secondary | ICD-10-CM | POA: Diagnosis not present

## 2018-11-25 DIAGNOSIS — M25511 Pain in right shoulder: Secondary | ICD-10-CM | POA: Diagnosis not present

## 2018-11-29 DIAGNOSIS — M25511 Pain in right shoulder: Secondary | ICD-10-CM | POA: Diagnosis not present

## 2018-12-02 DIAGNOSIS — M25511 Pain in right shoulder: Secondary | ICD-10-CM | POA: Diagnosis not present

## 2018-12-06 DIAGNOSIS — M25511 Pain in right shoulder: Secondary | ICD-10-CM | POA: Diagnosis not present

## 2018-12-09 DIAGNOSIS — M25511 Pain in right shoulder: Secondary | ICD-10-CM | POA: Diagnosis not present

## 2018-12-12 DIAGNOSIS — M25511 Pain in right shoulder: Secondary | ICD-10-CM | POA: Diagnosis not present

## 2018-12-21 DIAGNOSIS — M25511 Pain in right shoulder: Secondary | ICD-10-CM | POA: Diagnosis not present

## 2018-12-23 DIAGNOSIS — M25511 Pain in right shoulder: Secondary | ICD-10-CM | POA: Diagnosis not present

## 2018-12-30 DIAGNOSIS — M25511 Pain in right shoulder: Secondary | ICD-10-CM | POA: Diagnosis not present

## 2019-01-06 DIAGNOSIS — M75121 Complete rotator cuff tear or rupture of right shoulder, not specified as traumatic: Secondary | ICD-10-CM | POA: Diagnosis not present

## 2019-01-10 DIAGNOSIS — M25511 Pain in right shoulder: Secondary | ICD-10-CM | POA: Diagnosis not present

## 2019-01-16 DIAGNOSIS — M25511 Pain in right shoulder: Secondary | ICD-10-CM | POA: Diagnosis not present

## 2019-01-19 DIAGNOSIS — M25511 Pain in right shoulder: Secondary | ICD-10-CM | POA: Diagnosis not present

## 2019-01-22 ENCOUNTER — Other Ambulatory Visit: Payer: Self-pay | Admitting: Psychiatry

## 2019-01-22 DIAGNOSIS — F4310 Post-traumatic stress disorder, unspecified: Secondary | ICD-10-CM

## 2019-01-22 DIAGNOSIS — F99 Mental disorder, not otherwise specified: Secondary | ICD-10-CM

## 2019-01-22 DIAGNOSIS — F5105 Insomnia due to other mental disorder: Secondary | ICD-10-CM

## 2019-01-22 NOTE — Telephone Encounter (Signed)
Last apt 10/27, nothing scheduled for f/up

## 2019-01-27 DIAGNOSIS — M25511 Pain in right shoulder: Secondary | ICD-10-CM | POA: Diagnosis not present

## 2019-02-01 DIAGNOSIS — M25511 Pain in right shoulder: Secondary | ICD-10-CM | POA: Diagnosis not present

## 2019-02-03 DIAGNOSIS — M25511 Pain in right shoulder: Secondary | ICD-10-CM | POA: Diagnosis not present

## 2019-02-20 ENCOUNTER — Ambulatory Visit (INDEPENDENT_AMBULATORY_CARE_PROVIDER_SITE_OTHER): Payer: BC Managed Care – PPO | Admitting: Psychiatry

## 2019-02-20 ENCOUNTER — Other Ambulatory Visit: Payer: Self-pay

## 2019-02-20 ENCOUNTER — Encounter: Payer: Self-pay | Admitting: Psychiatry

## 2019-02-20 VITALS — BP 125/83 | HR 72 | Wt 120.0 lb

## 2019-02-20 DIAGNOSIS — F99 Mental disorder, not otherwise specified: Secondary | ICD-10-CM | POA: Diagnosis not present

## 2019-02-20 DIAGNOSIS — F5105 Insomnia due to other mental disorder: Secondary | ICD-10-CM

## 2019-02-20 DIAGNOSIS — F4310 Post-traumatic stress disorder, unspecified: Secondary | ICD-10-CM | POA: Diagnosis not present

## 2019-02-20 DIAGNOSIS — F902 Attention-deficit hyperactivity disorder, combined type: Secondary | ICD-10-CM | POA: Diagnosis not present

## 2019-02-20 MED ORDER — ZOLPIDEM TARTRATE 5 MG PO TABS: ORAL_TABLET | ORAL | 1 refills | Status: DC

## 2019-02-20 MED ORDER — ATOMOXETINE HCL 25 MG PO CAPS: ORAL_CAPSULE | ORAL | 0 refills | Status: DC

## 2019-02-20 MED ORDER — CLONAZEPAM 1 MG PO TABS: ORAL_TABLET | ORAL | 1 refills | Status: DC

## 2019-02-20 NOTE — Progress Notes (Signed)
Laura Simmons [id number] 10-Sep-1975 44 y.o.  Subjective:   Patient ID:  Laura Simmons is a 44 y.o. (DOB 25-Nov-1975) female.  Chief Complaint:  Chief Complaint  Patient presents with  . ADD  . Follow-up    Anxiety, Insomnia    HPI Laura Simmons presents to the office today for follow-up of anxiety, insomnia, and ADD. She reports that she has been having difficulty without tx of ADHD. She reports that she has difficulty focusing on music and TV shows and feels overly stimulated at times. She reports that she has not been as productive as she would like. She reports that she is anxious at time of exam. Anxiety- "it's been ok, I guess." She reports that she has anxiety in crowds. Anxiety has lessened with her parents getting vaccinated. Reports that mood has been "ok." Reports some sadness in response to social isolation. She reports that she would like to work and is concerned about what she could do in terms of shoulder pain and not being able to focus. Reports that she is no longer having crying episodes about her ex and feels that she has "reached the acceptance stage" of his death. Sleeping ok overall with some alteration in sleep cycle. Appetite has been fair. She reports energy is good. Reports that motivation is low and concentration impairment contributes to this.   Denies SI.   Past Psychiatric Medication Trials: Zyprexa- worsening depression Trileptal- benefits did not outweigh risks Ritalin Remeron Zoloft Celexa Lexapro Trintellix- Severe n/v Seroquel- RLS Depakote Gabapentin Propranolol-fatigue Topamax Trazodone- excessive daytime somnolence Klonopin Xanax Ambien May have taken Lamictal   PHQ2-9     Office Visit from 03/25/2016 in [facility] at [facility]  PHQ-2 Total Score  0       Review of Systems:  Review of Systems  HENT: Positive for tinnitus.   Gastrointestinal: Positive for abdominal distention, constipation and  diarrhea.  Musculoskeletal: Negative for gait problem.  Neurological: Positive for headaches. Negative for tremors.  Psychiatric/Behavioral:       Please refer to HPI    Medications: I have reviewed the patient's current medications.  Current Outpatient Medications  Medication Sig Dispense Refill  . calcium carbonate (TUMS) 500 MG chewable tablet Chew 1 tablet by mouth as needed for indigestion or heartburn.    . clonazePAM (KLONOPIN) 1 MG tablet TAKE 1/2 TO 1 TABLET BY MOUTH TWICE DAILY AS NEEDED FOR ANXIETY 60 tablet 1  . dimenhyDRINATE (DRAMAMINE) 50 MG tablet Take 50 mg by mouth at bedtime as needed for nausea.    Marland Kitchen docusate sodium (COLACE) 100 MG capsule Take 1 capsule (100 mg total) by mouth 2 (two) times daily. (Patient taking differently: Take 100 mg by mouth daily as needed for mild constipation. ) 30 capsule 2  . Multiple Vitamin (MULTIVITAMIN WITH MINERALS) TABS tablet Take 1 tablet by mouth daily. 30 tablet 1  . zolpidem (AMBIEN) 5 MG tablet TAKE 1 TABLET(5 MG) BY MOUTH AT BEDTIME AS NEEDED FOR SLEEP 30 tablet 1  . atomoxetine (STRATTERA) 25 MG capsule Take 1 capsule (25 mg total) by mouth every morning AND 2 capsules (50 mg total) every morning. 50 capsule 0  . folic acid (FOLVITE) 1 MG tablet Take 1 tablet (1 mg total) by mouth daily. (Patient not taking: Reported on 11/15/2018) 30 tablet 1  . HYDROcodone-acetaminophen (NORCO/VICODIN) 5-325 MG tablet Take 1-2 tablets by mouth every 6 (six) hours as needed for severe pain. (Patient not taking: Reported on 02/20/2019) 21  tablet 0  . ondansetron (ZOFRAN) 4 MG tablet Take 1 tablet (4 mg total) by mouth 2 (two) times daily as needed for nausea or vomiting. (Patient not taking: Reported on 02/20/2019) 60 tablet 0  . pantoprazole (PROTONIX) 20 MG tablet Take 2 tablets (40 mg total) by mouth daily. 60 tablet 0  . thiamine 100 MG tablet Take 1 tablet (100 mg total) by mouth daily. (Patient not taking: Reported on 10/12/2018) 30 tablet 1   No  current facility-administered medications for this visit.    Medication Side Effects: None  Allergies:  Allergies  Allergen Reactions  . Prochlorperazine Other (See Comments)    lockjaw Other reaction(s): Other  . Sumatriptan Succinate Anaphylaxis and Other (See Comments)    ALL TRIPTAN MEDS  . Triptans Anaphylaxis and Other (See Comments)    Pt states throat feels as if it is closing.  Marland Kitchen Ketamine Other (See Comments)    "Pt. Reports she never wants again." major hallucinations  . Reglan [Metoclopramide] Other (See Comments)    "crawling out of my skin"    Past Medical History:  Diagnosis Date  . ADHD (attention deficit hyperactivity disorder)   . Agoraphobia   . Bipolar 1 disorder ([place])    patient denies  . Chronic pain    just migraines per patient  . Endometriosis determined by laparoscopy   . GERD (gastroesophageal reflux disease)   . High platelet count   . Migraine   . Personality disorder ([place])   . Right adrenal mass ([place])    per ct 09-06-2015  . Right ovarian cyst   . Vaginal Pap smear, abnormal     Family History  Problem Relation Age of Onset  . Migraines Mother   . Anxiety disorder Mother   . Diabetes Maternal Grandmother   . Stroke Maternal Grandfather   . Heart attack Paternal Grandfather   . Anxiety disorder Paternal Aunt   . Post-traumatic stress disorder Cousin        After [place]    Social History   Socioeconomic History  . Marital status: Single    Spouse name: Not on file  . Number of children: Not on file  . Years of education: Not on file  . Highest education level: Not on file  Occupational History  . Not on file  Tobacco Use  . Smoking status: Current Every Day Smoker    Packs/day: 0.50    Years: 20.00    Pack years: 10.00    Types: Cigarettes  . Smokeless tobacco: Never Used  Substance and Sexual Activity  . Alcohol use: Yes    Comment: very rare  . Drug use: No  . Sexual activity: Not Currently    Birth  control/protection: None  Other Topics Concern  . Not on file  Social History Narrative   Lives with parents in a one story home.  No children.     Currently not working.  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    Education: college   Social Determinants of Radio broadcast assistant Strain:   . Difficulty of Paying Living Expenses: Not on file  Food Insecurity:   . Worried About Charity fundraiser in the Last Year: Not on file  . Ran Out of Food in the Last Year: Not on file  Transportation Needs:   . Lack of Transportation (Medical): Not on file  . Lack of Transportation (Non-Medical): Not on file  Physical Activity:   .  Days of Exercise per Week: Not on file  . Minutes of Exercise per Session: Not on file  Stress:   . Feeling of Stress : Not on file  Social Connections:   . Frequency of Communication with Friends and Family: Not on file  . Frequency of Social Gatherings with Friends and Family: Not on file  . Attends Religious Services: Not on file  . Active Member of Clubs or Organizations: Not on file  . Attends Archivist Meetings: Not on file  . Marital Status: Not on file  Intimate Partner Violence:   . Fear of Current or Ex-Partner: Not on file  . Emotionally Abused: Not on file  . Physically Abused: Not on file  . Sexually Abused: Not on file    Past Medical History, Surgical history, Social history, and Family history were reviewed and updated as appropriate.   Please see review of systems for further details on the patient's review from today.   Objective:   Physical Exam:  BP 125/83   Pulse 72   Wt 120 lb (54.4 kg)   BMI 20.60 kg/m   Physical Exam Constitutional:      General: She is not in acute distress.    Appearance: She is well-developed.  Musculoskeletal:        General: No deformity.  Neurological:     Mental Status: She is alert and oriented to person, place, and time.     Coordination: Coordination normal.   Psychiatric:        Attention and Perception: Attention and perception normal. She does not perceive auditory or visual hallucinations.        Mood and Affect: Mood normal. Mood is not anxious or depressed. Affect is not labile, blunt, angry or inappropriate.        Speech: Speech normal.        Behavior: Behavior normal.        Thought Content: Thought content normal. Thought content is not paranoid or delusional. Thought content does not include homicidal or suicidal ideation. Thought content does not include homicidal or suicidal plan.        Cognition and Memory: Cognition and memory normal.        Judgment: Judgment normal.     Comments: Insight intact     Lab Review:     Component Value Date/Time   NA 138 08/05/2018 0616   K 3.5 08/05/2018 0616   CL 109 08/05/2018 0616   CO2 24 08/05/2018 0616   GLUCOSE 87 08/05/2018 0616   BUN 5 (L) 08/05/2018 0616   CREATININE 0.65 08/05/2018 0616   CALCIUM 8.5 (L) 08/05/2018 0616   PROT 5.8 (L) 08/03/2018 1144   ALBUMIN 3.0 (L) 08/03/2018 1144   AST 24 08/03/2018 1144   ALT 19 08/03/2018 1144   ALKPHOS 36 (L) 08/03/2018 1144   BILITOT 0.7 08/03/2018 1144   GFRNONAA >60 08/05/2018 0616   GFRAA >60 08/05/2018 0616       Component Value Date/Time   WBC 6.5 08/06/2018 0533   RBC 4.05 08/06/2018 0533   HGB 13.0 08/06/2018 0533   HCT 37.6 08/06/2018 0533   PLT 268 08/06/2018 0533   MCV 92.8 08/06/2018 0533   MCH 32.1 08/06/2018 0533   MCHC 34.6 08/06/2018 0533   RDW 12.2 08/06/2018 0533   LYMPHSABS 2.7 08/06/2018 0533   MONOABS 0.5 08/06/2018 0533   EOSABS 0.2 08/06/2018 0533   BASOSABS 0.0 08/06/2018 0533    No results found for:  POCLITH, LITHIUM   No results found for: PHENYTOIN, PHENOBARB, VALPROATE, CBMZ   .res Assessment: Plan:   Discussed nonstimulant treatment options for attention deficit disorder, to include potential benefits, risks, and side effects of both Strattera and Intuniv.  Patient reports that she would  prefer to start trial with Strattera since she is concerned about potential risk of drowsiness with Intuniv.  Discussed starting Strattera at low dose and increasing based on tolerability considering patient's history of side effects with multiple medications.  Will start Strattera at 25 mg p.o. every morning and then increase to 2 capsules p.o. every morning for ADHD.   Will continue Klonopin for anxiety and Ambien for insomnia. Patient to follow-up in 4 weeks or sooner if clinically indicated. Patient advised to contact office with any questions, adverse effects, or acute worsening in signs and symptoms.  Valkyrie was seen today for add and follow-up.  Diagnoses and all orders for this visit:  Attention deficit hyperactivity disorder (ADHD), combined type -     atomoxetine (STRATTERA) 25 MG capsule; Take 1 capsule (25 mg total) by mouth every morning AND 2 capsules (50 mg total) every morning.  PTSD (post-traumatic stress disorder) -     clonazePAM (KLONOPIN) 1 MG tablet; TAKE 1/2 TO 1 TABLET BY MOUTH TWICE DAILY AS NEEDED FOR ANXIETY  Insomnia due to other mental disorder -     zolpidem (AMBIEN) 5 MG tablet; TAKE 1 TABLET(5 MG) BY MOUTH AT BEDTIME AS NEEDED FOR SLEEP     Please see After Visit Summary for patient specific instructions.  Future Appointments  Date Time Provider [place]  03/20/2019  9:15 AM Thayer Headings, PMHNP [facility] None    No orders of the defined types were placed in this encounter.   -------------------------------

## 2019-03-20 ENCOUNTER — Encounter: Payer: Self-pay | Admitting: Psychiatry

## 2019-03-20 ENCOUNTER — Other Ambulatory Visit: Payer: Self-pay

## 2019-03-20 ENCOUNTER — Ambulatory Visit (INDEPENDENT_AMBULATORY_CARE_PROVIDER_SITE_OTHER): Payer: BC Managed Care – PPO | Admitting: Psychiatry

## 2019-03-20 VITALS — BP 108/67 | HR 72

## 2019-03-20 DIAGNOSIS — F5105 Insomnia due to other mental disorder: Secondary | ICD-10-CM | POA: Diagnosis not present

## 2019-03-20 DIAGNOSIS — F99 Mental disorder, not otherwise specified: Secondary | ICD-10-CM

## 2019-03-20 DIAGNOSIS — F902 Attention-deficit hyperactivity disorder, combined type: Secondary | ICD-10-CM

## 2019-03-20 DIAGNOSIS — F329 Major depressive disorder, single episode, unspecified: Secondary | ICD-10-CM

## 2019-03-20 DIAGNOSIS — F32A Depression, unspecified: Secondary | ICD-10-CM

## 2019-03-20 DIAGNOSIS — F4310 Post-traumatic stress disorder, unspecified: Secondary | ICD-10-CM

## 2019-03-20 MED ORDER — ATOMOXETINE HCL 80 MG PO CAPS: 80.0000 mg | ORAL_CAPSULE | Freq: Every morning | ORAL | 1 refills | Status: DC

## 2019-03-20 NOTE — Progress Notes (Signed)
Laura Simmons [id number] 09-Dec-1975 44 y.o.  Subjective:   Patient ID:  Laura Simmons is a 43 y.o. (DOB 1975/03/06) female.  Chief Complaint:  Chief Complaint  Patient presents with  . ADD  . Anxiety    HPI Laura Simmons presents to the office today for follow-up of ADD, anxiety, insomnia, and depression. She reports that she has not had any tolerability issues with Strattera. She reports that she has not had significant improvement in ADD s/s. She reports that she has noticed some slight increase in motivation to do things in her mood. She reports that she has difficulty focusing on one task at a time. Difficulty writing in her journal now. She reports that she has had to use a list to get groceries. She reports that her mood is "the same." She reports motivation is lower in general and does not feel like doing the things she normally does. Energy has been low. She reports that her sleep is sporadic with some nights having trouble with sleep initiation and other nights falling asleep without difficulty and then having middle of the night awakening. Anxiety has been increased. She reports that she has panic when she goes out and feels irritated with increased stimuli. She reports that she does not leave home often, except for when doing something for her parents. Reports feelings of guilt about wanting to do other things when she feels she needs to take care of her parents. She reports that she has been eating "a lot of junk food" and appetite has been ok. Denies SI.   Mother recently had to have pacemaker and pt was assisting her.   Past Psychiatric Medication Trials: Zyprexa- worsening depression Trileptal- benefits did not outweigh risks Ritalin Remeron Zoloft Celexa Lexapro Trintellix- Severe n/v Seroquel- RLS Depakote Gabapentin Propranolol-fatigue Topamax Trazodone- excessive daytime somnolence Klonopin Xanax Ambien May have taken Lamictal  PHQ2-9     Office Visit  from 03/25/2016 in [facility] at [facility]  PHQ-2 Total Score  0       Review of Systems:  Review of Systems  HENT: Positive for sinus pain.   Gastrointestinal: Positive for abdominal pain, constipation and diarrhea.  Musculoskeletal: Negative for gait problem.       Recent increased shoulder pain.   Allergic/Immunologic: Positive for environmental allergies.  Neurological: Negative for tremors.       Some HA's which she reports may be due to weather and menstrual cycle.   Psychiatric/Behavioral:       Please refer to HPI    Medications: I have reviewed the patient's current medications.  Current Outpatient Medications  Medication Sig Dispense Refill  . calcium carbonate (TUMS) 500 MG chewable tablet Chew 1 tablet by mouth as needed for indigestion or heartburn.    . clonazePAM (KLONOPIN) 1 MG tablet TAKE 1/2 TO 1 TABLET BY MOUTH TWICE DAILY AS NEEDED FOR ANXIETY 60 tablet 1  . dimenhyDRINATE (DRAMAMINE) 50 MG tablet Take 50 mg by mouth at bedtime as needed for nausea.    Marland Kitchen docusate sodium (COLACE) 100 MG capsule Take 1 capsule (100 mg total) by mouth 2 (two) times daily. (Patient taking differently: Take 100 mg by mouth daily as needed for mild constipation. ) 30 capsule 2  . Multiple Vitamin (MULTIVITAMIN WITH MINERALS) TABS tablet Take 1 tablet by mouth daily. 30 tablet 1  . zolpidem (AMBIEN) 5 MG tablet TAKE 1 TABLET(5 MG) BY MOUTH AT BEDTIME AS NEEDED FOR SLEEP 30 tablet 1  .  atomoxetine (STRATTERA) 80 MG capsule Take 1 capsule (80 mg total) by mouth every morning. 30 capsule 1  . folic acid (FOLVITE) 1 MG tablet Take 1 tablet (1 mg total) by mouth daily. (Patient not taking: Reported on 11/15/2018) 30 tablet 1  . HYDROcodone-acetaminophen (NORCO/VICODIN) 5-325 MG tablet Take 1-2 tablets by mouth every 6 (six) hours as needed for severe pain. (Patient not taking: Reported on 02/20/2019) 21 tablet 0  . ondansetron (ZOFRAN) 4 MG tablet Take 1 tablet (4 mg  total) by mouth 2 (two) times daily as needed for nausea or vomiting. (Patient not taking: Reported on 02/20/2019) 60 tablet 0  . pantoprazole (PROTONIX) 20 MG tablet Take 2 tablets (40 mg total) by mouth daily. 60 tablet 0  . thiamine 100 MG tablet Take 1 tablet (100 mg total) by mouth daily. (Patient not taking: Reported on 10/12/2018) 30 tablet 1   No current facility-administered medications for this visit.    Medication Side Effects: None  Allergies:  Allergies  Allergen Reactions  . Prochlorperazine Other (See Comments)    lockjaw Other reaction(s): Other  . Sumatriptan Succinate Anaphylaxis and Other (See Comments)    ALL TRIPTAN MEDS  . Triptans Anaphylaxis and Other (See Comments)    Pt states throat feels as if it is closing.  Marland Kitchen Ketamine Other (See Comments)    "Pt. Reports she never wants again." major hallucinations  . Reglan [Metoclopramide] Other (See Comments)    "crawling out of my skin"    Past Medical History:  Diagnosis Date  . ADHD (attention deficit hyperactivity disorder)   . Agoraphobia   . Bipolar 1 disorder ([place])    patient denies  . Chronic pain    just migraines per patient  . Endometriosis determined by laparoscopy   . GERD (gastroesophageal reflux disease)   . High platelet count   . Migraine   . Personality disorder ([place])   . Right adrenal mass ([place])    per ct 09-06-2015  . Right ovarian cyst   . Vaginal Pap smear, abnormal     Family History  Problem Relation Age of Onset  . Migraines Mother   . Anxiety disorder Mother   . Diabetes Maternal Grandmother   . Stroke Maternal Grandfather   . Heart attack Paternal Grandfather   . Anxiety disorder Paternal Aunt   . Post-traumatic stress disorder Cousin        After [place]    Social History   Socioeconomic History  . Marital status: Single    Spouse name: Not on file  . Number of children: Not on file  . Years of education: Not on file  . Highest education level: Not on file   Occupational History  . Not on file  Tobacco Use  . Smoking status: Current Every Day Smoker    Packs/day: 0.50    Years: 20.00    Pack years: 10.00    Types: Cigarettes  . Smokeless tobacco: Never Used  Substance and Sexual Activity  . Alcohol use: Yes    Comment: very rare  . Drug use: No  . Sexual activity: Not Currently    Birth control/protection: None  Other Topics Concern  . Not on file  Social History Narrative   Lives with parents in a one story home.  No children.     Currently not working.  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    Education: college   Social Determinants of Engineer, drilling  Resource Strain:   . Difficulty of Paying Living Expenses: Not on file  Food Insecurity:   . Worried About Charity fundraiser in the Last Year: Not on file  . Ran Out of Food in the Last Year: Not on file  Transportation Needs:   . Lack of Transportation (Medical): Not on file  . Lack of Transportation (Non-Medical): Not on file  Physical Activity:   . Days of Exercise per Week: Not on file  . Minutes of Exercise per Session: Not on file  Stress:   . Feeling of Stress : Not on file  Social Connections:   . Frequency of Communication with Friends and Family: Not on file  . Frequency of Social Gatherings with Friends and Family: Not on file  . Attends Religious Services: Not on file  . Active Member of Clubs or Organizations: Not on file  . Attends Archivist Meetings: Not on file  . Marital Status: Not on file  Intimate Partner Violence:   . Fear of Current or Ex-Partner: Not on file  . Emotionally Abused: Not on file  . Physically Abused: Not on file  . Sexually Abused: Not on file    Past Medical History, Surgical history, Social history, and Family history were reviewed and updated as appropriate.   Please see review of systems for further details on the patient's review from today.   Objective:   Physical Exam:  BP 108/67    Pulse 72   Physical Exam Constitutional:      General: She is not in acute distress. Musculoskeletal:        General: No deformity.  Neurological:     Mental Status: She is alert and oriented to person, place, and time.     Coordination: Coordination normal.  Psychiatric:        Attention and Perception: Attention and perception normal. She does not perceive auditory or visual hallucinations.        Mood and Affect: Mood is anxious and depressed. Affect is tearful. Affect is not labile, blunt, angry or inappropriate.        Speech: Speech normal.        Behavior: Behavior normal.        Thought Content: Thought content normal. Thought content is not paranoid or delusional. Thought content does not include homicidal or suicidal ideation. Thought content does not include homicidal or suicidal plan.        Cognition and Memory: Cognition and memory normal.        Judgment: Judgment normal.     Comments: Insight intact     Lab Review:     Component Value Date/Time   NA 138 08/05/2018 0616   K 3.5 08/05/2018 0616   CL 109 08/05/2018 0616   CO2 24 08/05/2018 0616   GLUCOSE 87 08/05/2018 0616   BUN 5 (L) 08/05/2018 0616   CREATININE 0.65 08/05/2018 0616   CALCIUM 8.5 (L) 08/05/2018 0616   PROT 5.8 (L) 08/03/2018 1144   ALBUMIN 3.0 (L) 08/03/2018 1144   AST 24 08/03/2018 1144   ALT 19 08/03/2018 1144   ALKPHOS 36 (L) 08/03/2018 1144   BILITOT 0.7 08/03/2018 1144   GFRNONAA >60 08/05/2018 0616   GFRAA >60 08/05/2018 0616       Component Value Date/Time   WBC 6.5 08/06/2018 0533   RBC 4.05 08/06/2018 0533   HGB 13.0 08/06/2018 0533   HCT 37.6 08/06/2018 0533   PLT 268 08/06/2018 0533  MCV 92.8 08/06/2018 0533   MCH 32.1 08/06/2018 0533   MCHC 34.6 08/06/2018 0533   RDW 12.2 08/06/2018 0533   LYMPHSABS 2.7 08/06/2018 0533   MONOABS 0.5 08/06/2018 0533   EOSABS 0.2 08/06/2018 0533   BASOSABS 0.0 08/06/2018 0533    No results found for: POCLITH, LITHIUM   No results  found for: PHENYTOIN, PHENOBARB, VALPROATE, CBMZ   .res Assessment: Plan:   Will increase Strattera to 80 mg po qd for ADD since pt reports that she has been tolerating Strattera without difficulty and discussed that 80 mg is typical target dose. Pt requests script be sent to her usual pharmacy. Advised pt to call back if she would like script sent to another pharmacy due to cost.  Will continue Klonopin for anxiety.  Continue Ambien for insomnia.  Discussed possibly resuming therapy with Lina Sayre, [facility] re: anxiety and sadness in response to psychosocial stressors.  Pt to f/u in 4 weeks or sooner if clinically indicated.  Patient advised to contact office with any questions, adverse effects, or acute worsening in signs and symptoms.  Maxima was seen today for add and anxiety.  Diagnoses and all orders for this visit:  Attention deficit hyperactivity disorder (ADHD), combined type -     atomoxetine (STRATTERA) 80 MG capsule; Take 1 capsule (80 mg total) by mouth every morning.  PTSD (post-traumatic stress disorder)  Insomnia due to other mental disorder  Depression, unspecified depression type     Please see After Visit Summary for patient specific instructions.  No future appointments.  No orders of the defined types were placed in this encounter.   -------------------------------

## 2019-04-17 ENCOUNTER — Other Ambulatory Visit: Payer: Self-pay

## 2019-04-17 ENCOUNTER — Encounter: Payer: Self-pay | Admitting: Psychiatry

## 2019-04-17 ENCOUNTER — Ambulatory Visit (INDEPENDENT_AMBULATORY_CARE_PROVIDER_SITE_OTHER): Payer: BC Managed Care – PPO | Admitting: Psychiatry

## 2019-04-17 VITALS — BP 133/90 | HR 80

## 2019-04-17 DIAGNOSIS — F5105 Insomnia due to other mental disorder: Secondary | ICD-10-CM

## 2019-04-17 DIAGNOSIS — F902 Attention-deficit hyperactivity disorder, combined type: Secondary | ICD-10-CM | POA: Diagnosis not present

## 2019-04-17 DIAGNOSIS — F99 Mental disorder, not otherwise specified: Secondary | ICD-10-CM | POA: Diagnosis not present

## 2019-04-17 DIAGNOSIS — F4310 Post-traumatic stress disorder, unspecified: Secondary | ICD-10-CM

## 2019-04-17 MED ORDER — ZOLPIDEM TARTRATE 5 MG PO TABS: ORAL_TABLET | ORAL | 1 refills | Status: DC

## 2019-04-17 MED ORDER — CLONAZEPAM 1 MG PO TABS: ORAL_TABLET | ORAL | 1 refills | Status: DC

## 2019-04-17 MED ORDER — ATOMOXETINE HCL 100 MG PO CAPS: 100.0000 mg | ORAL_CAPSULE | Freq: Every day | ORAL | 1 refills | Status: DC

## 2019-04-17 NOTE — Progress Notes (Signed)
Brana Luchini [id number] [birth year] 44 y.o.  Subjective:   Patient ID:  Laura Simmons is a 44 y.o. (DOB [birth year]) female.  Chief Complaint:  Chief Complaint  Patient presents with  . ADD  . Follow-up    Anxiety, Depression, Insomnia    HPI Laura Simmons presents to the office today for follow-up of ADHD, Anxiety, depression, and insomnia. She noticed some slight improvement after increase in dose of Strattera and for the last 2 weeks feels that this improvement has diminished. She reports feeling overwhelmed by tasks and projects and not knowing where to start, "so I just leave it alone" and this leads to sad mood. She reports that she has not been able to journal or read recently due to impaired concentration. She has had vivid dreams. Denies nightmares. She reports that her anxiety is "always there." Anxiety is higher when she goes out in public. She reports depressed mood- "I'm not happy." Sleep is sporadic. Appetite is decreased when having abdominal pain. Denies SI.   Has been having difficulty with her car.   Past Psychiatric Medication Trials: Zyprexa- worsening depression Trileptal- benefits did not outweigh risks Ritalin Remeron Zoloft Celexa Lexapro Trintellix- Severe n/v Seroquel- RLS Depakote Gabapentin Propranolol-fatigue Topamax Trazodone- excessive daytime somnolence Klonopin Xanax Ambien May have taken Lamictal  PHQ2-9     Office Visit from 03/25/2016 in [facility] at [facility]  PHQ-2 Total Score  0       Review of Systems:  Review of Systems  Cardiovascular: Negative for palpitations.  Genitourinary: Positive for pelvic pain and vaginal bleeding.  Musculoskeletal: Negative for gait problem.       Shoulder pain  Psychiatric/Behavioral:       Please refer to HPI    Medications: I have reviewed the patient's current medications.  Current Outpatient Medications  Medication Sig Dispense Refill  . calcium  carbonate (TUMS) 500 MG chewable tablet Chew 1 tablet by mouth as needed for indigestion or heartburn.    Derrill Memo ON 04/19/2019] clonazePAM (KLONOPIN) 1 MG tablet TAKE 1/2 TO 1 TABLET BY MOUTH TWICE DAILY AS NEEDED FOR ANXIETY 60 tablet 1  . dimenhyDRINATE (DRAMAMINE) 50 MG tablet Take 50 mg by mouth at bedtime as needed for nausea.    Marland Kitchen docusate sodium (COLACE) 100 MG capsule Take 1 capsule (100 mg total) by mouth 2 (two) times daily. (Patient taking differently: Take 100 mg by mouth daily as needed for mild constipation. ) 30 capsule 2  . Multiple Vitamin (MULTIVITAMIN WITH MINERALS) TABS tablet Take 1 tablet by mouth daily. 30 tablet 1  . [START ON 04/20/2019] zolpidem (AMBIEN) 5 MG tablet TAKE 1 TABLET(5 MG) BY MOUTH AT BEDTIME AS NEEDED FOR SLEEP 30 tablet 1  . atomoxetine (STRATTERA) 100 MG capsule Take 1 capsule (100 mg total) by mouth daily. 30 capsule 1  . HYDROcodone-acetaminophen (NORCO/VICODIN) 5-325 MG tablet Take 1-2 tablets by mouth every 6 (six) hours as needed for severe pain. (Patient not taking: Reported on 02/20/2019) 21 tablet 0  . ondansetron (ZOFRAN) 4 MG tablet Take 1 tablet (4 mg total) by mouth 2 (two) times daily as needed for nausea or vomiting. (Patient not taking: Reported on 02/20/2019) 60 tablet 0  . pantoprazole (PROTONIX) 20 MG tablet Take 2 tablets (40 mg total) by mouth daily. 60 tablet 0  . thiamine 100 MG tablet Take 1 tablet (100 mg total) by mouth daily. (Patient not taking: Reported on 10/12/2018) 30 tablet 1   No current facility-administered  medications for this visit.    Medication Side Effects: None  Allergies:  Allergies  Allergen Reactions  . Prochlorperazine Other (See Comments)    lockjaw Other reaction(s): Other  . Sumatriptan Succinate Anaphylaxis and Other (See Comments)    ALL TRIPTAN MEDS  . Triptans Anaphylaxis and Other (See Comments)    Pt states throat feels as if it is closing.  Marland Kitchen Ketamine Other (See Comments)    "Pt. Reports she never  wants again." major hallucinations  . Reglan [Metoclopramide] Other (See Comments)    "crawling out of my skin"    Past Medical History:  Diagnosis Date  . ADHD (attention deficit hyperactivity disorder)   . Agoraphobia   . Bipolar 1 disorder ([place])    patient denies  . Chronic pain    just migraines per patient  . Endometriosis determined by laparoscopy   . GERD (gastroesophageal reflux disease)   . High platelet count   . Migraine   . Personality disorder ([place])   . Right adrenal mass ([place])    per ct 09-06-2015  . Right ovarian cyst   . Vaginal Pap smear, abnormal     Family History  Problem Relation Age of Onset  . Migraines Mother   . Anxiety disorder Mother   . Diabetes Maternal Grandmother   . Stroke Maternal Grandfather   . Heart attack Paternal Grandfather   . Anxiety disorder Paternal Aunt   . Post-traumatic stress disorder Cousin        After [place]    Social History   Socioeconomic History  . Marital status: Single    Spouse name: Not on file  . Number of children: Not on file  . Years of education: Not on file  . Highest education level: Not on file  Occupational History  . Not on file  Tobacco Use  . Smoking status: Current Every Day Smoker    Packs/day: 0.50    Years: 20.00    Pack years: 10.00    Types: Cigarettes  . Smokeless tobacco: Never Used  Substance and Sexual Activity  . Alcohol use: Yes    Comment: very rare  . Drug use: No  . Sexual activity: Not Currently    Birth control/protection: None  Other Topics Concern  . Not on file  Social History Narrative   Lives with parents in a one story home.  No children.     Currently not working.  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    Education: college   Social Determinants of Radio broadcast assistant Strain:   . Difficulty of Paying Living Expenses:   Food Insecurity:   . Worried About Charity fundraiser in the Last Year:   . Arboriculturist in the  Last Year:   Transportation Needs:   . Film/video editor (Medical):   Marland Kitchen Lack of Transportation (Non-Medical):   Physical Activity:   . Days of Exercise per Week:   . Minutes of Exercise per Session:   Stress:   . Feeling of Stress :   Social Connections:   . Frequency of Communication with Friends and Family:   . Frequency of Social Gatherings with Friends and Family:   . Attends Religious Services:   . Active Member of Clubs or Organizations:   . Attends Archivist Meetings:   Marland Kitchen Marital Status:   Intimate Partner Violence:   . Fear of Current or Ex-Partner:   . Emotionally  Abused:   Marland Kitchen Physically Abused:   . Sexually Abused:     Past Medical History, Surgical history, Social history, and Family history were reviewed and updated as appropriate.   Please see review of systems for further details on the patient's review from today.   Objective:   Physical Exam:  BP 133/90   Pulse 80   Physical Exam Constitutional:      General: She is not in acute distress. Musculoskeletal:        General: No deformity.  Neurological:     Mental Status: She is alert and oriented to person, place, and time.     Coordination: Coordination normal.  Psychiatric:        Attention and Perception: Attention and perception normal. She does not perceive auditory or visual hallucinations.        Mood and Affect: Mood is depressed. Mood is not anxious. Affect is not labile, blunt, angry or inappropriate.        Speech: Speech normal.        Behavior: Behavior normal.        Thought Content: Thought content normal. Thought content is not paranoid or delusional. Thought content does not include homicidal or suicidal ideation. Thought content does not include homicidal or suicidal plan.        Cognition and Memory: Cognition and memory normal.        Judgment: Judgment normal.     Comments: Insight intact     Lab Review:     Component Value Date/Time   NA 138 08/05/2018 0616   K  3.5 08/05/2018 0616   CL 109 08/05/2018 0616   CO2 24 08/05/2018 0616   GLUCOSE 87 08/05/2018 0616   BUN 5 (L) 08/05/2018 0616   CREATININE 0.65 08/05/2018 0616   CALCIUM 8.5 (L) 08/05/2018 0616   PROT 5.8 (L) 08/03/2018 1144   ALBUMIN 3.0 (L) 08/03/2018 1144   AST 24 08/03/2018 1144   ALT 19 08/03/2018 1144   ALKPHOS 36 (L) 08/03/2018 1144   BILITOT 0.7 08/03/2018 1144   GFRNONAA >60 08/05/2018 0616   GFRAA >60 08/05/2018 0616       Component Value Date/Time   WBC 6.5 08/06/2018 0533   RBC 4.05 08/06/2018 0533   HGB 13.0 08/06/2018 0533   HCT 37.6 08/06/2018 0533   PLT 268 08/06/2018 0533   MCV 92.8 08/06/2018 0533   MCH 32.1 08/06/2018 0533   MCHC 34.6 08/06/2018 0533   RDW 12.2 08/06/2018 0533   LYMPHSABS 2.7 08/06/2018 0533   MONOABS 0.5 08/06/2018 0533   EOSABS 0.2 08/06/2018 0533   BASOSABS 0.0 08/06/2018 0533    No results found for: POCLITH, LITHIUM   No results found for: PHENYTOIN, PHENOBARB, VALPROATE, CBMZ   .res Assessment: Plan:   Will increase Strattera to 100 mg po qd for ADHD since pt reports that she had a partial response on lower dose of Strattera.  Continue Klonopin prn anxiety.  Continue Ambien 5 mg po QHS prn insomnia.  Pt to f/u in 2 months or sooner if clinically indicated.  Patient advised to contact office with any questions, adverse effects, or acute worsening in signs and symptoms.  Laura Simmons was seen today for add and follow-up.  Diagnoses and all orders for this visit:  Attention deficit hyperactivity disorder (ADHD), combined type -     atomoxetine (STRATTERA) 100 MG capsule; Take 1 capsule (100 mg total) by mouth daily.  PTSD (post-traumatic stress disorder) -  clonazePAM (KLONOPIN) 1 MG tablet; TAKE 1/2 TO 1 TABLET BY MOUTH TWICE DAILY AS NEEDED FOR ANXIETY  Insomnia due to other mental disorder -     zolpidem (AMBIEN) 5 MG tablet; TAKE 1 TABLET(5 MG) BY MOUTH AT BEDTIME AS NEEDED FOR SLEEP     Please see After Visit  Summary for patient specific instructions.  Future Appointments  Date Time Provider [place]  06/20/2019  1:30 PM Thayer Headings, PMHNP [facility] None    No orders of the defined types were placed in this encounter.   -------------------------------

## 2019-06-20 ENCOUNTER — Ambulatory Visit: Payer: BC Managed Care – PPO | Admitting: Psychiatry

## 2019-06-20 ENCOUNTER — Telehealth: Payer: Self-pay | Admitting: Psychiatry

## 2019-06-20 NOTE — Telephone Encounter (Signed)
Evanthia called to RS her appt from today that we cancelled.  She is now scheduled for 6/21.  She called ;her pharmacy for refills on her Ambien, Strattera, and Clonazepam.  She will be out within the next 3 days.  [facility]

## 2019-06-21 ENCOUNTER — Other Ambulatory Visit: Payer: Self-pay

## 2019-06-21 DIAGNOSIS — F4310 Post-traumatic stress disorder, unspecified: Secondary | ICD-10-CM

## 2019-06-21 DIAGNOSIS — F5105 Insomnia due to other mental disorder: Secondary | ICD-10-CM

## 2019-06-21 DIAGNOSIS — F902 Attention-deficit hyperactivity disorder, combined type: Secondary | ICD-10-CM

## 2019-06-21 MED ORDER — ATOMOXETINE HCL 100 MG PO CAPS: 100.0000 mg | ORAL_CAPSULE | Freq: Every day | ORAL | 1 refills | Status: DC

## 2019-06-21 MED ORDER — ZOLPIDEM TARTRATE 5 MG PO TABS: ORAL_TABLET | ORAL | 1 refills | Status: DC

## 2019-06-21 MED ORDER — CLONAZEPAM 1 MG PO TABS: ORAL_TABLET | ORAL | 1 refills | Status: DC

## 2019-06-21 NOTE — Telephone Encounter (Signed)
Rx's refills pended for Janett Billow to submit

## 2019-07-10 ENCOUNTER — Other Ambulatory Visit: Payer: Self-pay

## 2019-07-10 ENCOUNTER — Encounter: Payer: Self-pay | Admitting: Psychiatry

## 2019-07-10 ENCOUNTER — Ambulatory Visit (INDEPENDENT_AMBULATORY_CARE_PROVIDER_SITE_OTHER): Payer: BC Managed Care – PPO | Admitting: Psychiatry

## 2019-07-10 DIAGNOSIS — F4310 Post-traumatic stress disorder, unspecified: Secondary | ICD-10-CM

## 2019-07-10 DIAGNOSIS — F5105 Insomnia due to other mental disorder: Secondary | ICD-10-CM

## 2019-07-10 DIAGNOSIS — F902 Attention-deficit hyperactivity disorder, combined type: Secondary | ICD-10-CM

## 2019-07-10 DIAGNOSIS — F99 Mental disorder, not otherwise specified: Secondary | ICD-10-CM | POA: Diagnosis not present

## 2019-07-10 MED ORDER — ATOMOXETINE HCL 100 MG PO CAPS: 100.0000 mg | ORAL_CAPSULE | Freq: Every day | ORAL | 2 refills | Status: DC

## 2019-07-10 MED ORDER — CLONAZEPAM 1 MG PO TABS: ORAL_TABLET | ORAL | 2 refills | Status: DC

## 2019-07-10 MED ORDER — ZOLPIDEM TARTRATE 5 MG PO TABS: ORAL_TABLET | ORAL | 2 refills | Status: DC

## 2019-07-10 NOTE — Progress Notes (Signed)
Laura Simmons [id number] 09-15-1975 44 y.o.  Subjective:   Patient ID:  Laura Simmons is a 44 y.o. (DOB 05/30/1975) female.  Chief Complaint:  Chief Complaint  Patient presents with  . Follow-up    h/o depression, anxiety, insomnia, and ADD    HPI Laura Simmons presents to the office today for follow-up of depression, anxiety, insomnia, and ADD. Laura Simmons reports that Laura Simmons continues to "struggle with my ADD stuff." Laura Simmons notices some initial improvement with increase in Strattera "and then it kind of leveled off. Laura Simmons reports difficulty with concentration and organization. Anxiety has been "ok" and denies recent panic attacks. Occ awakens with heart racing. Does not recall nightmares. Laura Simmons reports that her sleep has been ok. Describes mood as "ok, but I can get aggravated really quickly or I can cry at the drop of a hat quickly." Energy and motivation have been low. Denies SI.    Past Psychiatric Medication Trials: Zyprexa- worsening depression Trileptal- benefits did not outweigh risks Ritalin Remeron Zoloft Celexa Lexapro Trintellix- Severe n/v Seroquel- RLS Depakote Gabapentin Propranolol-fatigue Topamax Trazodone- excessive daytime somnolence Klonopin Xanax Ambien May have taken Lamictal     PHQ2-9     Office Visit from 03/25/2016 in [facility] at [facility]  PHQ-2 Total Score 0       Review of Systems:  Review of Systems  Cardiovascular: Negative for palpitations.  Genitourinary: Positive for menstrual problem.       Laura Simmons reports worsening endometriosis.   Musculoskeletal: Negative for gait problem.  Neurological: Positive for headaches. Negative for tremors.       Has been getting menstrual headaches.   Psychiatric/Behavioral:       Please refer to HPI  Occ night sweats and reports that this occurred prior to Strattera.  Medications: I have reviewed the patient's current medications.  Current Outpatient Medications  Medication  Sig Dispense Refill  . atomoxetine (STRATTERA) 100 MG capsule Take 1 capsule (100 mg total) by mouth daily. 30 capsule 2  . calcium carbonate (TUMS) 500 MG chewable tablet Chew 1 tablet by mouth as needed for indigestion or heartburn.    Derrill Memo ON 07/19/2019] clonazePAM (KLONOPIN) 1 MG tablet TAKE 1/2 TO 1 TABLET BY MOUTH TWICE DAILY AS NEEDED FOR ANXIETY 60 tablet 2  . dimenhyDRINATE (DRAMAMINE) 50 MG tablet Take 50 mg by mouth at bedtime as needed for nausea.    Laura Simmons Kitchen docusate sodium (COLACE) 100 MG capsule Take 1 capsule (100 mg total) by mouth 2 (two) times daily. (Patient taking differently: Take 100 mg by mouth daily as needed for mild constipation. ) 30 capsule 2  . Multiple Vitamin (MULTIVITAMIN WITH MINERALS) TABS tablet Take 1 tablet by mouth daily. 30 tablet 1  . [START ON 07/19/2019] zolpidem (AMBIEN) 5 MG tablet TAKE 1 TABLET(5 MG) BY MOUTH AT BEDTIME AS NEEDED FOR SLEEP 30 tablet 2  . pantoprazole (PROTONIX) 20 MG tablet Take 2 tablets (40 mg total) by mouth daily. 60 tablet 0   No current facility-administered medications for this visit.    Medication Side Effects: None  Allergies:  Allergies  Allergen Reactions  . Prochlorperazine Other (See Comments)    lockjaw Other reaction(s): Other  . Sumatriptan Succinate Anaphylaxis and Other (See Comments)    ALL TRIPTAN MEDS  . Triptans Anaphylaxis and Other (See Comments)    Pt states throat feels as if it is closing.  Laura Simmons Kitchen Ketamine Other (See Comments)    "Pt. Reports Laura Simmons never wants again." major hallucinations  .  Reglan [Metoclopramide] Other (See Comments)    "crawling out of my skin"    Past Medical History:  Diagnosis Date  . ADHD (attention deficit hyperactivity disorder)   . Agoraphobia   . Bipolar 1 disorder ([place])    patient denies  . Chronic pain    just migraines per patient  . Endometriosis determined by laparoscopy   . GERD (gastroesophageal reflux disease)   . High platelet count   . Migraine   . Personality  disorder ([place])   . Right adrenal mass ([place])    per ct 09-06-2015  . Right ovarian cyst   . Vaginal Pap smear, abnormal     Family History  Problem Relation Age of Onset  . Migraines Mother   . Anxiety disorder Mother   . Diabetes Maternal Grandmother   . Stroke Maternal Grandfather   . Heart attack Paternal Grandfather   . Anxiety disorder Paternal Aunt   . Post-traumatic stress disorder Cousin        After [place]    Social History   Socioeconomic History  . Marital status: Single    Spouse name: Not on file  . Number of children: Not on file  . Years of education: Not on file  . Highest education level: Not on file  Occupational History  . Not on file  Tobacco Use  . Smoking status: Current Every Day Smoker    Packs/day: 0.50    Years: 20.00    Pack years: 10.00    Types: Cigarettes  . Smokeless tobacco: Never Used  Substance and Sexual Activity  . Alcohol use: Yes    Comment: very rare  . Drug use: No  . Sexual activity: Not Currently    Birth control/protection: None  Other Topics Concern  . Not on file  Social History Narrative   Lives with parents in a one story home.  No children.     Currently not working.  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    Education: college   Social Determinants of Radio broadcast assistant Strain:   . Difficulty of Paying Living Expenses:   Food Insecurity:   . Worried About Charity fundraiser in the Last Year:   . Arboriculturist in the Last Year:   Transportation Needs:   . Film/video editor (Medical):   Laura Simmons Kitchen Lack of Transportation (Non-Medical):   Physical Activity:   . Days of Exercise per Week:   . Minutes of Exercise per Session:   Stress:   . Feeling of Stress :   Social Connections:   . Frequency of Communication with Friends and Family:   . Frequency of Social Gatherings with Friends and Family:   . Attends Religious Services:   . Active Member of Clubs or Organizations:   .  Attends Archivist Meetings:   Laura Simmons Kitchen Marital Status:   Intimate Partner Violence:   . Fear of Current or Ex-Partner:   . Emotionally Abused:   Laura Simmons Kitchen Physically Abused:   . Sexually Abused:     Past Medical History, Surgical history, Social history, and Family history were reviewed and updated as appropriate.   Please see review of systems for further details on the patient's review from today.   Objective:   Physical Exam:  BP (!) 159/93   Pulse 87   Physical Exam Constitutional:      General: Laura Simmons is not in acute distress. Musculoskeletal:  General: No deformity.  Neurological:     Mental Status: Laura Simmons is alert and oriented to person, place, and time.     Coordination: Coordination normal.  Psychiatric:        Attention and Perception: Attention and perception normal. Laura Simmons does not perceive auditory or visual hallucinations.        Mood and Affect: Mood normal. Mood is not anxious or depressed. Affect is not labile, blunt, angry or inappropriate.        Speech: Speech normal.        Behavior: Behavior normal.        Thought Content: Thought content normal. Thought content is not paranoid or delusional. Thought content does not include homicidal or suicidal ideation. Thought content does not include homicidal or suicidal plan.        Cognition and Memory: Cognition and memory normal.        Judgment: Judgment normal.     Comments: Insight intact     Lab Review:     Component Value Date/Time   NA 138 08/05/2018 0616   K 3.5 08/05/2018 0616   CL 109 08/05/2018 0616   CO2 24 08/05/2018 0616   GLUCOSE 87 08/05/2018 0616   BUN 5 (L) 08/05/2018 0616   CREATININE 0.65 08/05/2018 0616   CALCIUM 8.5 (L) 08/05/2018 0616   PROT 5.8 (L) 08/03/2018 1144   ALBUMIN 3.0 (L) 08/03/2018 1144   AST 24 08/03/2018 1144   ALT 19 08/03/2018 1144   ALKPHOS 36 (L) 08/03/2018 1144   BILITOT 0.7 08/03/2018 1144   GFRNONAA >60 08/05/2018 0616   GFRAA >60 08/05/2018 0616        Component Value Date/Time   WBC 6.5 08/06/2018 0533   RBC 4.05 08/06/2018 0533   HGB 13.0 08/06/2018 0533   HCT 37.6 08/06/2018 0533   PLT 268 08/06/2018 0533   MCV 92.8 08/06/2018 0533   MCH 32.1 08/06/2018 0533   MCHC 34.6 08/06/2018 0533   RDW 12.2 08/06/2018 0533   LYMPHSABS 2.7 08/06/2018 0533   MONOABS 0.5 08/06/2018 0533   EOSABS 0.2 08/06/2018 0533   BASOSABS 0.0 08/06/2018 0533    No results found for: POCLITH, LITHIUM   No results found for: PHENYTOIN, PHENOBARB, VALPROATE, CBMZ   .res Assessment: Plan:   Will continue current plan of care without changes at this time.  Recommended considering seeing a gynecologist regarding worsening endometriosis and menstrual migraine. Continue Klonopin 1 mg 1/2 to 1 tablet twice daily as needed for anxiety. Continue Strattera 100 mg daily for attention deficit disorder. Continue Ambien 5 mg as needed for insomnia. Patient to follow-up in 3 months or sooner if clinically indicated. Patient advised to contact office with any questions, adverse effects, or acute worsening in signs and symptoms.  Faithe was seen today for follow-up.  Diagnoses and all orders for this visit:  PTSD (post-traumatic stress disorder) -     clonazePAM (KLONOPIN) 1 MG tablet; TAKE 1/2 TO 1 TABLET BY MOUTH TWICE DAILY AS NEEDED FOR ANXIETY  Attention deficit hyperactivity disorder (ADHD), combined type -     atomoxetine (STRATTERA) 100 MG capsule; Take 1 capsule (100 mg total) by mouth daily.  Insomnia due to other mental disorder -     zolpidem (AMBIEN) 5 MG tablet; TAKE 1 TABLET(5 MG) BY MOUTH AT BEDTIME AS NEEDED FOR SLEEP     Please see After Visit Summary for patient specific instructions.  Future Appointments  Date Time Provider [place]  10/10/2019  1:00 PM Thayer Headings, PMHNP [facility] None    No orders of the defined types were placed in this encounter.   -------------------------------

## 2019-10-10 ENCOUNTER — Ambulatory Visit (INDEPENDENT_AMBULATORY_CARE_PROVIDER_SITE_OTHER): Payer: BC Managed Care – PPO | Admitting: Psychiatry

## 2019-10-10 ENCOUNTER — Encounter: Payer: Self-pay | Admitting: Psychiatry

## 2019-10-10 ENCOUNTER — Other Ambulatory Visit: Payer: Self-pay

## 2019-10-10 DIAGNOSIS — F99 Mental disorder, not otherwise specified: Secondary | ICD-10-CM

## 2019-10-10 DIAGNOSIS — F4310 Post-traumatic stress disorder, unspecified: Secondary | ICD-10-CM

## 2019-10-10 DIAGNOSIS — F5105 Insomnia due to other mental disorder: Secondary | ICD-10-CM

## 2019-10-10 DIAGNOSIS — F902 Attention-deficit hyperactivity disorder, combined type: Secondary | ICD-10-CM

## 2019-10-10 DIAGNOSIS — F329 Major depressive disorder, single episode, unspecified: Secondary | ICD-10-CM

## 2019-10-10 DIAGNOSIS — F32A Depression, unspecified: Secondary | ICD-10-CM

## 2019-10-10 MED ORDER — ATOMOXETINE HCL 100 MG PO CAPS: 100.0000 mg | ORAL_CAPSULE | Freq: Every day | ORAL | 2 refills | Status: DC

## 2019-10-10 MED ORDER — CLONAZEPAM 1 MG PO TABS: ORAL_TABLET | ORAL | 2 refills | Status: DC

## 2019-10-10 MED ORDER — ZOLPIDEM TARTRATE 5 MG PO TABS: ORAL_TABLET | ORAL | 2 refills | Status: DC

## 2019-10-10 MED ORDER — REXULTI 0.5 MG PO TABS: 0.5000 mg | ORAL_TABLET | Freq: Every day | ORAL | 0 refills | Status: DC

## 2019-10-10 NOTE — Progress Notes (Signed)
Laura Simmons [id number] [birth year] 44 y.o.  Subjective:   Patient ID:  Laura Simmons is a 44 y.o. (DOB 06-03-1975) female.  Chief Complaint:  Chief Complaint  Patient presents with  . Depression  . Anxiety    HPI Laura Simmons presents to the office today for follow-up of PTSD, depression, and insomnia. Reports that her mother had been having difficulty breathing and had pacemaker placed. Mother has continued to have severe respiratory issues and is now dealing with medication side effects for treatment of respiratory issues. Father is also continuing to have health issues.   Pt reports depressed mood. She reports mood has been lower since job opportunity did not work out. She reports that she has been socially withdrawn and avoiding friend's phone calls. Report motivation is low and feels easily overwhelmed. Occ intrusive thoughts. Some intrusive images of past traumatic events. Denies any recent panic attacks. She reports that she has had about #15 Klonopin remaining at the end of the month. She reports that she has been missing her fiance and her dogs and experiencing feelings of loneliness. Reports gaining at least 15 lbs in the last 2 months. Appetite has been increased for chocolate and certain snacks. Energy has been low. Sleep has been inconsistent. Sleep qty is usually 7-8 hours a night. Concentration is poor. Denies SI. Reports that she has had intrusive thoughts about things related to death.   She reports that she continues to notices worsening mood and anxiety prior to onset of menses.   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 She reports that she was then told that they needed someone to start immediately and she was unable to start right away.   Past Psychiatric Medication Trials: Zyprexa- worsening depression Trileptal- benefits did not outweigh  risks Ritalin Remeron Zoloft Celexa Lexapro Trintellix- Severe n/v Wellbutrin Nortriptyline Strattera Seroquel- RLS Depakote Gabapentin Propranolol-fatigue Topamax Trazodone- excessive daytime somnolence Klonopin Xanax Ativan Ambien May have taken Lamictal  PHQ2-9     Office Visit from 03/25/2016 in [facility] at [facility]  PHQ-2 Total Score 0       Review of Systems:  Review of Systems  Cardiovascular: Negative for chest pain and palpitations.  Genitourinary: Positive for menstrual problem, pelvic pain and vaginal bleeding.  Musculoskeletal: Negative for gait problem.       Shoulder pain  Neurological: Negative for tremors.  Psychiatric/Behavioral:       Please refer to HPI    Medications: I have reviewed the patient's current medications.  Current Outpatient Medications  Medication Sig Dispense Refill  . atomoxetine (STRATTERA) 100 MG capsule Take 1 capsule (100 mg total) by mouth daily. 30 capsule 2  . calcium carbonate (TUMS) 500 MG chewable tablet Chew 1 tablet by mouth as needed for indigestion or heartburn.    . clonazePAM (KLONOPIN) 1 MG tablet TAKE 1/2 TO 1 TABLET BY MOUTH TWICE DAILY AS NEEDED FOR ANXIETY 60 tablet 2  . dimenhyDRINATE (DRAMAMINE) 50 MG tablet Take 50 mg by mouth at bedtime as needed for nausea.    Marland Kitchen docusate sodium (COLACE) 100 MG capsule Take 1 capsule (100 mg total) by mouth 2 (two) times daily. (Patient taking differently: Take 100 mg by mouth daily as needed for mild constipation. ) 30 capsule 2  . Multiple Vitamin (MULTIVITAMIN WITH MINERALS) TABS tablet Take 1 tablet by mouth daily. 30 tablet 1  . zolpidem (AMBIEN) 5  MG tablet TAKE 1 TABLET(5 MG) BY MOUTH AT BEDTIME AS NEEDED FOR SLEEP 30 tablet 2  . Brexpiprazole (REXULTI) 0.5 MG TABS Take 1 tablet (0.5 mg total) by mouth daily. 30 tablet 0  . pantoprazole (PROTONIX) 20 MG tablet Take 2 tablets (40 mg total) by mouth daily. (Patient taking differently:  Take 20 mg by mouth daily. ) 60 tablet 0   No current facility-administered medications for this visit.    Medication Side Effects: None  Allergies:  Allergies  Allergen Reactions  . Prochlorperazine Other (See Comments)    lockjaw Other reaction(s): Other  . Sumatriptan Succinate Anaphylaxis and Other (See Comments)    ALL TRIPTAN MEDS  . Triptans Anaphylaxis and Other (See Comments)    Pt states throat feels as if it is closing.  Marland Kitchen Ketamine Other (See Comments)    "Pt. Reports she never wants again." major hallucinations  . Reglan [Metoclopramide] Other (See Comments)    "crawling out of my skin"    Past Medical History:  Diagnosis Date  . ADHD (attention deficit hyperactivity disorder)   . Agoraphobia   . Bipolar 1 disorder ([place])    patient denies  . Chronic pain    just migraines per patient  . Endometriosis determined by laparoscopy   . GERD (gastroesophageal reflux disease)   . High platelet count   . Migraine   . Personality disorder ([place])   . Right adrenal mass ([place])    per ct 09-06-2015  . Right ovarian cyst   . Vaginal Pap smear, abnormal     Family History  Problem Relation Age of Onset  . Migraines Mother   . Anxiety disorder Mother   . Diabetes Maternal Grandmother   . Stroke Maternal Grandfather   . Heart attack Paternal Grandfather   . Anxiety disorder Paternal Aunt   . Post-traumatic stress disorder Cousin        After [place]    Social History   Socioeconomic History  . Marital status: Single    Spouse name: Not on file  . Number of children: Not on file  . Years of education: Not on file  . Highest education level: Not on file  Occupational History  . Not on file  Tobacco Use  . Smoking status: Current Every Day Smoker    Packs/day: 0.50    Years: 20.00    Pack years: 10.00    Types: Cigarettes  . Smokeless tobacco: Never Used  Substance and Sexual Activity  . Alcohol use: Yes    Comment: very rare  . Drug use: No  .  Sexual activity: Not Currently    Birth control/protection: None  Other Topics Concern  . Not on file  Social History Narrative   Lives with parents in a one story home.  No children.     Currently not working.  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    Education: college   Social Determinants of Radio broadcast assistant Strain:   . Difficulty of Paying Living Expenses: Not on file  Food Insecurity:   . Worried About Charity fundraiser in the Last Year: Not on file  . Ran Out of Food in the Last Year: Not on file  Transportation Needs:   . Lack of Transportation (Medical): Not on file  . Lack of Transportation (Non-Medical): Not on file  Physical Activity:   . Days of Exercise per Week: Not on file  . Minutes of Exercise  per Session: Not on file  Stress:   . Feeling of Stress : Not on file  Social Connections:   . Frequency of Communication with Friends and Family: Not on file  . Frequency of Social Gatherings with Friends and Family: Not on file  . Attends Religious Services: Not on file  . Active Member of Clubs or Organizations: Not on file  . Attends Archivist Meetings: Not on file  . Marital Status: Not on file  Intimate Partner Violence:   . Fear of Current or Ex-Partner: Not on file  . Emotionally Abused: Not on file  . Physically Abused: Not on file  . Sexually Abused: Not on file    Past Medical History, Surgical history, Social history, and Family history were reviewed and updated as appropriate.   Please see review of systems for further details on the patient's review from today.   Objective:   Physical Exam:  There were no vitals taken for this visit.  Physical Exam Constitutional:      General: She is not in acute distress. Musculoskeletal:        General: No deformity.  Neurological:     Mental Status: She is alert and oriented to person, place, and time.     Coordination: Coordination normal.  Psychiatric:         Attention and Perception: Attention and perception normal. She does not perceive auditory or visual hallucinations.        Mood and Affect: Mood is anxious and depressed. Affect is tearful. Affect is not labile, blunt, angry or inappropriate.        Speech: Speech normal.        Behavior: Behavior normal.        Thought Content: Thought content normal. Thought content is not paranoid or delusional. Thought content does not include homicidal or suicidal ideation. Thought content does not include homicidal or suicidal plan.        Cognition and Memory: Cognition and memory normal.        Judgment: Judgment normal.     Comments: Insight intact     Lab Review:     Component Value Date/Time   NA 138 08/05/2018 0616   K 3.5 08/05/2018 0616   CL 109 08/05/2018 0616   CO2 24 08/05/2018 0616   GLUCOSE 87 08/05/2018 0616   BUN 5 (L) 08/05/2018 0616   CREATININE 0.65 08/05/2018 0616   CALCIUM 8.5 (L) 08/05/2018 0616   PROT 5.8 (L) 08/03/2018 1144   ALBUMIN 3.0 (L) 08/03/2018 1144   AST 24 08/03/2018 1144   ALT 19 08/03/2018 1144   ALKPHOS 36 (L) 08/03/2018 1144   BILITOT 0.7 08/03/2018 1144   GFRNONAA >60 08/05/2018 0616   GFRAA >60 08/05/2018 0616       Component Value Date/Time   WBC 6.5 08/06/2018 0533   RBC 4.05 08/06/2018 0533   HGB 13.0 08/06/2018 0533   HCT 37.6 08/06/2018 0533   PLT 268 08/06/2018 0533   MCV 92.8 08/06/2018 0533   MCH 32.1 08/06/2018 0533   MCHC 34.6 08/06/2018 0533   RDW 12.2 08/06/2018 0533   LYMPHSABS 2.7 08/06/2018 0533   MONOABS 0.5 08/06/2018 0533   EOSABS 0.2 08/06/2018 0533   BASOSABS 0.0 08/06/2018 0533    No results found for: POCLITH, LITHIUM   No results found for: PHENYTOIN, PHENOBARB, VALPROATE, CBMZ   .res Assessment: Plan:   Discussed potential benefits, risks, and side effects of Rexulti. Discussed potential metabolic side  effects associated with atypical antipsychotics, as well as potential risk for movement side effects. Advised  pt to contact office if movement side effects occur.  Will start Rexulti 0.5 mg daily for treatment of depression. Discussed that Rexulti is not approved for anxiety, however Rexulti can occasionally be helpful for anxiety signs and symptoms. Continue Strattera 100 mg daily for attention deficit disorder. Continue Klonopin 1 mg 1/2 to 1 tablet twice daily as needed for anxiety. Continue Ambien 5 mg at bedtime as needed for insomnia. Discussed considering restarting therapy and patient reports that she will consider this and notify office when she is ready to resume therapy. Patient to follow-up in approximately 4 weeks or sooner if clinically indicated. Patient advised to contact office with any questions, adverse effects, or acute worsening in signs and symptoms.  Laura Simmons was seen today for depression and anxiety.  Diagnoses and all orders for this visit:  Depression, unspecified depression type -     Brexpiprazole (REXULTI) 0.5 MG TABS; Take 1 tablet (0.5 mg total) by mouth daily.  Attention deficit hyperactivity disorder (ADHD), combined type -     atomoxetine (STRATTERA) 100 MG capsule; Take 1 capsule (100 mg total) by mouth daily.  PTSD (post-traumatic stress disorder) -     clonazePAM (KLONOPIN) 1 MG tablet; TAKE 1/2 TO 1 TABLET BY MOUTH TWICE DAILY AS NEEDED FOR ANXIETY  Insomnia due to other mental disorder -     zolpidem (AMBIEN) 5 MG tablet; TAKE 1 TABLET(5 MG) BY MOUTH AT BEDTIME AS NEEDED FOR SLEEP     Please see After Visit Summary for patient specific instructions.  Future Appointments  Date Time Provider [place]  11/01/2019  9:30 AM Thayer Headings, PMHNP CP-[facility] None    No orders of the defined types were placed in this encounter.   -------------------------------

## 2019-11-01 ENCOUNTER — Ambulatory Visit: Payer: BC Managed Care – PPO | Admitting: Psychiatry

## 2019-12-25 ENCOUNTER — Telehealth: Payer: Self-pay | Admitting: Family Medicine

## 2019-12-25 NOTE — Telephone Encounter (Signed)
Called left message to call back 

## 2019-12-25 NOTE — Telephone Encounter (Signed)
Called and scheduled CPE

## 2019-12-25 NOTE — Telephone Encounter (Signed)
I haven't see her in over a year. She should sched a physical, we can discuss referral/options at that time. Ty.

## 2019-12-25 NOTE — Telephone Encounter (Signed)
Caller: Mother, Earlie Server  Call back number: [phone] or [phone]  Wanting to get referral for daughter for GI.  States daughter is having hard time eating & swallowing... difficult since Thursday.    Mother would love daughter to be seen by Dr. Carlean Purl [phone]   Please advise

## 2019-12-26 ENCOUNTER — Ambulatory Visit (INDEPENDENT_AMBULATORY_CARE_PROVIDER_SITE_OTHER): Payer: BC Managed Care – PPO | Admitting: Family Medicine

## 2019-12-26 ENCOUNTER — Encounter: Payer: Self-pay | Admitting: Family Medicine

## 2019-12-26 ENCOUNTER — Other Ambulatory Visit: Payer: Self-pay

## 2019-12-26 VITALS — BP 138/92 | HR 90 | Temp 98.0°F | Ht 64.0 in | Wt 134.1 lb

## 2019-12-26 DIAGNOSIS — R519 Headache, unspecified: Secondary | ICD-10-CM

## 2019-12-26 DIAGNOSIS — Z1211 Encounter for screening for malignant neoplasm of colon: Secondary | ICD-10-CM | POA: Diagnosis not present

## 2019-12-26 DIAGNOSIS — Z1159 Encounter for screening for other viral diseases: Secondary | ICD-10-CM | POA: Diagnosis not present

## 2019-12-26 DIAGNOSIS — K222 Esophageal obstruction: Secondary | ICD-10-CM

## 2019-12-26 DIAGNOSIS — N809 Endometriosis, unspecified: Secondary | ICD-10-CM

## 2019-12-26 DIAGNOSIS — R030 Elevated blood-pressure reading, without diagnosis of hypertension: Secondary | ICD-10-CM | POA: Diagnosis not present

## 2019-12-26 DIAGNOSIS — L989 Disorder of the skin and subcutaneous tissue, unspecified: Secondary | ICD-10-CM

## 2019-12-26 DIAGNOSIS — Z23 Encounter for immunization: Secondary | ICD-10-CM

## 2019-12-26 DIAGNOSIS — Z0001 Encounter for general adult medical examination with abnormal findings: Secondary | ICD-10-CM

## 2019-12-26 DIAGNOSIS — G8929 Other chronic pain: Secondary | ICD-10-CM

## 2019-12-26 MED ORDER — NORGESTIMATE-ETH ESTRADIOL 0.25-35 MG-MCG PO TABS: 1.0000 | ORAL_TABLET | Freq: Every day | ORAL | 11 refills | Status: DC

## 2019-12-26 MED ORDER — TRIAMCINOLONE ACETONIDE 0.1 % EX CREA: 1.0000 "application " | TOPICAL_CREAM | Freq: Two times a day (BID) | CUTANEOUS | 0 refills | Status: DC

## 2019-12-26 MED ORDER — BUTALBITAL-APAP-CAFFEINE 50-325-40 MG PO TABS: 1.0000 | ORAL_TABLET | Freq: Four times a day (QID) | ORAL | 0 refills | Status: DC | PRN

## 2019-12-26 NOTE — Progress Notes (Signed)
Chief Complaint  Patient presents with  . Annual Exam     Well Woman Laura Simmons is here for a complete physical.   Her last physical was >1 year ago.  Current diet: in general, a "healthy" diet. Current exercise: none. Weight is increasing and she confirms fatigue out of ordinary. Seatbelt? Yes  Health Maintenance Pap/HPV- No Mammogram- No Tetanus- No Hep C screening- No HIV screening- No   Last week, the patient had 3 days of difficulty swallowing.  She felt like food was getting stuck and traveling slowly in her mid and lower esophagus.  She had an upper endoscopy in the past that showed some narrowing but no therapy was performed.  She is not losing weight and denies any nausea or vomiting.  Bowel movements are largely unchanged.  The patient has a history of endometriosis reportedly diagnosed with laparoscopy.  She tried a hormonal intrauterine device that was not particularly helpful as it was unable to stay in.  She does not currently have an OB/GYN.  She is not on any hormonal treatment at this time.  The patient also has a history of headaches that seem to be worse with hormonal changes.  Fioricet has worked well for abortive therapy in the past.  She is not on any maintenance therapy at this time.  She does not currently have a headache.  The patient also notes a skin lesion on her right anterior shoulder for the past month that is slightly growing in size.  It is scaly.  It is not itchy, draining, or painful.  No new soaps, lotions, detergents, or other topicals.  She has not tried anything at home.  Somewhat seems to make it better.  Past Medical History:  Diagnosis Date  . ADHD (attention deficit hyperactivity disorder)   . Agoraphobia   . Bipolar 1 disorder ([place])    patient denies  . Chronic pain    just migraines per patient  . Endometriosis determined by laparoscopy   . GERD (gastroesophageal reflux disease)   . High platelet count   . Migraine   .  Personality disorder ([place])   . Right adrenal mass ([place])    per ct 09-06-2015  . Right ovarian cyst   . Vaginal Pap smear, abnormal      Past Surgical History:  Procedure Laterality Date  . COLONOSCOPY  2012  . HYSTEROSCOPY WITH D & C  10/23/2015   Procedure: DILATATION AND CURETTAGE /HYSTEROSCOPY;  Surgeon: [name], MD;  Location: [facility];  Service: Gynecology;;  . INTRAUTERINE DEVICE (IUD) INSERTION N/A 10/23/2015   Procedure: INTRAUTERINE DEVICE (IUD) INSERTION with dilation of cervix with ultrasound guidance;  Surgeon: [name], MD;  Location: [facility];  Service: Gynecology;  Laterality: N/A;  . LAPAROSCOPIC OVARIAN CYSTECTOMY Right 09/19/2015   Procedure: LAPAROSCOPIC OVARIAN CYSTECTOMY; CAUTERIZATION OF ENDOMETRIAL LESIONS; ENDOMETRIAL CURRETINGS;  Surgeon: [name], MD;  Location: [facility];  Service: Gynecology;  Laterality: Right;    Medications  Current Outpatient Medications on File Prior to Visit  Medication Sig Dispense Refill  . atomoxetine (STRATTERA) 100 MG capsule Take 1 capsule (100 mg total) by mouth daily. 30 capsule 2  . calcium carbonate (TUMS) 500 MG chewable tablet Chew 1 tablet by mouth as needed for indigestion or heartburn.    . clonazePAM (KLONOPIN) 1 MG tablet TAKE 1/2 TO 1 TABLET BY MOUTH TWICE DAILY AS NEEDED FOR ANXIETY 60 tablet 2  . dimenhyDRINATE (DRAMAMINE) 50 MG tablet Take  50 mg by mouth at bedtime as needed for nausea.    Marland Kitchen docusate sodium (COLACE) 100 MG capsule Take 1 capsule (100 mg total) by mouth 2 (two) times daily. (Patient taking differently: Take 100 mg by mouth daily as needed for mild constipation. ) 30 capsule 2  . Multiple Vitamin (MULTIVITAMIN WITH MINERALS) TABS tablet Take 1 tablet by mouth daily. 30 tablet 1  . pantoprazole (PROTONIX) 20 MG tablet Take 2 tablets (40 mg total) by mouth daily. (Patient taking differently: Take 20 mg by mouth daily. ) 60 tablet 0  . zolpidem (AMBIEN) 5 MG  tablet TAKE 1 TABLET(5 MG) BY MOUTH AT BEDTIME AS NEEDED FOR SLEEP 30 tablet 2   Allergies Allergies  Allergen Reactions  . Prochlorperazine Other (See Comments)    lockjaw Other reaction(s): Other  . Sumatriptan Succinate Anaphylaxis and Other (See Comments)    ALL TRIPTAN MEDS  . Triptans Anaphylaxis and Other (See Comments)    Pt states throat feels as if it is closing.  Marland Kitchen Ketamine Other (See Comments)    "Pt. Reports she never wants again." major hallucinations  . Reglan [Metoclopramide] Other (See Comments)    "crawling out of my skin"    Review of Systems: Constitutional:  no unexpected weight changes Eye:  no recent significant change in vision Ear/Nose/Mouth/Throat:  Ears:  no recent change in hearing Nose/Mouth/Throat:  no complaints of nasal congestion, no sore throat Cardiovascular: no chest pain Respiratory:  no shortness of breath Gastrointestinal:  + Difficulty swallowing GU:  Female: +pelvic pain Musculoskeletal/Extremities:  no pain of the joints Integumentary (Skin/Breast):  + Scaly patch on the anterior right shoulder Neurologic:  + headaches Endocrine:  denies fatigue Hematologic/Lymphatic:  No areas of easy bleeding  Exam BP (!) 138/92 (BP Location: Left Arm, Patient Position: Sitting, Cuff Size: Normal)   Pulse 90   Temp 98 F (36.7 C) (Oral)   Ht 5\' 4"  (1.626 m)   Wt 134 lb 2 oz (60.8 kg)   SpO2 99%   BMI 23.02 kg/m  General:  well developed, well nourished, in no apparent distress Skin: On the right anterior shoulder, there is a scaly and circular patch of skin with baseline erythema.  It is approximately 3 cm in diameter there is no fluctuance, streaking redness, drainage, or excessive warmth.  Otherwise no significant moles, warts, or growths Head:  no masses, lesions, or tenderness Eyes:  pupils equal and round, sclera anicteric without injection Ears:  canals without lesions, TMs shiny without retraction, no obvious effusion, no  erythema Nose:  nares patent, septum midline, mucosa normal, and no drainage or sinus tenderness Throat/Pharynx:  lips and gingiva without lesion; tongue and uvula midline; non-inflamed pharynx; no exudates or postnasal drainage Neck: neck supple without adenopathy, thyromegaly, or masses Lungs:  clear to auscultation, breath sounds equal bilaterally, no respiratory distress Cardio:  regular rate and rhythm, no LE edema Abdomen:  abdomen soft, tender in the lower quadrants bilaterally; bowel sounds normal; no masses or organomegaly Genital: Defer to GYN Musculoskeletal:  symmetrical muscle groups noted without atrophy or deformity Extremities:  no clubbing, cyanosis, or edema, no deformities, no skin discoloration Neuro:  gait normal; deep tendon reflexes normal and symmetric Psych: well oriented with normal range of affect and appropriate judgment/insight  Assessment and Plan  Encounter for well adult exam with abnormal findings - Plan: Comprehensive metabolic panel, Lipid panel  Esophageal stricture  Screen for colon cancer - Plan: Ambulatory referral to Gastroenterology  Endometriosis -  Plan: norgestimate-ethinyl estradiol (SPRINTEC 28) 0.25-35 MG-MCG tablet  Skin lesion - Plan: triamcinolone (KENALOG) 0.1 %  Encounter for hepatitis C screening test for low risk patient - Plan: Hepatitis C antibody  Elevated blood pressure reading  Need for Tdap vaccination - Plan: Tdap vaccine greater than or equal to 7yo IM  Chronic nonintractable headache, unspecified headache type - Plan: butalbital-acetaminophen-caffeine (FIORICET) 50-325-40 MG tablet   Well 44 y.o. female. Counseled on diet and exercise. Other orders as above. For her history of stricture, will refer to [facility]ology. I did give her the OB/GYN contact information for across the hall, would recommend she see Dr. Purvis Kilts.  I did start an oral contraceptive today.  This may help with her headaches.  I did  call in Fioricet for abortive therapy. She will monitor her blood pressure at home.  If it is still elevated, she will let us know and we will follow-up. For skin lesion, I am concerned with psoriasis.  I will send in a topical steroid cream.  If no better, I will see her in a month and we will consider a biopsy. Follow up in 1 year or as needed. The patient voiced understanding and agreement to the plan.  [place], DO 12/26/19 4:59 PM

## 2019-12-26 NOTE — Patient Instructions (Addendum)
Call Center for [place] at [facility] at [phone] for an appointment.  They are located at [address], [place], [place], [address] (right across the hall from our office).   Ask for Dr. Ihor Dow.   Give [name] 2-3 business days to get the results of your labs back.   Keep the diet clean and stay active.  Come in back in a month if skin lesion is not better.  If you do not hear anything about your referral in the next 1-2 weeks, call our office and ask for an update.  Your blood pressure was high when I rechecked it too. Keep an eye on it at home. If >140/90 consistently, please schedule an appointment.   Let us know if you need anything.

## 2019-12-27 LAB — COMPREHENSIVE METABOLIC PANEL
ALT: 8 U/L (ref 0–35)
AST: 13 U/L (ref 0–37)
Albumin: 4.5 g/dL (ref 3.5–5.2)
Alkaline Phosphatase: 60 U/L (ref 39–117)
BUN: 12 mg/dL (ref 6–23)
CO2: 30 mEq/L (ref 19–32)
Calcium: 10.6 mg/dL — ABNORMAL HIGH (ref 8.4–10.5)
Chloride: 98 mEq/L (ref 96–112)
Creatinine, Ser: 0.87 mg/dL (ref 0.40–1.20)
GFR: 80.58 mL/min (ref >60.00)
Glucose, Bld: 75 mg/dL (ref 70–99)
Potassium: 4.6 mEq/L (ref 3.5–5.1)
Sodium: 138 mEq/L (ref 135–145)
Total Bilirubin: 0.3 mg/dL (ref 0.2–1.2)
Total Protein: 7.6 g/dL (ref 6.0–8.3)

## 2019-12-27 LAB — LIPID PANEL
Cholesterol: 251 mg/dL — ABNORMAL HIGH (ref 0–200)
HDL: 76.8 mg/dL (ref >39.00)
LDL Cholesterol: 159 mg/dL — ABNORMAL HIGH (ref 0–99)
NonHDL: 174.35
Total CHOL/HDL Ratio: 3
Triglycerides: 75 mg/dL (ref 0.0–149.0)
VLDL: 15 mg/dL (ref 0.0–40.0)

## 2019-12-27 LAB — HEPATITIS C ANTIBODY
Hepatitis C Ab: NONREACTIVE
SIGNAL TO CUT-OFF: 0.01 (ref <1.00)

## 2019-12-28 ENCOUNTER — Other Ambulatory Visit: Payer: Self-pay | Admitting: *Deleted

## 2019-12-28 ENCOUNTER — Encounter: Payer: Self-pay | Admitting: *Deleted

## 2019-12-28 DIAGNOSIS — E785 Hyperlipidemia, unspecified: Secondary | ICD-10-CM

## 2020-01-16 ENCOUNTER — Telehealth: Payer: Self-pay | Admitting: Psychiatry

## 2020-01-16 DIAGNOSIS — F4310 Post-traumatic stress disorder, unspecified: Secondary | ICD-10-CM

## 2020-01-16 DIAGNOSIS — F5105 Insomnia due to other mental disorder: Secondary | ICD-10-CM

## 2020-01-16 MED ORDER — ZOLPIDEM TARTRATE 5 MG PO TABS: ORAL_TABLET | ORAL | 2 refills | Status: DC

## 2020-01-16 MED ORDER — CLONAZEPAM 1 MG PO TABS: ORAL_TABLET | ORAL | 2 refills | Status: DC

## 2020-01-16 NOTE — Telephone Encounter (Signed)
Received refill request from pharmacy for Klonopin and Ambien.  Refill sent.

## 2020-03-01 ENCOUNTER — Ambulatory Visit (INDEPENDENT_AMBULATORY_CARE_PROVIDER_SITE_OTHER): Payer: BC Managed Care – PPO | Admitting: Psychiatry

## 2020-03-01 ENCOUNTER — Other Ambulatory Visit: Payer: Self-pay

## 2020-03-01 ENCOUNTER — Encounter: Payer: Self-pay | Admitting: Psychiatry

## 2020-03-01 DIAGNOSIS — F902 Attention-deficit hyperactivity disorder, combined type: Secondary | ICD-10-CM | POA: Diagnosis not present

## 2020-03-01 MED ORDER — QELBREE 200 MG PO CP24: 400.0000 mg | ORAL_CAPSULE | Freq: Every day | ORAL | 1 refills | Status: DC

## 2020-03-01 MED ORDER — QELBREE 200 MG PO CP24: ORAL_CAPSULE | ORAL | 0 refills | Status: DC

## 2020-03-01 NOTE — Progress Notes (Signed)
Laura Simmons [id number] 12-09-1975 45 y.o.  Subjective:   Patient ID:  Laura Simmons is a 45 y.o. (DOB Sep 14, 1975) female.  Chief Complaint:  Chief Complaint  Patient presents with  . Depression  . Anxiety  . ADD    HPI Laura Simmons presents to the office today for follow-up of depression, anxiety, insomnia, and ADHD.  Marland Kitchen  She reports that she has been feeling "sad." She reports, "I'm thinking the worst of everything." She reports having a panic attack in response to thinking about losing her father. Low motivation. She reports low self esteem. She reports that she continues to miss her fiance and dogs that have died. Reports feeling frequently overwhelmed and describes her room as chaotic. Anxiety has been elevated. Sleep has been "all over the place." She reports that some nights she can sleep 12 hours and other nights she has difficulty with sleep. No longer journaling. She reports that she is frequently losing her train of thought. Denies any impulsivity or risky behavior. Denies SI.   She reports that she took samples of Rexulti for one month and thought she noticed some slight improvement in mood. She stopped taking it due to concerns about elevated cholesterol.   She reports that she weaned off Strattera due to no significant improvement. Taking Ambien prn  Reports minimal social interaction. Closest friends have been working extra hours and are not as available. Turns phone off to avoid interaction.   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  Living with parents and is currently unemployed. Mother has been doing ok. Helping take care of father.   Past Psychiatric Medication Trials: Zyprexa- worsening depression Trileptal- benefits did not outweigh risks Ritalin Remeron Zoloft Celexa Lexapro Trintellix- Severe n/v Wellbutrin Nortriptyline Strattera-no improvement Seroquel- RLS Rexulti- Took briefly and may have been  slightly helpful. Depakote Gabapentin Propranolol-fatigue Topamax Trazodone- excessive daytime somnolence Klonopin Xanax Ativan Ambien May have taken Lamictal  PHQ2-9   [place] Office Visit from 03/25/2016 in [place] at [facility]  PHQ-2 Total Score 0       Review of Systems:  Review of Systems  Genitourinary:       Reports frequent painful periods  Musculoskeletal: Negative for gait problem.  Neurological: Negative for tremors.  Psychiatric/Behavioral:       Please refer to HPI    Medications: I have reviewed the patient's current medications.  Current Outpatient Medications  Medication Sig Dispense Refill  . butalbital-acetaminophen-caffeine (FIORICET) 50-325-40 MG tablet Take 1-2 tablets by mouth every 6 (six) hours as needed for headache. 20 tablet 0  . calcium carbonate (TUMS - DOSED IN MG ELEMENTAL CALCIUM) 500 MG chewable tablet Chew 1 tablet by mouth as needed for indigestion or heartburn.    . clonazePAM (KLONOPIN) 1 MG tablet TAKE 1/2 TO 1 TABLET BY MOUTH TWICE DAILY AS NEEDED FOR ANXIETY 60 tablet 2  . dimenhyDRINATE (DRAMAMINE) 50 MG tablet Take 50 mg by mouth at bedtime as needed for nausea.    Marland Kitchen docusate sodium (COLACE) 100 MG capsule Take 1 capsule (100 mg total) by mouth 2 (two) times daily. (Patient taking differently: Take 100 mg by mouth daily as needed for mild constipation.) 30 capsule 2  . Multiple Vitamin (MULTIVITAMIN WITH MINERALS) TABS tablet Take 1 tablet by mouth daily. 30 tablet 1  . ranitidine (ZANTAC) 75 MG tablet Take 75 mg by mouth 2 (two) times daily.    Marland Kitchen triamcinolone (KENALOG) 0.1 %  Apply 1 application topically 2 (two) times daily. 30 g 0  . Viloxazine HCl ER (QELBREE) 200 MG CP24 Take 1 capsule (200 mg total) by mouth daily for 7 days, THEN 2 capsules (400 mg total) daily for 7 days. 21 capsule 0  . Viloxazine HCl ER (QELBREE) 200 MG CP24 Take 2 capsules (400 mg total) by mouth daily. 60 capsule 1  .  zolpidem (AMBIEN) 5 MG tablet TAKE 1 TABLET(5 MG) BY MOUTH AT BEDTIME AS NEEDED FOR SLEEP 30 tablet 2  . norgestimate-ethinyl estradiol (SPRINTEC 28) 0.25-35 MG-MCG tablet Take 1 tablet by mouth daily. (Patient not taking: Reported on 03/01/2020) 28 tablet 11  . pantoprazole (PROTONIX) 20 MG tablet Take 2 tablets (40 mg total) by mouth daily. (Patient taking differently: Take 20 mg by mouth daily. ) 60 tablet 0   No current facility-administered medications for this visit.    Medication Side Effects: None  Allergies:  Allergies  Allergen Reactions  . Prochlorperazine Other (See Comments)    lockjaw Other reaction(s): Other  . Sumatriptan Succinate Anaphylaxis and Other (See Comments)    ALL TRIPTAN MEDS  . Triptans Anaphylaxis and Other (See Comments)    Pt states throat feels as if it is closing.  Marland Kitchen Ketamine Other (See Comments)    "Pt. Reports she never wants again." major hallucinations  . Reglan [Metoclopramide] Other (See Comments)    "crawling out of my skin"    Past Medical History:  Diagnosis Date  . ADHD (attention deficit hyperactivity disorder)   . Agoraphobia   . Bipolar 1 disorder ([place])    patient denies  . Chronic pain    just migraines per patient  . Endometriosis determined by laparoscopy   . GERD (gastroesophageal reflux disease)   . High platelet count   . Migraine   . Personality disorder ([place])   . Right adrenal mass ([place])    per ct 09-06-2015  . Right ovarian cyst   . Vaginal Pap smear, abnormal     Family History  Problem Relation Age of Onset  . Migraines Mother   . Anxiety disorder Mother   . Diabetes Maternal Grandmother   . Stroke Maternal Grandfather   . Heart attack Paternal Grandfather   . Anxiety disorder Paternal Aunt   . Post-traumatic stress disorder Cousin        After [place]    Social History   Socioeconomic History  . Marital status: Single    Spouse name: Not on file  . Number of children: Not on file  . Years of  education: Not on file  . Highest education level: Not on file  Occupational History  . Not on file  Tobacco Use  . Smoking status: Current Every Day Smoker    Packs/day: 0.50    Years: 20.00    Pack years: 10.00    Types: Cigarettes  . Smokeless tobacco: Never Used  Substance and Sexual Activity  . Alcohol use: Yes    Comment: very rare  . Drug use: No  . Sexual activity: Not Currently    Birth control/protection: None  Other Topics Concern  . Not on file  Social History Narrative   Lives with parents in a one story home.  No children.     Currently not working.  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    Education: college   Social Determinants of Radio broadcast assistant Strain: Not on file  Food Insecurity: Not on  file  Transportation Needs: Not on file  Physical Activity: Not on file  Stress: Not on file  Social Connections: Not on file  Intimate Partner Violence: Not on file    Past Medical History, Surgical history, Social history, and Family history were reviewed and updated as appropriate.   Please see review of systems for further details on the patient's review from today.   Objective:   Physical Exam:  There were no vitals taken for this visit.  Physical Exam Constitutional:      General: She is not in acute distress. Musculoskeletal:        General: No deformity.  Neurological:     Mental Status: She is alert and oriented to person, place, and time.     Coordination: Coordination normal.  Psychiatric:        Attention and Perception: Attention and perception normal. She does not perceive auditory or visual hallucinations.        Mood and Affect: Mood is anxious and depressed. Affect is tearful. Affect is not labile, blunt, angry or inappropriate.        Speech: Speech normal.        Behavior: Behavior normal.        Thought Content: Thought content normal. Thought content is not paranoid or delusional. Thought content does not  include homicidal or suicidal ideation. Thought content does not include homicidal or suicidal plan.        Cognition and Memory: Cognition and memory normal.        Judgment: Judgment normal.     Comments: Insight intact     Lab Review:     Component Value Date/Time   NA 138 12/26/2019 1515   K 4.6 12/26/2019 1515   CL 98 12/26/2019 1515   CO2 30 12/26/2019 1515   GLUCOSE 75 12/26/2019 1515   BUN 12 12/26/2019 1515   CREATININE 0.87 12/26/2019 1515   CALCIUM 10.6 (H) 12/26/2019 1515   PROT 7.6 12/26/2019 1515   ALBUMIN 4.5 12/26/2019 1515   AST 13 12/26/2019 1515   ALT 8 12/26/2019 1515   ALKPHOS 60 12/26/2019 1515   BILITOT 0.3 12/26/2019 1515   GFRNONAA >60 08/05/2018 0616   GFRAA >60 08/05/2018 0616       Component Value Date/Time   WBC 6.5 08/06/2018 0533   RBC 4.05 08/06/2018 0533   HGB 13.0 08/06/2018 0533   HCT 37.6 08/06/2018 0533   PLT 268 08/06/2018 0533   MCV 92.8 08/06/2018 0533   MCH 32.1 08/06/2018 0533   MCHC 34.6 08/06/2018 0533   RDW 12.2 08/06/2018 0533   LYMPHSABS 2.7 08/06/2018 0533   MONOABS 0.5 08/06/2018 0533   EOSABS 0.2 08/06/2018 0533   BASOSABS 0.0 08/06/2018 0533    No results found for: POCLITH, LITHIUM   No results found for: PHENYTOIN, PHENOBARB, VALPROATE, CBMZ   .res Assessment: Plan:   Pt seen for 30 minutes and time spent counseling pt re: potential benefits, risks, and side effects of Qelbree. Discussed that it is currently indicated for ADD for children and adolescents. Discussed that it has been used in [place] to treat depression and may possibly be helpful for mood as well. Pt agrees to trial of Qelbree. Will start Qelbree 200 mg po qd for one week, then increase to 400 mg po qd for ADD.  Continue Klonopin prn anxiety.  Continue Ambien 5 mg po QHS prn insomnia.  Pt to follow-up in 6-8 weeks or sooner if clinically indicated.  Discussed  that it may be beneficial to resume therapy with Lina Sayre, [facility].  Patient advised  to contact office with any questions, adverse effects, or acute worsening in signs and symptoms.  Laura Simmons was seen today for depression, anxiety and add.  Diagnoses and all orders for this visit:  Attention deficit hyperactivity disorder (ADHD), combined type -     Viloxazine HCl ER (QELBREE) 200 MG CP24; Take 1 capsule (200 mg total) by mouth daily for 7 days, THEN 2 capsules (400 mg total) daily for 7 days. -     Viloxazine HCl ER (QELBREE) 200 MG CP24; Take 2 capsules (400 mg total) by mouth daily.     Please see After Visit Summary for patient specific instructions.  Future Appointments  Date Time Provider [place]  04/04/2020 10:00 AM Lina [facility], [facility] None  04/15/2020 10:30 AM Thayer Headings, [facility] CP-CP None    No orders of the defined types were placed in this encounter.   -------------------------------

## 2020-03-14 ENCOUNTER — Telehealth: Payer: Self-pay

## 2020-03-14 NOTE — Telephone Encounter (Signed)
There is and I will place a copy of appeal for you to review as well.

## 2020-03-14 NOTE — Telephone Encounter (Signed)
Prior Authorization submitted for QELBREE 200 MG with [employer] of [place], they determined a DENIAL due to medication not on the formulary and they require ALL alternative medications on patient's formulary tired and failed. Response listed Amphetamine-Dextroamphetamine, Dextroamphetamine, Methylphenidate; please refer to patient's formulary. Patient may have to contact office with that information   Will contact Janett Billow with information on how to proceed.

## 2020-03-18 ENCOUNTER — Other Ambulatory Visit: Payer: Self-pay | Admitting: Family Medicine

## 2020-03-18 ENCOUNTER — Encounter: Payer: Self-pay | Admitting: Family Medicine

## 2020-03-18 DIAGNOSIS — G8929 Other chronic pain: Secondary | ICD-10-CM

## 2020-03-18 DIAGNOSIS — R519 Headache, unspecified: Secondary | ICD-10-CM

## 2020-03-18 NOTE — Telephone Encounter (Signed)
Requesting: Fioricet 50-325-40mg  Contract: n/a UDS: n/a Last Visit: 12/26/2019 Next Visit: None Last Refill: 12/26/2019 #20 and 0RF  Please Advise

## 2020-04-04 ENCOUNTER — Other Ambulatory Visit: Payer: Self-pay

## 2020-04-04 ENCOUNTER — Ambulatory Visit (INDEPENDENT_AMBULATORY_CARE_PROVIDER_SITE_OTHER): Payer: BC Managed Care – PPO | Admitting: Psychiatry

## 2020-04-04 DIAGNOSIS — F4310 Post-traumatic stress disorder, unspecified: Secondary | ICD-10-CM | POA: Diagnosis not present

## 2020-04-04 NOTE — Progress Notes (Signed)
[facility] Counselor/Therapist Progress Note  Patient ID: Laura Simmons, MRN: [id number],    Date: 04/04/2020  Time Spent: 48 minutes start time 10:07 AM end time 10:55 AM  Treatment Type: Individual Therapy  Reported Symptoms: low motivation, depressed, health issues, fatigue, grief, flashbacks, nightmares, anxiety, focusing issues, memory issues  Mental Status Exam:  Appearance:   Casual     Behavior:  Appropriate  Motor:  Normal  Speech/Language:   Normal Rate  Affect:  Appropriate  Mood:  anxious  Thought process:  normal  Thought content:    WNL  Sensory/Perceptual disturbances:    Stomach pain  Orientation:  oriented to person, place, time/date and situation  Attention:  Fair  Concentration:  Fair  Memory:  Immediate;   [place] of knowledge:   Fair  Insight:    Fair  Judgment:   Fair  Impulse Control:  Good   Risk Assessment: Danger to Self:  No Self-injurious Behavior: No Danger to Others: No Duty to Warn:no Physical Aggression / Violence:No  Access to Firearms a concern: No  Gang Involvement:No   Subjective: Patient was present for session.  She shared that she has had lots of health issues over the past few years including shoulder surgery, blood infection, blood sugar incident, and now she is going to have to have a hysterectomy. Her best friend is working a lot so she doesn't get to see her much.  Her other friend is in [place] and working so she doesn't have a friend circle.  Her parents are still having health issues so she is terrified that she is going to wake up and hear her mom scream.  She shared she wants to be able to enjoy her time with her. She was able to get her stuff back from [place].  Patient stated she had 1 moment where she had a job for the weekend but because she did not know the computer program and somebody needed to be put in that position he knew it immediately she did not get the job long-term.  Patient shared all of  these setbacks have been difficult for her.  Patient admitted she is thought about getting another dog but cannot seem to get the past out of her mind.  She still feels very guilty about being here and not having her animals or her boyfriend who committed suicide.  Patient was encouraged to realize that hanging onto the grief probably gives her a sense of hanging onto the person but it actually keeps her stuck.  Encouraged patient to start working on forgiving herself and giving herself permission to move forward in her life.  Discussed the possibility of getting a new dog because the last time she had a huge depressive episode her Beagle who passed away was a puppy and got her up and moving and back into herself.  Looked at different options for patient.  Patient stated it still very hard but would start considering that option.  Patient also admitted that with the endometriosis getting into her bowels she is having lots of pain and it is probably time to have the hysterectomy.  She agreed to call today to get her hysterectomy scheduled.  Was encouraged to really work on her self talk and try and to remind herself that letting go with the grief is not going to make her dogs and her boyfriend leave her completely though will always be a part of her.  Developed a new treatment plan  in session could not sign due to the need for social distancing with COVID.  Interventions: Solution-Oriented/Positive Psychology  Diagnosis:   ICD-10-CM   1. PTSD (post-traumatic stress disorder)  F43.10     Plan: Patient is to use CBT and coping skills to decrease triggered responses.  Patient is to call to schedule her hysterectomy.  Patient is to start working on her self talk reminding herself that it is okay for her to forgive herself and move forward.  Patient is to work on getting outside at least for a little bit each day. Long-term goal: Develop and implement effective coping skills to carry out normal responsibilities  and participate constructively in relationships Short-term goal: Practice implement relaxation training as a coping mechanism for tension panic stress anger and anxiety  Lina Sayre, [facility]

## 2020-04-11 ENCOUNTER — Other Ambulatory Visit: Payer: Self-pay

## 2020-04-11 DIAGNOSIS — F99 Mental disorder, not otherwise specified: Secondary | ICD-10-CM

## 2020-04-11 DIAGNOSIS — F5105 Insomnia due to other mental disorder: Secondary | ICD-10-CM

## 2020-04-11 MED ORDER — ZOLPIDEM TARTRATE 5 MG PO TABS: ORAL_TABLET | ORAL | 2 refills | Status: DC

## 2020-04-15 ENCOUNTER — Encounter: Payer: Self-pay | Admitting: Psychiatry

## 2020-04-15 ENCOUNTER — Other Ambulatory Visit: Payer: Self-pay

## 2020-04-15 ENCOUNTER — Ambulatory Visit (INDEPENDENT_AMBULATORY_CARE_PROVIDER_SITE_OTHER): Payer: BC Managed Care – PPO | Admitting: Psychiatry

## 2020-04-15 VITALS — BP 161/98 | HR 83 | Wt 133.0 lb

## 2020-04-15 DIAGNOSIS — F902 Attention-deficit hyperactivity disorder, combined type: Secondary | ICD-10-CM

## 2020-04-15 DIAGNOSIS — F99 Mental disorder, not otherwise specified: Secondary | ICD-10-CM | POA: Diagnosis not present

## 2020-04-15 DIAGNOSIS — F4310 Post-traumatic stress disorder, unspecified: Secondary | ICD-10-CM

## 2020-04-15 DIAGNOSIS — F5105 Insomnia due to other mental disorder: Secondary | ICD-10-CM

## 2020-04-15 MED ORDER — MODAFINIL 200 MG PO TABS: 200.0000 mg | ORAL_TABLET | Freq: Every day | ORAL | 1 refills | Status: DC

## 2020-04-15 MED ORDER — CLONAZEPAM 1 MG PO TABS
ORAL_TABLET | ORAL | 2 refills | Status: DC
Start: 2020-04-15 — End: 2020-07-19

## 2020-04-15 NOTE — Progress Notes (Signed)
Laura Simmons [id number] [birth year] 45 y.o.  Subjective:   Patient ID:  Laura Simmons is a 45 y.o. (DOB May 21, 1975) female.  Chief Complaint:  Chief Complaint  Patient presents with  . ADHD    HPI Laura Simmons presents to the office today for follow-up of ADHD, anxiety, insomnia, and depression. She reports that her motivation remains low. She reports that her mood is low at times. She reports frequent worry and rumination about parents. She reports that her anxiety is high. She reports that she is trying not to take 2 Klonopin daily. She reports that she has difficulty being fully present in the moment. She reports that she is easily distracted and "side-tracked."  She reports poor concentration. She reports feeling easily overwhelmed and disorganized. Reports starting projects and not completing them. She reports that she is continuing to grieve the loss of her fiance and her dogs. She reports that sleep is adequate and excessive at times. Appetite "comes and goes." Energy has been low. Denies SI.   Denies any further seizure-like activity after hospitalization.   Re-started therapy with Lina Sayre, [facility]. She reports that this was helpful.   Past Psychiatric Medication Trials: Zyprexa- worsening depression Trileptal- benefits did not outweigh risks Ritalin Remeron Zoloft Celexa Lexapro Trintellix- Severe n/v Wellbutrin Nortriptyline Strattera-no improvement Seroquel- RLS Rexulti- Took briefly and may have been slightly helpful. Depakote Gabapentin Propranolol-fatigue Topamax Trazodone- excessive daytime somnolence Klonopin Xanax Ativan Ambien May have taken Lamictal Qelbree- Denied by insurance. Tolerated samples for 1-2 weeks.   [place] Office Visit from 03/25/2016 in [place] at [facility]  PHQ-2 Total Score 0       Review of Systems:  Review of Systems  HENT: Positive for dental problem.   Genitourinary:        She continues to have heavy, painful periods  Musculoskeletal: Negative for gait problem.  Allergic/Immunologic: Positive for environmental allergies.  Psychiatric/Behavioral:       Please refer to HPI    Medications: I have reviewed the patient's current medications.  Current Outpatient Medications  Medication Sig Dispense Refill  . butalbital-acetaminophen-caffeine (FIORICET) 50-325-40 MG tablet TAKE 1 TO 2 TABLETS BY MOUTH EVERY 6 HOURS AS NEEDED FOR HEADACHE 20 tablet 2  . calcium carbonate (TUMS - DOSED IN MG ELEMENTAL CALCIUM) 500 MG chewable tablet Chew 1 tablet by mouth as needed for indigestion or heartburn.    . dimenhyDRINATE (DRAMAMINE) 50 MG tablet Take 50 mg by mouth at bedtime as needed for nausea.    . diphenhydrAMINE (BENADRYL) 25 MG tablet Take 25 mg by mouth every 6 (six) hours as needed.    . docusate sodium (COLACE) 100 MG capsule Take 1 capsule (100 mg total) by mouth 2 (two) times daily. (Patient taking differently: Take 100 mg by mouth daily as needed for mild constipation.) 30 capsule 2  . modafinil (PROVIGIL) 200 MG tablet Take 1 tablet (200 mg total) by mouth daily. Take 1/2-1 tab po q am 30 tablet 1  . Multiple Vitamin (MULTIVITAMIN WITH MINERALS) TABS tablet Take 1 tablet by mouth daily. 30 tablet 1  . ranitidine (ZANTAC) 75 MG tablet Take 75 mg by mouth daily.    Marland Kitchen zolpidem (AMBIEN) 5 MG tablet TAKE 1 TABLET(5 MG) BY MOUTH AT BEDTIME AS NEEDED FOR SLEEP 30 tablet 2  . clonazePAM (KLONOPIN) 1 MG tablet TAKE 1/2 TO 1 TABLET BY MOUTH TWICE DAILY AS NEEDED FOR ANXIETY 60 tablet 2  . norgestimate-ethinyl estradiol (  SPRINTEC 28) 0.25-35 MG-MCG tablet Take 1 tablet by mouth daily. (Patient not taking: No sig reported) 28 tablet 11  . pantoprazole (PROTONIX) 20 MG tablet Take 2 tablets (40 mg total) by mouth daily. (Patient taking differently: Take 20 mg by mouth daily. ) 60 tablet 0  . triamcinolone (KENALOG) 0.1 % Apply 1 application topically 2 (two) times  daily. 30 g 0   No current facility-administered medications for this visit.    Medication Side Effects: None  Allergies:  Allergies  Allergen Reactions  . Prochlorperazine Other (See Comments)    lockjaw Other reaction(s): Other  . Sumatriptan Succinate Anaphylaxis and Other (See Comments)    ALL TRIPTAN MEDS  . Triptans Anaphylaxis and Other (See Comments)    Pt states throat feels as if it is closing.  Marland Kitchen Ketamine Other (See Comments)    "Pt. Reports she never wants again." major hallucinations  . Reglan [Metoclopramide] Other (See Comments)    "crawling out of my skin"    Past Medical History:  Diagnosis Date  . ADHD (attention deficit hyperactivity disorder)   . Agoraphobia   . Bipolar 1 disorder ([place])    patient denies  . Chronic pain    just migraines per patient  . Endometriosis determined by laparoscopy   . GERD (gastroesophageal reflux disease)   . High platelet count   . Migraine   . Personality disorder ([place])   . Right adrenal mass ([place])    per ct 09-06-2015  . Right ovarian cyst   . Vaginal Pap smear, abnormal     Family History  Problem Relation Age of Onset  . Migraines Mother   . Anxiety disorder Mother   . Diabetes Maternal Grandmother   . Stroke Maternal Grandfather   . Heart attack Paternal Grandfather   . Anxiety disorder Paternal Aunt   . Post-traumatic stress disorder Cousin        After [place]    Social History   Socioeconomic History  . Marital status: Single    Spouse name: Not on file  . Number of children: Not on file  . Years of education: Not on file  . Highest education level: Not on file  Occupational History  . Not on file  Tobacco Use  . Smoking status: Current Every Day Smoker    Packs/day: 0.50    Years: 20.00    Pack years: 10.00    Types: Cigarettes  . Smokeless tobacco: Never Used  Substance and Sexual Activity  . Alcohol use: Yes    Comment: very rare  . Drug use: No  . Sexual activity: Not Currently     Birth control/protection: None  Other Topics Concern  . Not on file  Social History Narrative   Lives with parents in a one story home.  No children.     Currently not working.  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    Education: college   Social Determinants of Radio broadcast assistant Strain: Not on file  Food Insecurity: Not on file  Transportation Needs: Not on file  Physical Activity: Not on file  Stress: Not on file  Social Connections: Not on file  Intimate Partner Violence: Not on file    Past Medical History, Surgical history, Social history, and Family history were reviewed and updated as appropriate.   Please see review of systems for further details on the patient's review from today.   Objective:   Physical Exam:  BP Marland Kitchen)  161/98   Pulse 83   Wt 133 lb (60.3 kg)   BMI 22.83 kg/m   Physical Exam Constitutional:      General: She is not in acute distress. Musculoskeletal:        General: No deformity.  Neurological:     Mental Status: She is alert and oriented to person, place, and time.     Coordination: Coordination normal.  Psychiatric:        Attention and Perception: Attention and perception normal. She does not perceive auditory or visual hallucinations.        Mood and Affect: Mood is depressed. Mood is not anxious. Affect is not labile, blunt, angry or inappropriate.        Speech: Speech normal.        Behavior: Behavior normal.        Thought Content: Thought content normal. Thought content is not paranoid or delusional. Thought content does not include homicidal or suicidal ideation. Thought content does not include homicidal or suicidal plan.        Cognition and Memory: Cognition and memory normal.        Judgment: Judgment normal.     Comments: Insight intact     Lab Review:     Component Value Date/Time   NA 138 12/26/2019 1515   K 4.6 12/26/2019 1515   CL 98 12/26/2019 1515   CO2 30 12/26/2019 1515    GLUCOSE 75 12/26/2019 1515   BUN 12 12/26/2019 1515   CREATININE 0.87 12/26/2019 1515   CALCIUM 10.6 (H) 12/26/2019 1515   PROT 7.6 12/26/2019 1515   ALBUMIN 4.5 12/26/2019 1515   AST 13 12/26/2019 1515   ALT 8 12/26/2019 1515   ALKPHOS 60 12/26/2019 1515   BILITOT 0.3 12/26/2019 1515   GFRNONAA >60 08/05/2018 0616   GFRAA >60 08/05/2018 0616       Component Value Date/Time   WBC 6.5 08/06/2018 0533   RBC 4.05 08/06/2018 0533   HGB 13.0 08/06/2018 0533   HCT 37.6 08/06/2018 0533   PLT 268 08/06/2018 0533   MCV 92.8 08/06/2018 0533   MCH 32.1 08/06/2018 0533   MCHC 34.6 08/06/2018 0533   RDW 12.2 08/06/2018 0533   LYMPHSABS 2.7 08/06/2018 0533   MONOABS 0.5 08/06/2018 0533   EOSABS 0.2 08/06/2018 0533   BASOSABS 0.0 08/06/2018 0533    No results found for: POCLITH, LITHIUM   No results found for: PHENYTOIN, PHENOBARB, VALPROATE, CBMZ   .res Assessment: Plan:   Discussed potential benefits, risks, and side effects of Modafinil. Discussed that Modafinil is used off-label for ADHD and typically has less cardiac risk compared to stimulants. Pt agrees to trial of Modafinil. Will start Modafinil 200 mg 1/2-1 tab po q am for off-label indication of ADHD.  Continue Klonopin 1 mg 1/2-1 tab po BID prn anxiety.  Continue Ambien 5 mg po QHS prn insomnia.  Recommend continuing therapy with Lina Sayre, [facility]. Pt to follow-up in 6 weeks or sooner if clinically indicated.  Patient advised to contact office with any questions, adverse effects, or acute worsening in signs and symptoms.  Kiran was seen today for adhd.  Diagnoses and all orders for this visit:  Attention deficit hyperactivity disorder (ADHD), combined type -     modafinil (PROVIGIL) 200 MG tablet; Take 1 tablet (200 mg total) by mouth daily. Take 1/2-1 tab po q am  PTSD (post-traumatic stress disorder) -     clonazePAM (KLONOPIN) 1 MG tablet; TAKE 1/2  TO 1 TABLET BY MOUTH TWICE DAILY AS NEEDED FOR  ANXIETY  Insomnia due to other mental disorder     Please see After Visit Summary for patient specific instructions.  Future Appointments  Date Time Provider [place]  05/27/2020 10:00 AM Lina Sayre, [facility] CP-CP None  05/29/2020 10:30 AM Thayer Headings, [facility]NP CP-CP None    No orders of the defined types were placed in this encounter.   -------------------------------

## 2020-05-27 ENCOUNTER — Ambulatory Visit: Payer: BC Managed Care – PPO | Admitting: Psychiatry

## 2020-05-29 ENCOUNTER — Ambulatory Visit: Payer: BC Managed Care – PPO | Admitting: Psychiatry

## 2020-06-10 ENCOUNTER — Other Ambulatory Visit: Payer: Self-pay | Admitting: Psychiatry

## 2020-06-10 DIAGNOSIS — F902 Attention-deficit hyperactivity disorder, combined type: Secondary | ICD-10-CM

## 2020-06-11 NOTE — Telephone Encounter (Signed)
Last apt 3/28 Canceled last 2 apts, nothing rescheduled yet

## 2020-06-13 NOTE — Telephone Encounter (Signed)
Called Pt. Laura Simmons on VM to schedule follow up apt. Over due.

## 2020-07-05 ENCOUNTER — Ambulatory Visit: Payer: BC Managed Care – PPO | Admitting: Psychiatry

## 2020-07-10 ENCOUNTER — Other Ambulatory Visit: Payer: Self-pay | Admitting: Psychiatry

## 2020-07-10 DIAGNOSIS — F902 Attention-deficit hyperactivity disorder, combined type: Secondary | ICD-10-CM

## 2020-07-12 NOTE — Telephone Encounter (Signed)
Scheduled 7/22

## 2020-07-16 ENCOUNTER — Other Ambulatory Visit: Payer: Self-pay

## 2020-07-16 DIAGNOSIS — F99 Mental disorder, not otherwise specified: Secondary | ICD-10-CM

## 2020-07-17 MED ORDER — ZOLPIDEM TARTRATE 5 MG PO TABS: ORAL_TABLET | ORAL | 2 refills | Status: DC

## 2020-07-18 ENCOUNTER — Ambulatory Visit: Payer: BC Managed Care – PPO | Admitting: Psychiatry

## 2020-07-19 ENCOUNTER — Other Ambulatory Visit: Payer: Self-pay

## 2020-07-19 ENCOUNTER — Ambulatory Visit: Payer: BC Managed Care – PPO | Admitting: Psychiatry

## 2020-07-19 ENCOUNTER — Telehealth: Payer: Self-pay | Admitting: Psychiatry

## 2020-07-19 DIAGNOSIS — F4310 Post-traumatic stress disorder, unspecified: Secondary | ICD-10-CM

## 2020-07-19 NOTE — Progress Notes (Signed)
[facility] Counselor/Therapist Progress Note  Patient ID: Laura Simmons, MRN: [id number],    Date: 07/19/2020  Time Spent: 60 minutes start time 12:10 PM end time 1:10 p.m.  Treatment Type: Individual Therapy  Reported Symptoms: anxiety, isolating, depression, sleep issues, flashbacks, crying spells  Mental Status Exam:  Appearance:   Well Groomed     Behavior:  Appropriate  Motor:  Normal  Speech/Language:   Normal Rate  Affect:  Congruent and Tearful  Mood:  anxious and sad  Thought process:  normal  Thought content:    WNL  Sensory/Perceptual disturbances:    WNL  Orientation:  oriented to person, place, time/date, and situation  Attention:  Good  Concentration:  Fair  Memory:  Immediate;   [place] of knowledge:   Good  Insight:    Good  Judgment:   Good  Impulse Control:  Good   Risk Assessment: Danger to Self:  No Self-injurious Behavior: No Danger to Others: No Duty to Warn:no Physical Aggression / Violence:No  Access to Firearms a concern: No  Gang Involvement:No   Subjective: Patient was present for session.  She shared that she had COVID and than it went through the family. She went on to share that she has been summoned to [employer] duty for the [employer] and the 1st time she had to go they dismissed her due to her PTSD.  She went on to report she has to deal with it for the next 4 weeks.  She went on to share she is having issues with her periods and realized it is time to get a hysterectomy.  She was encouraged to call her OBGYN and get it scheduled. She went on to share that both her parents are having health issues and she is very concerned about them and what will happen when something happens to them.  Patient was allowed time to statistic as all the stressors in her life.  She was encouraged to recognize that her health problems are probably contributing to her emotional issues especially since she is reporting having her period often and having very  difficult hard bleeding.  Patient was encouraged to recognize she has to deal with her medical issues first before she takes on any of the processing especially since she seems to be having the issues with her memory and focusing.  Patient was also encouraged to enjoy the time with her parents and to focus on doing the best she can with that situation.  Patient was able to acknowledge she wants to move forward in her life and she wants to be able to work but she recognizes she cannot seem to do with the stress and the difficulty she is having cognitively so she will address those issues and then start working on finding a job and to try to move forward with her life.  Interventions: Solution-Oriented/Positive Psychology  Diagnosis:   ICD-10-CM   1. PTSD (post-traumatic stress disorder)  F43.10       Plan: Patient is to use CBT and coping skills to decrease triggered responses.  Patient is to work on her medical issues especially getting to her OB/GYN to figure out how to deal with her endometriosis and extreme bleeding and pain that she is having.  Patient is to take medications as directed.  Patient is to work on remembering positive memories and trying to affirm herself even when things are very difficult.  Lina Sayre, [facility]

## 2020-07-19 NOTE — Telephone Encounter (Signed)
pended

## 2020-07-19 NOTE — Telephone Encounter (Signed)
Pt requesting refill for Clonazepam @ [place]. Apt 7/22

## 2020-07-23 IMAGING — MR MRI HEAD WITHOUT AND WITH CONTRAST
12 of 14 series · 40 of 48 positions shown · IV contrast (gadavist)
Comparison: Head CT 08/02/2018

CLINICAL DATA: Found unresponsive.  Possible seizure.

EXAM:
MRI HEAD WITHOUT AND WITH CONTRAST
TECHNIQUE: Multiplanar, multiecho pulse sequences of the brain and surrounding
structures were obtained without and with intravenous contrast.
CONTRAST:  5 cc Gadavist

[Series 5: DWI · axial · 3.0mm · 0.88mm/px · z∈[-56,+83]mm · 8 of 96 slices shown (1 of 4)]
[im 1/96]
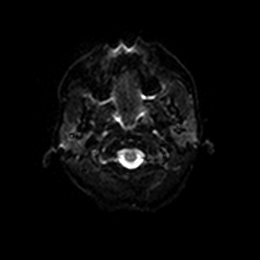
[im 14/96]
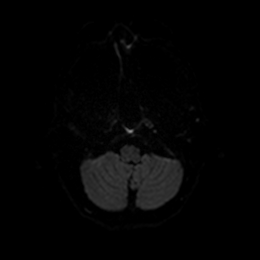
[im 28/96]
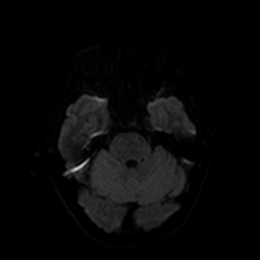
[im 41/96]
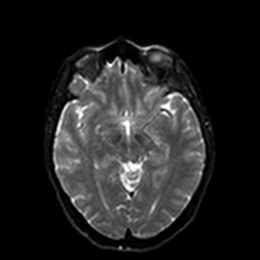
[im 55/96]
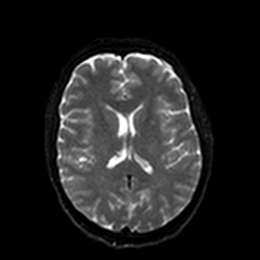
[im 68/96]
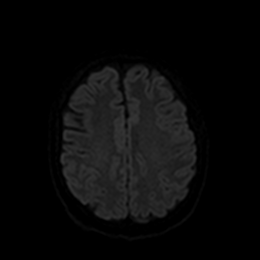
[im 82/96]
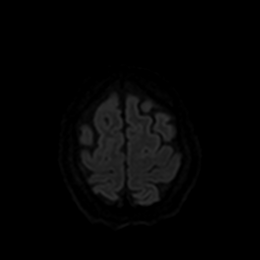
[im 96/96]
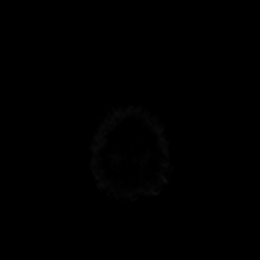

[Series 6: DWI · axial · 3.0mm · 0.88mm/px · z∈[-56,+83]mm · 4 of 48 slices shown (2 of 4)]
[im 1/48]
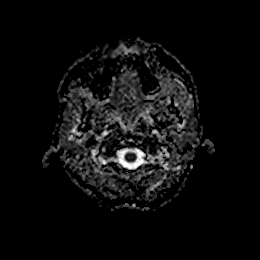
[im 16/48]
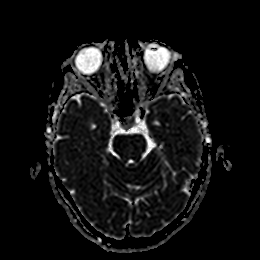
[im 32/48]
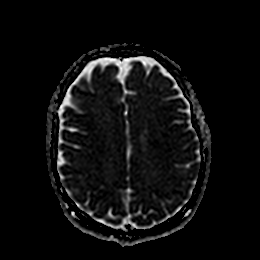
[im 48/48]
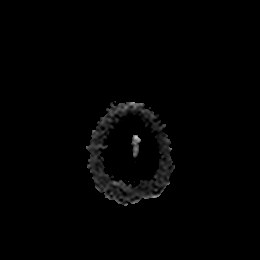

[Series 7: DWI · coronal · 4.0mm · 0.88mm/px · 5 of 64 slices shown (3 of 4)]
[im 1/64]
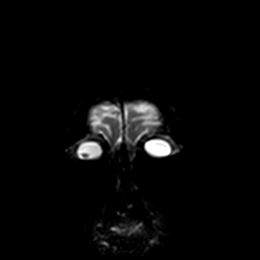
[im 16/64]
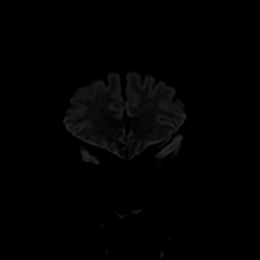
[im 32/64]
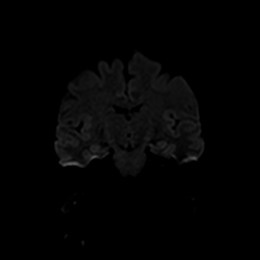
[im 48/64]
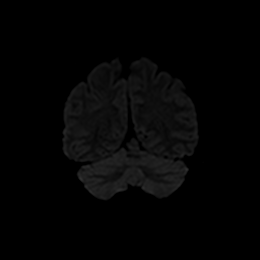
[im 64/64]
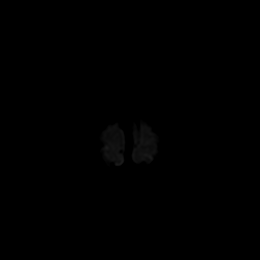

[Series 8: DWI · coronal · 4.0mm · 0.88mm/px · 3 of 32 slices shown (4 of 4)]
[im 1/32]
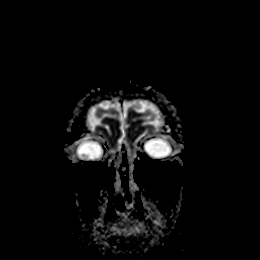
[im 16/32]
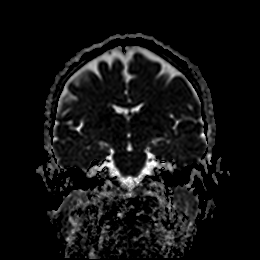
[im 32/32]
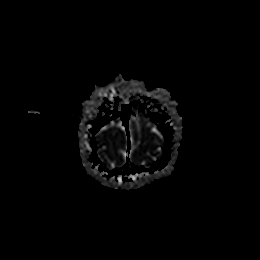

[Series 9: T1 · sagittal · 5.0mm · 0.75mm/px · 2 of 23 slices shown]
[im 1/23]
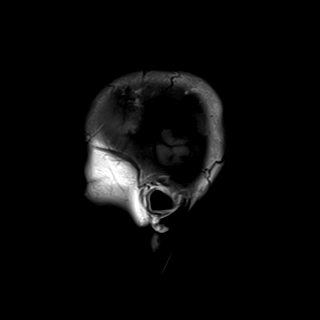
[im 23/23]
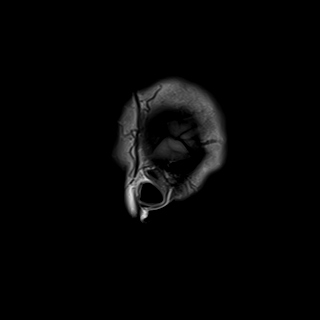

[Series 10: T2 · axial · 5.0mm · 0.72mm/px · z∈[-59,+83]mm · 2 of 25 slices shown]
[im 1/25]
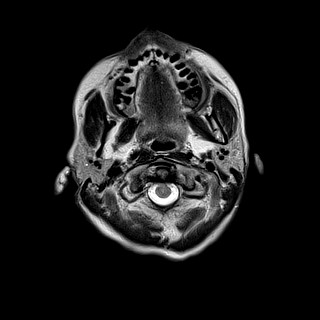
[im 25/25]
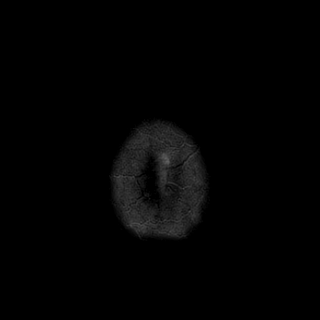

[Series 11: FLAIR · axial · 5.0mm · 0.45mm/px · z∈[-58,+85]mm · 2 of 25 slices shown (1 of 2)]
[im 1/25]
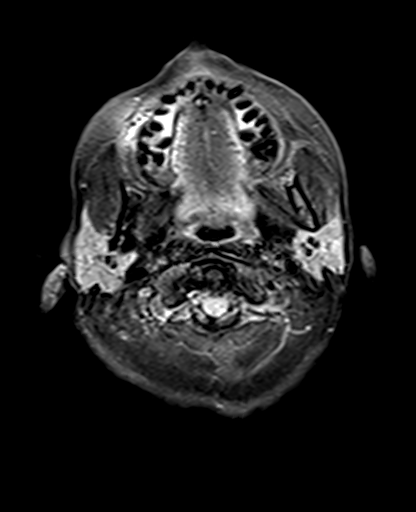
[im 25/25]
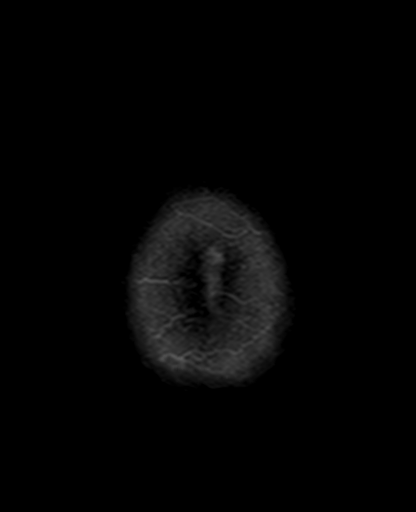

[Series 13: pha_images · axial · 3.0mm · 0.90mm/px · z∈[-62,+89]mm · 4 of 52 slices shown]
[im 1/52]
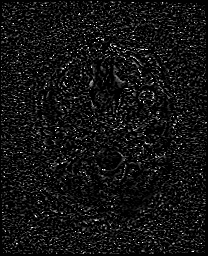
[im 18/52]
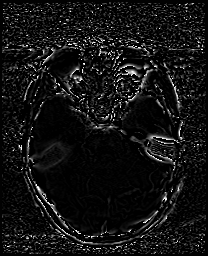
[im 35/52]
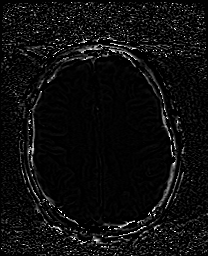
[im 52/52]
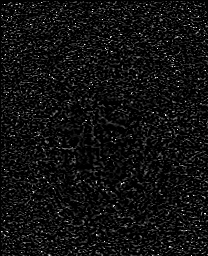

[Series 14: swi_images · axial · 3.0mm · 0.90mm/px · z∈[-62,+89]mm · 4 of 52 slices shown]
[im 1/52]
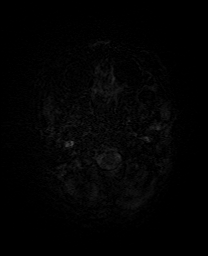
[im 18/52]
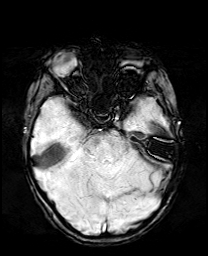
[im 35/52]
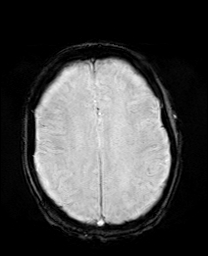
[im 52/52]
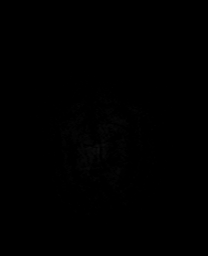

[Series 17: FLAIR · coronal · 3.0mm · 0.56mm/px · 2 of 25 slices shown (2 of 2)]
[im 1/25]
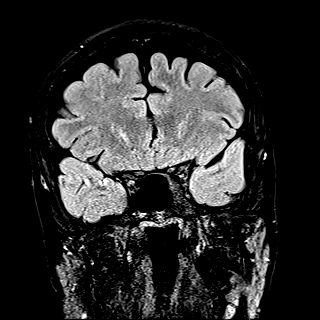
[im 25/25]
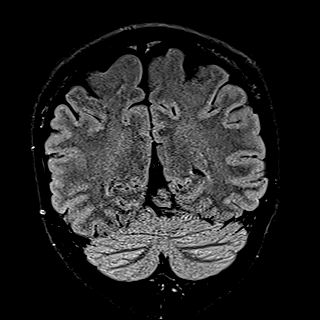

[Series 20: T1 post-contrast · coronal · 5.0mm · 0.34mm/px · 2 of 28 slices shown]
[im 1/28]
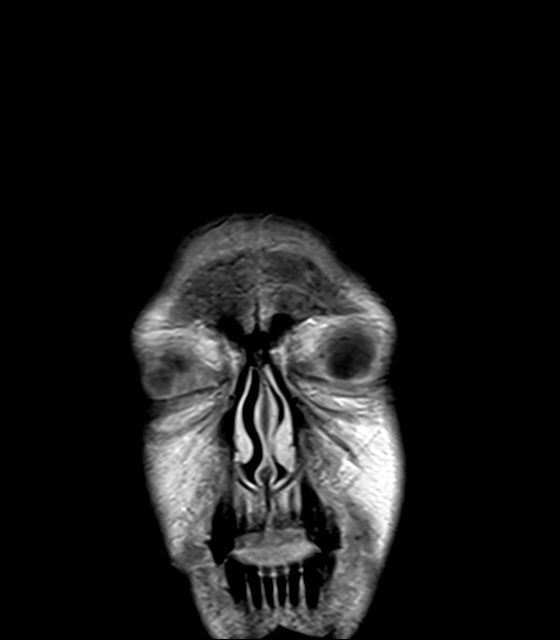
[im 28/28]
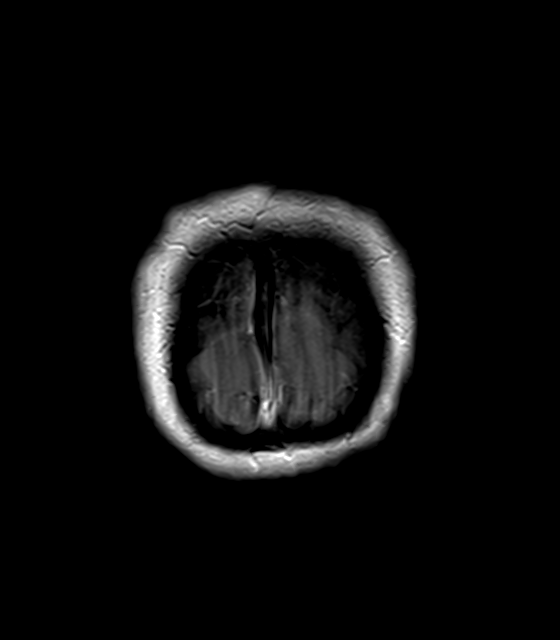

[Series 21: T2 post-contrast · coronal · 3.0mm · 0.56mm/px · 2 of 25 slices shown]
[im 1/25]
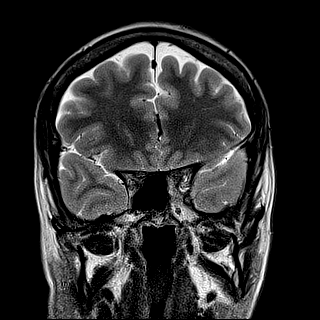
[im 25/25]
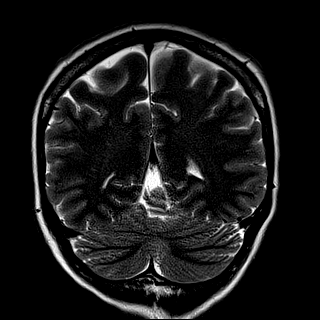

[40 of 48 positions shown; findings below may reference images not displayed]

FINDINGS: Brain: The brain has a normal appearance without evidence of
malformation, atrophy, old or acute small or large vessel
infarction, mass lesion, hemorrhage, hydrocephalus or extra-axial
collection. Mesial temporal lobes appear normal and symmetric. After
contrast administration, no abnormal enhancement occurs.

Vascular: Major vessels at the base of the brain show flow. Venous
sinuses appear patent.

Skull and upper cervical spine: Normal.

Sinuses/Orbits: Clear/normal.

Other: None significant.
IMPRESSION: Normal examination. No abnormality seen to explain the clinical
presentation.

## 2020-07-23 MED ORDER — CLONAZEPAM 1 MG PO TABS
ORAL_TABLET | ORAL | 0 refills | Status: DC
Start: 2020-07-23 — End: 2020-08-09

## 2020-08-09 ENCOUNTER — Encounter: Payer: Self-pay | Admitting: Psychiatry

## 2020-08-09 ENCOUNTER — Ambulatory Visit: Payer: BC Managed Care – PPO | Admitting: Psychiatry

## 2020-08-09 ENCOUNTER — Other Ambulatory Visit: Payer: Self-pay

## 2020-08-09 DIAGNOSIS — F99 Mental disorder, not otherwise specified: Secondary | ICD-10-CM | POA: Diagnosis not present

## 2020-08-09 DIAGNOSIS — F902 Attention-deficit hyperactivity disorder, combined type: Secondary | ICD-10-CM | POA: Diagnosis not present

## 2020-08-09 DIAGNOSIS — F5105 Insomnia due to other mental disorder: Secondary | ICD-10-CM | POA: Diagnosis not present

## 2020-08-09 DIAGNOSIS — F4310 Post-traumatic stress disorder, unspecified: Secondary | ICD-10-CM | POA: Diagnosis not present

## 2020-08-09 MED ORDER — MODAFINIL 200 MG PO TABS: 200.0000 mg | ORAL_TABLET | Freq: Every day | ORAL | 3 refills | Status: DC

## 2020-08-09 MED ORDER — ZOLPIDEM TARTRATE 5 MG PO TABS
ORAL_TABLET | ORAL | 2 refills | Status: DC
Start: 2020-10-09 — End: 2020-11-08

## 2020-08-09 MED ORDER — CLONAZEPAM 1 MG PO TABS: ORAL_TABLET | ORAL | 2 refills | Status: DC

## 2020-08-09 NOTE — Progress Notes (Signed)
Tovah Loughridge [id number] 07-04-1975 45 y.o.  Subjective:   Patient ID:  Laura Simmons is a 45 y.o. (DOB 1975-04-19) female.  Chief Complaint:  Chief Complaint  Patient presents with   Follow-up    Anxiety, depression, and ADHD    HPI Laura Simmons presents to the office today for follow-up of depression, anxiety, and ADHD. She reports that she is feeling "better" and that Modafinil has been more effective than other meds. She reports anxiety has been ok. She reports some improvement in depression. She reports that her energy is fair. Motivation has improved. Concentration has been improved. Sleeping well. Appetite has been ok. Denies SI.   She is considering making an appointment for a hysterectomy.   She reports that she was called for jury duty and had to be on call on 4 weeks. She reports that she has been staying home due to gas prices.   Past Psychiatric Medication Trials: Zyprexa- worsening depression Trileptal- benefits did not outweigh risks Ritalin Remeron Zoloft Celexa Lexapro Trintellix- Severe n/v Wellbutrin Nortriptyline Strattera-no improvement Seroquel- RLS Rexulti- Took briefly and may have been slightly helpful. Depakote Gabapentin Propranolol-fatigue Topamax Trazodone- excessive daytime somnolence Klonopin Xanax Ativan Ambien May have taken Lamictal Qelbree- Denied by insurance. Tolerated samples for 1-2 weeks.   [place] Office Visit from 03/25/2016 in [place] at [facility]  PHQ-2 Total Score 0        Review of Systems:  Review of Systems  Cardiovascular:  Negative for palpitations.  Genitourinary:  Positive for menstrual problem.  Musculoskeletal:  Negative for gait problem.  Neurological:  Positive for headaches.  Psychiatric/Behavioral:         Please refer to HPI   Medications: I have reviewed the patient's current medications.  Current Outpatient Medications  Medication Sig  Dispense Refill   butalbital-acetaminophen-caffeine (FIORICET) 50-325-40 MG tablet TAKE 1 TO 2 TABLETS BY MOUTH EVERY 6 HOURS AS NEEDED FOR HEADACHE 20 tablet 2   calcium carbonate (TUMS - DOSED IN MG ELEMENTAL CALCIUM) 500 MG chewable tablet Chew 1 tablet by mouth as needed for indigestion or heartburn.     dimenhyDRINATE (DRAMAMINE) 50 MG tablet Take 50 mg by mouth at bedtime as needed for nausea.     diphenhydrAMINE (BENADRYL) 25 MG tablet Take 25 mg by mouth every 6 (six) hours as needed.     docusate sodium (COLACE) 100 MG capsule Take 1 capsule (100 mg total) by mouth 2 (two) times daily. (Patient taking differently: Take 100 mg by mouth daily as needed for mild constipation.) 30 capsule 2   Multiple Vitamin (MULTIVITAMIN WITH MINERALS) TABS tablet Take 1 tablet by mouth daily. 30 tablet 1   [START ON 08/20/2020] clonazePAM (KLONOPIN) 1 MG tablet TAKE 1/2 TO 1 TABLET BY MOUTH TWICE DAILY AS NEEDED FOR ANXIETY 60 tablet 2   modafinil (PROVIGIL) 200 MG tablet Take 1 tablet (200 mg total) by mouth daily. 30 tablet 3   norgestimate-ethinyl estradiol (SPRINTEC 28) 0.25-35 MG-MCG tablet Take 1 tablet by mouth daily. (Patient not taking: No sig reported) 28 tablet 11   pantoprazole (PROTONIX) 20 MG tablet Take 2 tablets (40 mg total) by mouth daily. (Patient taking differently: Take 20 mg by mouth daily as needed.) 60 tablet 0   triamcinolone (KENALOG) 0.1 % Apply 1 application topically 2 (two) times daily. (Patient not taking: Reported on 08/09/2020) 30 g 0   [START ON 10/09/2020] zolpidem (AMBIEN) 5 MG tablet TAKE 1 TABLET(5 MG)  BY MOUTH AT BEDTIME AS NEEDED FOR SLEEP 30 tablet 2   No current facility-administered medications for this visit.    Medication Side Effects: None  Allergies:  Allergies  Allergen Reactions   Prochlorperazine Other (See Comments)    lockjaw Other reaction(s): Other   Sumatriptan Succinate Anaphylaxis and Other (See Comments)    ALL TRIPTAN MEDS   Triptans  Anaphylaxis and Other (See Comments)    Pt states throat feels as if it is closing.   Ketamine Other (See Comments)    "Pt. Reports she never wants again." major hallucinations   Reglan [Metoclopramide] Other (See Comments)    "crawling out of my skin"    Past Medical History:  Diagnosis Date   ADHD (attention deficit hyperactivity disorder)    Agoraphobia    Bipolar 1 disorder ([place])    patient denies   Chronic pain    just migraines per patient   Endometriosis determined by laparoscopy    GERD (gastroesophageal reflux disease)    High platelet count    Migraine    Personality disorder ([place])    Right adrenal mass ([place])    per ct 09-06-2015   Right ovarian cyst    Vaginal Pap smear, abnormal     Past Medical History, Surgical history, Social history, and Family history were reviewed and updated as appropriate.   Please see review of systems for further details on the patient's review from today.   Objective:   Physical Exam:  BP (!) 144/75   Pulse 75   Physical Exam Constitutional:      General: She is not in acute distress. Musculoskeletal:        General: No deformity.  Neurological:     Mental Status: She is alert and oriented to person, place, and time.     Coordination: Coordination normal.  Psychiatric:        Attention and Perception: Attention and perception normal. She does not perceive auditory or visual hallucinations.        Mood and Affect: Mood is not depressed. Affect is not labile, blunt, angry or inappropriate.        Speech: Speech normal.        Behavior: Behavior normal.        Thought Content: Thought content normal. Thought content is not paranoid or delusional. Thought content does not include homicidal or suicidal ideation. Thought content does not include homicidal or suicidal plan.        Cognition and Memory: Cognition and memory normal.        Judgment: Judgment normal.     Comments: Insight intact Mood presents as less anxious     Lab Review:     Component Value Date/Time   NA 138 12/26/2019 1515   K 4.6 12/26/2019 1515   CL 98 12/26/2019 1515   CO2 30 12/26/2019 1515   GLUCOSE 75 12/26/2019 1515   BUN 12 12/26/2019 1515   CREATININE 0.87 12/26/2019 1515   CALCIUM 10.6 (H) 12/26/2019 1515   PROT 7.6 12/26/2019 1515   ALBUMIN 4.5 12/26/2019 1515   AST 13 12/26/2019 1515   ALT 8 12/26/2019 1515   ALKPHOS 60 12/26/2019 1515   BILITOT 0.3 12/26/2019 1515   GFRNONAA >60 08/05/2018 0616   GFRAA >60 08/05/2018 0616       Component Value Date/Time   WBC 6.5 08/06/2018 0533   RBC 4.05 08/06/2018 0533   HGB 13.0 08/06/2018 0533   HCT 37.6 08/06/2018 0533  PLT 268 08/06/2018 0533   MCV 92.8 08/06/2018 0533   MCH 32.1 08/06/2018 0533   MCHC 34.6 08/06/2018 0533   RDW 12.2 08/06/2018 0533   LYMPHSABS 2.7 08/06/2018 0533   MONOABS 0.5 08/06/2018 0533   EOSABS 0.2 08/06/2018 0533   BASOSABS 0.0 08/06/2018 0533    No results found for: POCLITH, LITHIUM   No results found for: PHENYTOIN, PHENOBARB, VALPROATE, CBMZ   .res Assessment: Plan:   Will continue current plan of care since target signs and symptoms are well controlled without any tolerability issues. Continue Modafinil 200 mg po qd for off-label indication of ADHD. Continue Klonopin 1 mg 1/2-1 tab po BID prn anxiety.  Continue Ambien 5 mg po QHS prn insomnia.  Recommend continuing therapy with Lina Sayre, [facility].  Patient advised to contact office with any questions, adverse effects, or acute worsening in signs and symptoms.   Laura Simmons was seen today for follow-up.  Diagnoses and all orders for this visit:  PTSD (post-traumatic stress disorder) -     clonazePAM (KLONOPIN) 1 MG tablet; TAKE 1/2 TO 1 TABLET BY MOUTH TWICE DAILY AS NEEDED FOR ANXIETY  Attention deficit hyperactivity disorder (ADHD), combined type -     Discontinue: modafinil (PROVIGIL) 200 MG tablet; Take 1 tablet (200 mg total) by mouth daily. -     modafinil (PROVIGIL)  200 MG tablet; Take 1 tablet (200 mg total) by mouth daily.  Insomnia due to other mental disorder -     zolpidem (AMBIEN) 5 MG tablet; TAKE 1 TABLET(5 MG) BY MOUTH AT BEDTIME AS NEEDED FOR SLEEP    Please see After Visit Summary for patient specific instructions.  Future Appointments  Date Time Provider [place]  08/29/2020  2:00 PM Lina Sayre, [facility] CP-CP None    No orders of the defined types were placed in this encounter.   -------------------------------

## 2020-08-11 ENCOUNTER — Other Ambulatory Visit: Payer: Self-pay | Admitting: Family Medicine

## 2020-08-11 DIAGNOSIS — G8929 Other chronic pain: Secondary | ICD-10-CM

## 2020-08-29 ENCOUNTER — Ambulatory Visit: Payer: BC Managed Care – PPO | Admitting: Psychiatry

## 2020-11-08 ENCOUNTER — Ambulatory Visit: Payer: BC Managed Care – PPO | Admitting: Psychiatry

## 2020-11-08 ENCOUNTER — Other Ambulatory Visit: Payer: Self-pay

## 2020-11-08 VITALS — BP 161/76 | HR 87 | Wt 133.0 lb

## 2020-11-08 DIAGNOSIS — F902 Attention-deficit hyperactivity disorder, combined type: Secondary | ICD-10-CM

## 2020-11-08 DIAGNOSIS — F331 Major depressive disorder, recurrent, moderate: Secondary | ICD-10-CM | POA: Diagnosis not present

## 2020-11-08 DIAGNOSIS — F99 Mental disorder, not otherwise specified: Secondary | ICD-10-CM

## 2020-11-08 DIAGNOSIS — F5105 Insomnia due to other mental disorder: Secondary | ICD-10-CM | POA: Diagnosis not present

## 2020-11-08 DIAGNOSIS — F4310 Post-traumatic stress disorder, unspecified: Secondary | ICD-10-CM | POA: Diagnosis not present

## 2020-11-08 MED ORDER — ZOLPIDEM TARTRATE 5 MG PO TABS: ORAL_TABLET | ORAL | 2 refills | Status: DC

## 2020-11-08 MED ORDER — BUPROPION HCL ER (XL) 150 MG PO TB24: 150.0000 mg | ORAL_TABLET | Freq: Every day | ORAL | 2 refills | Status: DC

## 2020-11-08 MED ORDER — CLONAZEPAM 1 MG PO TABS: ORAL_TABLET | ORAL | 2 refills | Status: DC

## 2020-11-08 MED ORDER — MODAFINIL 200 MG PO TABS: 200.0000 mg | ORAL_TABLET | Freq: Every day | ORAL | 2 refills | Status: DC

## 2020-11-08 NOTE — Progress Notes (Signed)
Laura Simmons [id number] 1975-08-03 45 y.o.  Subjective:   Patient ID:  Laura Simmons is a 45 y.o. (DOB [birth year]) female.  Chief Complaint:  Chief Complaint  Patient presents with   Depression   Anxiety     HPI Laura Simmons presents to the office today for follow-up of anxiety, depression, and insomnia. "I'm not moving forward." Father recently learned that he had a CVA in 2018 that he was not aware. Father has frequent medical appointments and mother has also had dizziness.   "I don't have any meaning in my life, I can't find my meaning." She reports that she does not have the motivation to meet new people. Has also been withdrawn from friends at times. Reports that she is staying at home more. Has had less anxiety with not leaving her room. Reports that she has had some depression. Not interested in things. Anhedonia other than when she can be around best friends a couple times a month. Energy has also been low. Concentration has improved "but not the way I would want it to be." Appetite has been good. Reports that Ambien remains effective and she is not taking it every night. She reports difficulty with her memory. Denies SI.   Past Psychiatric Medication Trials: Zyprexa- worsening depression Trileptal- benefits did not outweigh risks Ritalin Remeron Zoloft Celexa Lexapro Trintellix- Severe n/v Wellbutrin Nortriptyline Strattera-no improvement Seroquel- RLS Rexulti- Took briefly and may have been slightly helpful. Depakote Gabapentin Propranolol-fatigue Topamax Trazodone- excessive daytime somnolence Klonopin Xanax Ativan Ambien May have taken Lamictal Qelbree- Denied by insurance. Tolerated samples for 1-2 weeks.   [place] Office Visit from 03/25/2016 in [place] at [facility]  PHQ-2 Total Score 0        Review of Systems:  Review of Systems  Endocrine:       Denies any recent episodes of hypoglycemia   Genitourinary:  Positive for menstrual problem.  Musculoskeletal:  Negative for gait problem.  Psychiatric/Behavioral:         Please refer to HPI   Medications: I have reviewed the patient's current medications.  Current Outpatient Medications  Medication Sig Dispense Refill   buPROPion (WELLBUTRIN XL) 150 MG 24 hr tablet Take 1 tablet (150 mg total) by mouth daily. 30 tablet 2   calcium carbonate (TUMS - DOSED IN MG ELEMENTAL CALCIUM) 500 MG chewable tablet Chew 1 tablet by mouth as needed for indigestion or heartburn.     dimenhyDRINATE (DRAMAMINE) 50 MG tablet Take 50 mg by mouth at bedtime as needed for nausea.     diphenhydrAMINE (BENADRYL) 25 MG tablet Take 25 mg by mouth every 6 (six) hours as needed.     docusate sodium (COLACE) 100 MG capsule Take 1 capsule (100 mg total) by mouth 2 (two) times daily. (Patient taking differently: Take 100 mg by mouth daily as needed for mild constipation.) 30 capsule 2   Multiple Vitamin (MULTIVITAMIN WITH MINERALS) TABS tablet Take 1 tablet by mouth daily. 30 tablet 1   butalbital-acetaminophen-caffeine (FIORICET) 50-325-40 MG tablet TAKE 1 TO 2 TABLETS BY MOUTH EVERY 6 HOURS AS NEEDED FOR HEADACHE (Patient not taking: Reported on 11/08/2020) 20 tablet 0   [START ON 12/02/2020] clonazePAM (KLONOPIN) 1 MG tablet TAKE 1/2 TO 1 TABLET BY MOUTH TWICE DAILY AS NEEDED FOR ANXIETY 60 tablet 2   [START ON 12/03/2020] modafinil (PROVIGIL) 200 MG tablet Take 1 tablet (200 mg total) by mouth daily. 30 tablet 2   norgestimate-ethinyl  estradiol (SPRINTEC 28) 0.25-35 MG-MCG tablet Take 1 tablet by mouth daily. (Patient not taking: No sig reported) 28 tablet 11   pantoprazole (PROTONIX) 20 MG tablet Take 2 tablets (40 mg total) by mouth daily. (Patient taking differently: Take 20 mg by mouth daily as needed.) 60 tablet 0   triamcinolone (KENALOG) 0.1 % Apply 1 application topically 2 (two) times daily. (Patient not taking: Reported on 08/09/2020) 30 g 0   zolpidem  (AMBIEN) 5 MG tablet TAKE 1 TABLET(5 MG) BY MOUTH AT BEDTIME AS NEEDED FOR SLEEP 30 tablet 2   No current facility-administered medications for this visit.    Medication Side Effects: None  Allergies:  Allergies  Allergen Reactions   Prochlorperazine Other (See Comments)    lockjaw Other reaction(s): Other   Sumatriptan Succinate Anaphylaxis and Other (See Comments)    ALL TRIPTAN MEDS   Triptans Anaphylaxis and Other (See Comments)    Pt states throat feels as if it is closing.   Ketamine Other (See Comments)    "Pt. Reports she never wants again." major hallucinations   Reglan [Metoclopramide] Other (See Comments)    "crawling out of my skin"    Past Medical History:  Diagnosis Date   ADHD (attention deficit hyperactivity disorder)    Agoraphobia    Bipolar 1 disorder ([place])    patient denies   Chronic pain    just migraines per patient   Endometriosis determined by laparoscopy    GERD (gastroesophageal reflux disease)    High platelet count    Migraine    Personality disorder ([place])    Right adrenal mass ([place])    per ct 09-06-2015   Right ovarian cyst    Vaginal Pap smear, abnormal     Past Medical History, Surgical history, Social history, and Family history were reviewed and updated as appropriate.   Please see review of systems for further details on the patient's review from today.   Objective:   Physical Exam:  BP (!) 161/76   Pulse 87   Wt 133 lb (60.3 kg)   BMI 22.83 kg/m   Physical Exam Constitutional:      General: She is not in acute distress. Musculoskeletal:        General: No deformity.  Neurological:     Mental Status: She is alert and oriented to person, place, and time.     Coordination: Coordination normal.  Psychiatric:        Attention and Perception: Attention and perception normal. She does not perceive auditory or visual hallucinations.        Mood and Affect: Mood is anxious and depressed. Affect is tearful. Affect is not  labile, blunt, angry or inappropriate.        Speech: Speech normal.        Behavior: Behavior normal. Behavior is cooperative.        Thought Content: Thought content normal. Thought content is not paranoid or delusional. Thought content does not include homicidal or suicidal ideation. Thought content does not include homicidal or suicidal plan.        Cognition and Memory: Cognition and memory normal.        Judgment: Judgment normal.     Comments: Insight intact    Lab Review:     Component Value Date/Time   NA 138 12/26/2019 1515   K 4.6 12/26/2019 1515   CL 98 12/26/2019 1515   CO2 30 12/26/2019 1515   GLUCOSE 75 12/26/2019 1515   BUN  12 12/26/2019 1515   CREATININE 0.87 12/26/2019 1515   CALCIUM 10.6 (H) 12/26/2019 1515   PROT 7.6 12/26/2019 1515   ALBUMIN 4.5 12/26/2019 1515   AST 13 12/26/2019 1515   ALT 8 12/26/2019 1515   ALKPHOS 60 12/26/2019 1515   BILITOT 0.3 12/26/2019 1515   GFRNONAA >60 08/05/2018 0616   GFRAA >60 08/05/2018 0616       Component Value Date/Time   WBC 6.5 08/06/2018 0533   RBC 4.05 08/06/2018 0533   HGB 13.0 08/06/2018 0533   HCT 37.6 08/06/2018 0533   PLT 268 08/06/2018 0533   MCV 92.8 08/06/2018 0533   MCH 32.1 08/06/2018 0533   MCHC 34.6 08/06/2018 0533   RDW 12.2 08/06/2018 0533   LYMPHSABS 2.7 08/06/2018 0533   MONOABS 0.5 08/06/2018 0533   EOSABS 0.2 08/06/2018 0533   BASOSABS 0.0 08/06/2018 0533    No results found for: POCLITH, LITHIUM   No results found for: PHENYTOIN, PHENOBARB, VALPROATE, CBMZ   .res Assessment: Plan:   Discussed several possible treatment options for depression to include Lamictal and Wellbutrin. Counseled patient regarding potential benefits, risks, and side effects of Lamictal to include potential risk of Stevens-Johnson syndrome. She reports that she would prefer to start trial of Wellbutrin XL.  Will start Wellbutrin XL 150 mg po qd for depression. Continue Klonopin 1 mg 1/2-1 tab po BID prn  anxiety.  Continue Modafinil 200 mg po q am for off-label indication of ADHD.  Continue Ambien 5 mg po QHS as needed for insomnia. Pt to follow-up in 2 months or sooner if clinically indicated.  Recommend continuing therapy with Lina Sayre, [facility].  Patient advised to contact office with any questions, adverse effects, or acute worsening in signs and symptoms.   Yaneisy was seen today for depression and anxiety.  Diagnoses and all orders for this visit:  Major depressive disorder, recurrent episode, moderate (HCC) -     buPROPion (WELLBUTRIN XL) 150 MG 24 hr tablet; Take 1 tablet (150 mg total) by mouth daily.  PTSD (post-traumatic stress disorder) -     clonazePAM (KLONOPIN) 1 MG tablet; TAKE 1/2 TO 1 TABLET BY MOUTH TWICE DAILY AS NEEDED FOR ANXIETY  Attention deficit hyperactivity disorder (ADHD), combined type -     modafinil (PROVIGIL) 200 MG tablet; Take 1 tablet (200 mg total) by mouth daily.  Insomnia due to other mental disorder -     zolpidem (AMBIEN) 5 MG tablet; TAKE 1 TABLET(5 MG) BY MOUTH AT BEDTIME AS NEEDED FOR SLEEP    Please see After Visit Summary for patient specific instructions.  Future Appointments  Date Time Provider [place]  02/06/2021  1:00 PM Thayer Headings, PMHNP CP-CP None     No orders of the defined types were placed in this encounter.   -------------------------------

## 2020-11-09 ENCOUNTER — Encounter: Payer: Self-pay | Admitting: Psychiatry

## 2020-11-15 DIAGNOSIS — C44612 Basal cell carcinoma of skin of right upper limb, including shoulder: Secondary | ICD-10-CM | POA: Diagnosis not present

## 2020-12-05 ENCOUNTER — Other Ambulatory Visit: Payer: Self-pay | Admitting: Psychiatry

## 2020-12-05 DIAGNOSIS — F902 Attention-deficit hyperactivity disorder, combined type: Secondary | ICD-10-CM

## 2020-12-06 ENCOUNTER — Other Ambulatory Visit: Payer: Self-pay

## 2020-12-06 DIAGNOSIS — F902 Attention-deficit hyperactivity disorder, combined type: Secondary | ICD-10-CM

## 2020-12-06 MED ORDER — MODAFINIL 200 MG PO TABS: 200.0000 mg | ORAL_TABLET | Freq: Every day | ORAL | 0 refills | Status: DC

## 2020-12-06 NOTE — Telephone Encounter (Signed)
Rx was sent to [facility]. Patient wants this Rx sent to [facility] due to cost.  Rx kept printing despite setting it to normal.  New encounter created and manually entered Rx and resent to [place]. Cancelled Rx with [facility].

## 2020-12-06 NOTE — Addendum Note (Signed)
Addended by: Bonney Leitz T on: 12/06/2020 12:22 PM   Modules accepted: Orders

## 2020-12-06 NOTE — Telephone Encounter (Signed)
LVM for patient to [facility] to verify pharmacy

## 2020-12-06 NOTE — Telephone Encounter (Signed)
Can you clarify with pt if using [facility] or [facility]?

## 2020-12-17 DIAGNOSIS — C44519 Basal cell carcinoma of skin of other part of trunk: Secondary | ICD-10-CM | POA: Diagnosis not present

## 2021-01-04 ENCOUNTER — Other Ambulatory Visit: Payer: Self-pay | Admitting: Psychiatry

## 2021-01-04 DIAGNOSIS — F902 Attention-deficit hyperactivity disorder, combined type: Secondary | ICD-10-CM

## 2021-02-06 ENCOUNTER — Ambulatory Visit: Payer: BC Managed Care – PPO | Admitting: Psychiatry

## 2021-02-06 ENCOUNTER — Encounter: Payer: Self-pay | Admitting: Psychiatry

## 2021-02-06 ENCOUNTER — Other Ambulatory Visit: Payer: Self-pay

## 2021-02-06 DIAGNOSIS — F4310 Post-traumatic stress disorder, unspecified: Secondary | ICD-10-CM

## 2021-02-06 DIAGNOSIS — F331 Major depressive disorder, recurrent, moderate: Secondary | ICD-10-CM

## 2021-02-06 DIAGNOSIS — F99 Mental disorder, not otherwise specified: Secondary | ICD-10-CM

## 2021-02-06 DIAGNOSIS — F902 Attention-deficit hyperactivity disorder, combined type: Secondary | ICD-10-CM

## 2021-02-06 DIAGNOSIS — F5105 Insomnia due to other mental disorder: Secondary | ICD-10-CM | POA: Diagnosis not present

## 2021-02-06 MED ORDER — ZOLPIDEM TARTRATE 5 MG PO TABS: ORAL_TABLET | ORAL | 2 refills | Status: DC

## 2021-02-06 MED ORDER — MODAFINIL 200 MG PO TABS: 200.0000 mg | ORAL_TABLET | Freq: Every day | ORAL | 3 refills | Status: DC

## 2021-02-06 MED ORDER — CLONAZEPAM 1 MG PO TABS: ORAL_TABLET | ORAL | 2 refills | Status: DC

## 2021-02-06 MED ORDER — BUPROPION HCL ER (XL) 150 MG PO TB24: 150.0000 mg | ORAL_TABLET | Freq: Every day | ORAL | 2 refills | Status: DC

## 2021-02-06 NOTE — Progress Notes (Signed)
Laura Simmons [id number] 06-Oct-1975 46 y.o.  Subjective:   Patient ID:  Laura Simmons is a 46 y.o. (DOB Sep 07, 1975) female.  Chief Complaint:  Chief Complaint  Patient presents with   Follow-up    Anxiety, depression, insomnia, ADHD    HPI [name] presents to the office today for follow-up of depression, anxiety, and insomnia. She feels that Wellbutrin has been "working a little bit." Helped close friend move to [place] after Christmas and they drove out there. She reports "now I don't have any friends left in [place]." She reports that she saw a dermatologist and was diagnosed with skin cancer on her shoulder. She plans to see an obgyn for heavy, painful menstrual periods after dealing with skin cancer. Motivation remains low. She reports continued periods of depression. She reports feeling overwhelmed at times. Isolating at times. Had some panic while helping friend get to [place]. She reports that she usually has a few Klonopin remaining at the end of the month. Denies any worsening in anxiety and irritability. Sleep has been ok. Appetite has been good. Denies any change in concentration with Wellbutrin. Denies SI.   "The medicine I am on now is working the best it can."   Parents are having medical issues.   Past Psychiatric Medication Trials: Zyprexa- worsening depression Trileptal- benefits did not outweigh risks Ritalin Remeron Zoloft Celexa Lexapro Trintellix- Severe n/v Wellbutrin Nortriptyline Strattera-no improvement Seroquel- RLS Rexulti- Took briefly and may have been slightly helpful. Depakote Gabapentin Propranolol-fatigue Topamax Trazodone- excessive daytime somnolence Klonopin Xanax Ativan Ambien May have taken Lamictal [place]- Denied by insurance. Tolerated samples for 1-2 weeks.   [place] Office Visit from 03/25/2016 in [place] at [facility]  PHQ-2 Total Score 0        Review of  Systems:  Review of Systems  Genitourinary:        Heavy, painful menstrual periods  Musculoskeletal:  Negative for gait problem.  Skin:        Recently dx'd with skin cancer. Has consultation tomorrow.  Neurological:  Positive for headaches. Negative for tremors.  Psychiatric/Behavioral:         Please refer to HPI   Medications: I have reviewed the patient's current medications.  Current Outpatient Medications  Medication Sig Dispense Refill   buPROPion (WELLBUTRIN XL) 150 MG 24 hr tablet Take 1 tablet (150 mg total) by mouth Simmons. 30 tablet 2   butalbital-acetaminophen-caffeine (FIORICET) 50-325-40 MG tablet TAKE 1 TO 2 TABLETS BY MOUTH EVERY 6 HOURS AS NEEDED FOR HEADACHE (Patient not taking: Reported on 11/08/2020) 20 tablet 0   calcium carbonate (TUMS - DOSED IN MG ELEMENTAL CALCIUM) 500 MG chewable tablet Chew 1 tablet by mouth as needed for indigestion or heartburn.     [START ON 03/11/2021] clonazePAM (KLONOPIN) 1 MG tablet TAKE 1/2 TO 1 TABLET BY MOUTH TWICE Simmons AS NEEDED FOR ANXIETY 60 tablet 2   dimenhyDRINATE (DRAMAMINE) 50 MG tablet Take 50 mg by mouth at bedtime as needed for nausea.     diphenhydrAMINE (BENADRYL) 25 MG tablet Take 25 mg by mouth every 6 (six) hours as needed.     docusate sodium (COLACE) 100 MG capsule Take 1 capsule (100 mg total) by mouth 2 (two) times Simmons. (Patient taking differently: Take 100 mg by mouth Simmons as needed for mild constipation.) 30 capsule 2   modafinil (PROVIGIL) 200 MG tablet Take 1 tablet (200 mg total) by mouth Simmons. 30 tablet 3  Multiple Vitamin (MULTIVITAMIN WITH MINERALS) TABS tablet Take 1 tablet by mouth Simmons. 30 tablet 1   norgestimate-ethinyl estradiol (SPRINTEC 28) 0.25-35 MG-MCG tablet Take 1 tablet by mouth Simmons. (Patient not taking: No sig reported) 28 tablet 11   pantoprazole (PROTONIX) 20 MG tablet Take 2 tablets (40 mg total) by mouth Simmons. (Patient taking differently: Take 20 mg by mouth Simmons as needed.) 60  tablet 0   triamcinolone (KENALOG) 0.1 % Apply 1 application topically 2 (two) times Simmons. (Patient not taking: Reported on 08/09/2020) 30 g 0   [START ON 04/08/2021] zolpidem (AMBIEN) 5 MG tablet TAKE 1 TABLET(5 MG) BY MOUTH AT BEDTIME AS NEEDED FOR SLEEP 30 tablet 2   No current facility-administered medications for this visit.    Medication Side Effects: None  Allergies:  Allergies  Allergen Reactions   Prochlorperazine Other (See Comments)    lockjaw Other reaction(s): Other   Sumatriptan Succinate Anaphylaxis and Other (See Comments)    ALL TRIPTAN MEDS   Triptans Anaphylaxis and Other (See Comments)    Pt states throat feels as if it is closing.   Ketamine Other (See Comments)    "Pt. Reports she never wants again." major hallucinations   Reglan [Metoclopramide] Other (See Comments)    "crawling out of my skin"    Past Medical History:  Diagnosis Date   ADHD (attention deficit hyperactivity disorder)    Agoraphobia    Basal cell carcinoma    Bipolar 1 disorder ([place])    patient denies   Chronic pain    just migraines per patient   Endometriosis determined by laparoscopy    GERD (gastroesophageal reflux disease)    High platelet count    Migraine    Personality disorder ([place])    Right adrenal mass ([place])    per ct 09-06-2015   Right ovarian cyst    Vaginal Pap smear, abnormal     Past Medical History, Surgical history, Social history, and Family history were reviewed and updated as appropriate.   Please see review of systems for further details on the patient's review from today.   Objective:   Physical Exam:  BP 140/86    Pulse 65   Physical Exam Constitutional:      General: She is not in acute distress. Musculoskeletal:        General: No deformity.  Neurological:     Mental Status: She is alert and oriented to person, place, and time.     Coordination: Coordination normal.  Psychiatric:        Attention and Perception: Attention and perception  normal. She does not perceive auditory or visual hallucinations.        Mood and Affect: Mood is depressed. Affect is not labile, blunt, angry or inappropriate.        Speech: Speech normal.        Behavior: Behavior normal.        Thought Content: Thought content normal. Thought content is not paranoid or delusional. Thought content does not include homicidal or suicidal ideation. Thought content does not include homicidal or suicidal plan.        Cognition and Memory: Cognition and memory normal.        Judgment: Judgment normal.     Comments: Insight intact Anxiety about health    Lab Review:     Component Value Date/Time   NA 138 12/26/2019 1515   K 4.6 12/26/2019 1515   CL 98 12/26/2019 1515   CO2 30  12/26/2019 1515   GLUCOSE 75 12/26/2019 1515   BUN 12 12/26/2019 1515   CREATININE 0.87 12/26/2019 1515   CALCIUM 10.6 (H) 12/26/2019 1515   PROT 7.6 12/26/2019 1515   ALBUMIN 4.5 12/26/2019 1515   AST 13 12/26/2019 1515   ALT 8 12/26/2019 1515   ALKPHOS 60 12/26/2019 1515   BILITOT 0.3 12/26/2019 1515   GFRNONAA >60 08/05/2018 0616   GFRAA >60 08/05/2018 0616       Component Value Date/Time   WBC 6.5 08/06/2018 0533   RBC 4.05 08/06/2018 0533   HGB 13.0 08/06/2018 0533   HCT 37.6 08/06/2018 0533   PLT 268 08/06/2018 0533   MCV 92.8 08/06/2018 0533   MCH 32.1 08/06/2018 0533   MCHC 34.6 08/06/2018 0533   RDW 12.2 08/06/2018 0533   LYMPHSABS 2.7 08/06/2018 0533   MONOABS 0.5 08/06/2018 0533   EOSABS 0.2 08/06/2018 0533   BASOSABS 0.0 08/06/2018 0533    No results found for: POCLITH, LITHIUM   No results found for: PHENYTOIN, PHENOBARB, VALPROATE, CBMZ   .res Assessment: Plan:   Discussed potential benefits, risks, and side effects of increasing Wellbutrin XL to 300 mg po qd since she reports experiencing a partial improvement in depressive s/s with 150 mg dose. She reports that she would like to continue current dose at this time in the context of current health  issues.  Will continue Wellbutrin XL 150 mg po qd for depression.  Continue Klonopin 1 mg 1/2-1 tab po BID prn anxiety.  Continue Modafinil 200 mg po qd for off-label indication of ADD. Continue Ambien 5 mg po QHS for insomnia.  Pt to follow-up in 3 months or sooner if clinically indicated.  Patient advised to contact office with any questions, adverse effects, or acute worsening in signs and symptoms.   Maizee was seen today for follow-up.  Diagnoses and all orders for this visit:  Attention deficit hyperactivity disorder (ADHD), combined type -     modafinil (PROVIGIL) 200 MG tablet; Take 1 tablet (200 mg total) by mouth Simmons.  Major depressive disorder, recurrent episode, moderate (HCC) -     buPROPion (WELLBUTRIN XL) 150 MG 24 hr tablet; Take 1 tablet (150 mg total) by mouth Simmons.  PTSD (post-traumatic stress disorder) -     clonazePAM (KLONOPIN) 1 MG tablet; TAKE 1/2 TO 1 TABLET BY MOUTH TWICE Simmons AS NEEDED FOR ANXIETY  Insomnia due to other mental disorder -     zolpidem (AMBIEN) 5 MG tablet; TAKE 1 TABLET(5 MG) BY MOUTH AT BEDTIME AS NEEDED FOR SLEEP     Please see After Visit Summary for patient specific instructions.  Future Appointments  Date Time Provider [place]  05/07/2021 10:00 AM Thayer Headings, PMHNP [facility] None    No orders of the defined types were placed in this encounter.   -------------------------------

## 2021-02-07 DIAGNOSIS — C44612 Basal cell carcinoma of skin of right upper limb, including shoulder: Secondary | ICD-10-CM | POA: Diagnosis not present

## 2021-04-03 DIAGNOSIS — C44612 Basal cell carcinoma of skin of right upper limb, including shoulder: Secondary | ICD-10-CM | POA: Diagnosis not present

## 2021-04-03 DIAGNOSIS — Z481 Encounter for planned postprocedural wound closure: Secondary | ICD-10-CM | POA: Diagnosis not present

## 2021-04-03 DIAGNOSIS — C4491 Basal cell carcinoma of skin, unspecified: Secondary | ICD-10-CM | POA: Diagnosis not present

## 2021-05-07 ENCOUNTER — Encounter: Payer: Self-pay | Admitting: Family Medicine

## 2021-05-07 ENCOUNTER — Ambulatory Visit (INDEPENDENT_AMBULATORY_CARE_PROVIDER_SITE_OTHER): Payer: BC Managed Care – PPO | Admitting: Family Medicine

## 2021-05-07 ENCOUNTER — Ambulatory Visit: Payer: BC Managed Care – PPO | Admitting: Psychiatry

## 2021-05-07 ENCOUNTER — Encounter: Payer: Self-pay | Admitting: Psychiatry

## 2021-05-07 VITALS — BP 138/80 | HR 100 | Temp 99.7°F | Ht 65.0 in | Wt 131.2 lb

## 2021-05-07 DIAGNOSIS — F4310 Post-traumatic stress disorder, unspecified: Secondary | ICD-10-CM | POA: Diagnosis not present

## 2021-05-07 DIAGNOSIS — F99 Mental disorder, not otherwise specified: Secondary | ICD-10-CM

## 2021-05-07 DIAGNOSIS — H6123 Impacted cerumen, bilateral: Secondary | ICD-10-CM | POA: Diagnosis not present

## 2021-05-07 DIAGNOSIS — J302 Other seasonal allergic rhinitis: Secondary | ICD-10-CM | POA: Diagnosis not present

## 2021-05-07 DIAGNOSIS — F331 Major depressive disorder, recurrent, moderate: Secondary | ICD-10-CM

## 2021-05-07 DIAGNOSIS — Z1211 Encounter for screening for malignant neoplasm of colon: Secondary | ICD-10-CM

## 2021-05-07 DIAGNOSIS — F5105 Insomnia due to other mental disorder: Secondary | ICD-10-CM

## 2021-05-07 MED ORDER — LEVOCETIRIZINE DIHYDROCHLORIDE 5 MG PO TABS: 5.0000 mg | ORAL_TABLET | Freq: Every evening | ORAL | 5 refills | Status: DC

## 2021-05-07 MED ORDER — ZOLPIDEM TARTRATE 5 MG PO TABS: ORAL_TABLET | ORAL | 3 refills | Status: DC

## 2021-05-07 MED ORDER — FLUTICASONE PROPIONATE 50 MCG/ACT NA SUSP: 2.0000 | Freq: Every day | NASAL | 2 refills | Status: DC

## 2021-05-07 MED ORDER — BUPROPION HCL ER (XL) 300 MG PO TB24: 300.0000 mg | ORAL_TABLET | Freq: Every day | ORAL | 3 refills | Status: DC

## 2021-05-07 MED ORDER — CLONAZEPAM 1 MG PO TABS: ORAL_TABLET | ORAL | 2 refills | Status: DC

## 2021-05-07 NOTE — Patient Instructions (Signed)
Claritin (loratadine), Allegra (fexofenadine), Zyrtec (cetirizine) which is also equivalent to Xyzal (levocetirizine); these are listed in order from weakest to strongest. Generic, and therefore cheaper, options are in the parentheses.  ? ?Flonase (fluticasone); nasal spray that is over the counter. 2 sprays each nostril, once daily. Aim towards the same side eye when you spray. ? ?There are available OTC, and the generic versions, which may be cheaper, are in parentheses. Show this to a pharmacist if you have trouble finding any of these items. ? ?If you do not hear anything about your referral in the next 1-2 weeks, call our office and ask for an update. ? ?Call [facility] [place] at [facility] at [phone] for an appointment. ? ?They are located at [address], [place], [place], [address] (right across the hall from our office). ? ?Greater than 30 minutes were spent with the patient in addition to reviewing their chart information on the same day of the visit.  ?

## 2021-05-07 NOTE — Progress Notes (Signed)
Laura Simmons ?[id number] ?[birth year] ?46 y.o. ? ?Subjective:  ? ?Patient ID:  Laura Simmons is a 46 y.o. (DOB 1975/05/06) female. ? ?Chief Complaint:  ?Chief Complaint  ?Patient presents with  ? Anxiety  ? Depression  ? ? ?HPI ?[name] presents to the office today for follow-up of depression, anxiety, and insomnia. She had skin cancer removed. She reports that she learned that the father of her deceased fiance committed suicide. Fiance's brother, Laura Simmons, found his father and his brother after they committed suicide. She went to the house to [place] and this triggered memories about her fiance. She reports that she is worried that Laura Simmons may commit suicide. She had a panic attack when she heard about the suicide- "now I'm just sad and angry." She reports that her mood is "ok, considering." Sleeping ok. Energy and motivation have been low. Appetite has been good. She reports that she is eating more sweets. Concentration has been poor. She reports difficulty remembering what she was about to say. She reports that she will forget things and have to go back inside to get them. She reports that she has to set multiple alarms to prompt her to get ready, when to leave, etc. Denies SI.  ? ?Parents are doing ok.  ? ?Past Psychiatric Medication Trials: ?Zyprexa- worsening depression ?Trileptal- benefits did not outweigh risks ?Ritalin ?Remeron ?Zoloft ?Celexa ?Lexapro ?Trintellix- Severe n/v ?Wellbutrin ?Nortriptyline ?Strattera-no improvement ?Seroquel- RLS ?Rexulti- Took briefly and may have been slightly helpful. ?Depakote ?Gabapentin ?Propranolol-fatigue ?Topamax ?Trazodone- excessive daytime somnolence ?Klonopin ?Xanax ?Ativan ?Ambien ?May have taken Lamictal ?[place] by insurance. Tolerated samples for 1-2 weeks.  ? ?PHQ2-9   ? ?[place] Office Visit from 03/25/2016 in [facility] at [facility]  ?PHQ-2 Total Score 0  ? ?  ?  ? ?Review of Systems:  ?Review of Systems   ?HENT:  Positive for sinus pressure and sinus pain.   ?Musculoskeletal:  Negative for gait problem.  ?Skin:   ?     Had skin cancer removed from shoulder.   ?Neurological:  Negative for tremors.  ?Psychiatric/Behavioral:    ?     Please refer to HPI  ? ?Medications: I have reviewed the patient's current medications. ? ?Current Outpatient Medications  ?Medication Sig Dispense Refill  ? calcium carbonate (TUMS - DOSED IN MG ELEMENTAL CALCIUM) 500 MG chewable tablet Chew 1 tablet by mouth as needed for indigestion or heartburn.    ? dimenhyDRINATE (DRAMAMINE) 50 MG tablet Take 50 mg by mouth at bedtime as needed for nausea.    ? diphenhydrAMINE (BENADRYL) 25 MG tablet Take 25 mg by mouth every 6 (six) hours as needed.    ? modafinil (PROVIGIL) 200 MG tablet Take 1 tablet (200 mg total) by mouth daily. 30 tablet 3  ? Multiple Vitamin (MULTIVITAMIN WITH MINERALS) TABS tablet Take 1 tablet by mouth daily. 30 tablet 1  ? polyethylene glycol (MIRALAX / GLYCOLAX) 17 g packet Take 17 g by mouth daily.    ? buPROPion (WELLBUTRIN XL) 300 MG 24 hr tablet Take 1 tablet (300 mg total) by mouth daily. 30 tablet 3  ? butalbital-acetaminophen-caffeine (FIORICET) 50-325-40 MG tablet TAKE 1 TO 2 TABLETS BY MOUTH EVERY 6 HOURS AS NEEDED FOR HEADACHE 20 tablet 0  ? [START ON 06/19/2021] clonazePAM (KLONOPIN) 1 MG tablet TAKE 1/2 TO 1 TABLET BY MOUTH TWICE DAILY AS NEEDED FOR ANXIETY 60 tablet 2  ? levocetirizine (XYZAL) 5 MG tablet Take 1 tablet (5 mg  total) by mouth every evening. 30 tablet 5  ? montelukast (SINGULAIR) 10 MG tablet Take 1 tablet (10 mg total) by mouth at bedtime. 30 tablet 3  ? pantoprazole (PROTONIX) 20 MG tablet Take 2 tablets (40 mg total) by mouth daily. (Patient taking differently: Take 20 mg by mouth daily as needed.) 60 tablet 0  ? zolpidem (AMBIEN) 5 MG tablet TAKE 1 TABLET(5 MG) BY MOUTH AT BEDTIME AS NEEDED FOR SLEEP 30 tablet 3  ? ?No current facility-administered medications for this visit.  ? ? ?Medication  Side Effects: None ? ?Allergies:  ?Allergies  ?Allergen Reactions  ? Prochlorperazine Other (See Comments)  ?  lockjaw ?Other reaction(s): Other  ? Sumatriptan Succinate Anaphylaxis and Other (See Comments)  ?  ALL TRIPTAN MEDS  ? Triptans Anaphylaxis and Other (See Comments)  ?  Pt states throat feels as if it is closing.  ? Ketamine Other (See Comments)  ?  "Pt. Reports she never wants again." major hallucinations  ? Reglan [Metoclopramide] Other (See Comments)  ?  "crawling out of my skin"  ? ? ?Past Medical History:  ?Diagnosis Date  ? ADHD (attention deficit hyperactivity disorder)   ? Agoraphobia   ? Basal cell carcinoma   ? Bipolar 1 disorder ([place])   ? patient denies  ? Chronic pain   ? just migraines per patient  ? Endometriosis determined by laparoscopy   ? GERD (gastroesophageal reflux disease)   ? High platelet count   ? Migraine   ? Personality disorder ([place])   ? Right adrenal mass ([place])   ? per ct 09-06-2015  ? Right ovarian cyst   ? Vaginal Pap smear, abnormal   ? ? ?Past Medical History, Surgical history, Social history, and Family history were reviewed and updated as appropriate.  ? ?Please see review of systems for further details on the patient's review from today.  ? ?Objective:  ? ?Physical Exam:  ?BP 135/89   Pulse 78  ? ?Physical Exam ?Constitutional:   ?   General: She is not in acute distress. ?Musculoskeletal:     ?   General: No deformity.  ?Neurological:  ?   Mental Status: She is alert and oriented to person, place, and time.  ?   Coordination: Coordination normal.  ?Psychiatric:     ?   Attention and Perception: Attention and perception normal. She does not perceive auditory or visual hallucinations.     ?   Mood and Affect: Mood is anxious. Mood is not depressed. Affect is not labile, blunt, angry or inappropriate.     ?   Speech: Speech normal.     ?   Behavior: Behavior normal.     ?   Thought Content: Thought content normal. Thought content is not paranoid or delusional. Thought  content does not include homicidal or suicidal ideation. Thought content does not include homicidal or suicidal plan.     ?   Cognition and Memory: Cognition and memory normal.     ?   Judgment: Judgment normal.  ?   Comments: Insight intact  ? ? ?Lab Review:  ?   ?Component Value Date/Time  ? NA 138 12/26/2019 1515  ? K 4.6 12/26/2019 1515  ? CL 98 12/26/2019 1515  ? CO2 30 12/26/2019 1515  ? GLUCOSE 75 12/26/2019 1515  ? BUN 12 12/26/2019 1515  ? CREATININE 0.87 12/26/2019 1515  ? CALCIUM 10.6 (H) 12/26/2019 1515  ? PROT 7.6 12/26/2019 1515  ? ALBUMIN 4.5 12/26/2019  1515  ? AST 13 12/26/2019 1515  ? ALT 8 12/26/2019 1515  ? ALKPHOS 60 12/26/2019 1515  ? BILITOT 0.3 12/26/2019 1515  ? GFRNONAA >60 08/05/2018 0616  ? GFRAA >60 08/05/2018 0616  ? ? ?   ?Component Value Date/Time  ? WBC 6.5 08/06/2018 0533  ? RBC 4.05 08/06/2018 0533  ? HGB 13.0 08/06/2018 0533  ? HCT 37.6 08/06/2018 0533  ? PLT 268 08/06/2018 0533  ? MCV 92.8 08/06/2018 0533  ? MCH 32.1 08/06/2018 0533  ? MCHC 34.6 08/06/2018 0533  ? RDW 12.2 08/06/2018 0533  ? LYMPHSABS 2.7 08/06/2018 0533  ? MONOABS 0.5 08/06/2018 0533  ? EOSABS 0.2 08/06/2018 0533  ? BASOSABS 0.0 08/06/2018 0533  ? ? ?No results found for: POCLITH, LITHIUM  ? ?No results found for: PHENYTOIN, PHENOBARB, VALPROATE, CBMZ  ? ?.res ?Assessment: Plan:   ?Discussed potential benefits, risks, and side effects of increasing Wellbutrin XL to 300 mg daily. Pt agrees to trial of Wellbutrin XL 300 mg daily for depression.  ?Continue Klonopin 1 mg 1/2-1 tab po BID prn anxiety.  ?Continue Modafinil 200 mg po qd for off-label indication of ADD. ?Continue Ambien 5 mg po QHS for insomnia.  ?Discussed that it may be helpful to resume therapy with Lina Sayre, [facility].  ?Pt to follow-up in 3 months or sooner if clinically indicated.  ?Patient advised to contact office with any questions, adverse effects, or acute worsening in signs and symptoms. ? ? ? ?Abrea was seen today for anxiety and  depression. ? ?Diagnoses and all orders for this visit: ? ?Major depressive disorder, recurrent episode, moderate (HCC) ?-     buPROPion (WELLBUTRIN XL) 300 MG 24 hr tablet; Take 1 tablet (300 mg total) by mouth dail

## 2021-05-07 NOTE — Progress Notes (Signed)
Chief Complaint  ?Patient presents with  ? Allergies  ? Ear Fullness  ? ? ?Quenten Raven here for URI complaints. ? ?Duration: 7 days worsened ?Associated symptoms: sinus congestion, rhinorrhea, itchy watery eyes, and ear fullness ?Denies: sinus pain, ear drainage, sore throat, wheezing, shortness of breath, and fevers, coughing ?Treatment to date: Benadryl; Claritin helped in past ?Sick contacts: No ? ?Past Medical History:  ?Diagnosis Date  ? ADHD (attention deficit hyperactivity disorder)   ? Agoraphobia   ? Basal cell carcinoma   ? Bipolar 1 disorder ([place])   ? patient denies  ? Chronic pain   ? just migraines per patient  ? Endometriosis determined by laparoscopy   ? GERD (gastroesophageal reflux disease)   ? High platelet count   ? Migraine   ? Personality disorder ([place])   ? Right adrenal mass ([place])   ? per ct 09-06-2015  ? Right ovarian cyst   ? Vaginal Pap smear, abnormal   ? ? ?Objective ?BP 138/80   Pulse 100   Temp 99.7 ?F (37.6 ?C) (Oral)   Ht '5\' 5"'$  (1.651 m)   Wt 131 lb 4 oz (59.5 kg)   SpO2 96%   BMI 21.84 kg/m?  ?General: Awake, alert, appears stated age ?HEENT: AT, [place], 100% and 80% obstructed w cerumen on L and R respectively prior to flush, after irrigation, ears patent b/l and TM's neg, nares patent w/o discharge, pharynx pink and without exudates, MMM ?Neck: No masses or asymmetry ?Heart: RRR ?Lungs: CTAB, no accessory muscle use ?Psych: Age appropriate judgment and insight, normal mood and affect ? ?Seasonal allergies - Plan: levocetirizine (XYZAL) 5 MG tablet, fluticasone (FLONASE) 50 MCG/ACT nasal spray ? ?Screen for colon cancer - Plan: Ambulatory referral to Gastroenterology ? ?Bilateral impacted cerumen ? ?Continue to push fluids, practice good hand hygiene, cover mouth when coughing. ?Ears successfully irrigated today, pt reported immediate improvement w no AE's.  ?F/u prn. If starting to experience fevers, shaking, or shortness of breath, seek immediate care. ?Pt voiced  understanding and agreement to the plan. ? ?Shelda Pal, DO ?05/07/21 ?4:19 PM ? ?

## 2021-05-08 ENCOUNTER — Telehealth: Payer: Self-pay | Admitting: Family Medicine

## 2021-05-08 DIAGNOSIS — R519 Headache, unspecified: Secondary | ICD-10-CM

## 2021-05-08 MED ORDER — BUTALBITAL-APAP-CAFFEINE 50-325-40 MG PO TABS: ORAL_TABLET | ORAL | 0 refills | Status: DC

## 2021-05-08 MED ORDER — MONTELUKAST SODIUM 10 MG PO TABS: 10.0000 mg | ORAL_TABLET | Freq: Every day | ORAL | 3 refills | Status: DC

## 2021-05-08 NOTE — Telephone Encounter (Signed)
Pt states she thinks she was supposed to be prescribed 3 medications from [place]. she did not know how to spell the name but stated it started with an f. She also stated the flonase is too expensive and would like to see what can be done.  ? ?[id number] [address], [address] AT [facility] [address]  ?[place], [place] [address]  ?Phone:  [phone]  Fax:  [phone]  ?

## 2021-05-08 NOTE — Telephone Encounter (Signed)
Sent Singulair to replace Flonase given cost. Refilled Fiorcet just now. Ty.  ?

## 2021-05-08 NOTE — Telephone Encounter (Signed)
Called Pt and was advised  ?

## 2021-07-29 ENCOUNTER — Other Ambulatory Visit: Payer: Self-pay | Admitting: Family Medicine

## 2021-07-29 DIAGNOSIS — G8929 Other chronic pain: Secondary | ICD-10-CM

## 2021-07-29 MED ORDER — BUTALBITAL-APAP-CAFFEINE 50-325-40 MG PO TABS: ORAL_TABLET | ORAL | 0 refills | Status: DC

## 2021-07-29 NOTE — Addendum Note (Signed)
Addended by: Ames Coupe on: 07/29/2021 12:43 PM   Modules accepted: Orders

## 2021-07-29 NOTE — Addendum Note (Signed)
Addended by: Sharon Seller B on: 07/29/2021 12:39 PM   Modules accepted: Orders

## 2021-07-31 DIAGNOSIS — N760 Acute vaginitis: Secondary | ICD-10-CM | POA: Diagnosis not present

## 2021-07-31 DIAGNOSIS — Z113 Encounter for screening for infections with a predominantly sexual mode of transmission: Secondary | ICD-10-CM | POA: Diagnosis not present

## 2021-07-31 DIAGNOSIS — Z124 Encounter for screening for malignant neoplasm of cervix: Secondary | ICD-10-CM | POA: Diagnosis not present

## 2021-07-31 DIAGNOSIS — Z01419 Encounter for gynecological examination (general) (routine) without abnormal findings: Secondary | ICD-10-CM | POA: Diagnosis not present

## 2021-07-31 DIAGNOSIS — Z1151 Encounter for screening for human papillomavirus (HPV): Secondary | ICD-10-CM | POA: Diagnosis not present

## 2021-07-31 DIAGNOSIS — R8290 Unspecified abnormal findings in urine: Secondary | ICD-10-CM | POA: Diagnosis not present

## 2021-07-31 DIAGNOSIS — Z6821 Body mass index (BMI) 21.0-21.9, adult: Secondary | ICD-10-CM | POA: Diagnosis not present

## 2021-07-31 LAB — RESULTS CONSOLE HPV: CHL HPV: NEGATIVE

## 2021-08-06 ENCOUNTER — Ambulatory Visit (INDEPENDENT_AMBULATORY_CARE_PROVIDER_SITE_OTHER): Payer: BC Managed Care – PPO | Admitting: Psychiatry

## 2021-08-06 ENCOUNTER — Encounter: Payer: Self-pay | Admitting: Psychiatry

## 2021-08-06 DIAGNOSIS — F5105 Insomnia due to other mental disorder: Secondary | ICD-10-CM

## 2021-08-06 DIAGNOSIS — F4310 Post-traumatic stress disorder, unspecified: Secondary | ICD-10-CM | POA: Diagnosis not present

## 2021-08-06 DIAGNOSIS — F99 Mental disorder, not otherwise specified: Secondary | ICD-10-CM

## 2021-08-06 DIAGNOSIS — F902 Attention-deficit hyperactivity disorder, combined type: Secondary | ICD-10-CM | POA: Diagnosis not present

## 2021-08-06 MED ORDER — MODAFINIL 200 MG PO TABS: 200.0000 mg | ORAL_TABLET | Freq: Every day | ORAL | 3 refills | Status: DC

## 2021-08-06 MED ORDER — ZOLPIDEM TARTRATE 5 MG PO TABS: ORAL_TABLET | ORAL | 3 refills | Status: DC

## 2021-08-06 MED ORDER — CLONAZEPAM 1 MG PO TABS
ORAL_TABLET | ORAL | 2 refills | Status: DC
Start: 2021-09-22 — End: 2021-11-06

## 2021-08-06 NOTE — Progress Notes (Signed)
gHeather Thinnes [id number] November 24, 1975 46 y.o.  Subjective:   Patient ID:  Laura Simmons is a 46 y.o. (DOB Jan 20, 1975) female.  Chief Complaint:  Chief Complaint  Patient presents with   Follow-up    Anxiety, depression, insomnia, and ADHD    HPI Laura Simmons presents to the office today for follow-up of depression, anxiety, ADHD, and insomnia.   Her mother fell at church and had a reverse shoulder replacement. She reports that her parents have been needing her help and it has helped her to feel needed and have a purpose.   She reports that Wellbutrin did not seem to help with her mood. She noticed her mood improved with the warmer temperatures and longer days. Denies current depressed mood. She reports that her anxiety has been manageable. She has noticed she is not needing Klonopin prn quite as often. Sleep amount varies from 6-8 hours. Energy and motivation "comes and goes." Continues to have difficulty with concentration and uses different coping strategies. Denies SI.   She saw an obgyn about endometriosis and has an ultrasound coming up. She is thinking about a partial hysterectomy and that this brings up feelings of "I missed out on something."   Klonopin last filled 07/28/21 x2 Ambien last filled 07/12/21 x2  Modafinil last filled 05/19/21  Past Psychiatric Medication Trials: Zyprexa- worsening depression Trileptal- benefits did not outweigh risks Ritalin Remeron Zoloft Celexa Lexapro Trintellix- Severe n/v Wellbutrin Nortriptyline Strattera-no improvement Seroquel- RLS Rexulti- Took briefly and may have been slightly helpful. Depakote Gabapentin Propranolol-fatigue Topamax Trazodone- excessive daytime somnolence Klonopin Xanax Ativan Ambien May have taken Lamictal Qelbree- Denied by insurance. Tolerated samples for 1-2 weeks.   [place] Office Visit from 03/25/2016 in [place] at [facility]  PHQ-2 Total Score  0        Review of Systems:  Review of Systems  Genitourinary:  Positive for menstrual problem and pelvic pain.  Musculoskeletal:  Positive for back pain. Negative for gait problem.  Psychiatric/Behavioral:         Please refer to HPI    Medications: I have reviewed the patient's current medications.  Current Outpatient Medications  Medication Sig Dispense Refill   butalbital-acetaminophen-caffeine (FIORICET) 50-325-40 MG tablet TAKE 1 TO 2 TABLETS BY MOUTH EVERY 6 HOURS AS NEEDED FOR HEADACHE 20 tablet 0   calcium carbonate (TUMS - DOSED IN MG ELEMENTAL CALCIUM) 500 MG chewable tablet Chew 1 tablet by mouth as needed for indigestion or heartburn.     dimenhyDRINATE (DRAMAMINE) 50 MG tablet Take 50 mg by mouth at bedtime as needed for nausea.     diphenhydrAMINE (BENADRYL) 25 MG tablet Take 25 mg by mouth every 6 (six) hours as needed.     ibuprofen (ADVIL) 100 MG tablet Take 100 mg by mouth every 6 (six) hours as needed for fever.     levocetirizine (XYZAL) 5 MG tablet Take 1 tablet (5 mg total) by mouth every evening. 30 tablet 5   Multiple Vitamin (MULTIVITAMIN WITH MINERALS) TABS tablet Take 1 tablet by mouth daily. 30 tablet 1   polyethylene glycol (MIRALAX / GLYCOLAX) 17 g packet Take 17 g by mouth daily.     [START ON 09/22/2021] clonazePAM (KLONOPIN) 1 MG tablet TAKE 1/2 TO 1 TABLET BY MOUTH TWICE DAILY AS NEEDED FOR ANXIETY 60 tablet 2   cyclobenzaprine (FLEXERIL) 5 MG tablet Take 5 mg by mouth at bedtime.     modafinil (PROVIGIL) 200 MG tablet Take  1 tablet (200 mg total) by mouth daily. 30 tablet 3   montelukast (SINGULAIR) 10 MG tablet Take 1 tablet (10 mg total) by mouth at bedtime. (Patient not taking: Reported on 08/06/2021) 30 tablet 3   pantoprazole (PROTONIX) 20 MG tablet Take 2 tablets (40 mg total) by mouth daily. (Patient taking differently: Take 20 mg by mouth daily as needed.) 60 tablet 0   [START ON 10/04/2021] zolpidem (AMBIEN) 5 MG tablet TAKE 1 TABLET(5 MG) BY  MOUTH AT BEDTIME AS NEEDED FOR SLEEP 30 tablet 3   No current facility-administered medications for this visit.    Medication Side Effects: None  Allergies:  Allergies  Allergen Reactions   Prochlorperazine Other (See Comments)    lockjaw Other reaction(s): Other   Sumatriptan Succinate Anaphylaxis and Other (See Comments)    ALL TRIPTAN MEDS   Triptans Anaphylaxis and Other (See Comments)    Pt states throat feels as if it is closing.   Ketamine Other (See Comments)    "Pt. Reports she never wants again." major hallucinations   Reglan [Metoclopramide] Other (See Comments)    "crawling out of my skin"    Past Medical History:  Diagnosis Date   ADHD (attention deficit hyperactivity disorder)    Agoraphobia    Basal cell carcinoma    Bipolar 1 disorder ([place])    patient denies   Chronic pain    just migraines per patient   Endometriosis determined by laparoscopy    GERD (gastroesophageal reflux disease)    High platelet count    Migraine    Personality disorder ([place])    Right adrenal mass ([place])    per ct 09-06-2015   Right ovarian cyst    Vaginal Pap smear, abnormal     Past Medical History, Surgical history, Social history, and Family history were reviewed and updated as appropriate.   Please see review of systems for further details on the patient's review from today.   Objective:   Physical Exam:  BP (!) 144/74   Pulse 69   Physical Exam Constitutional:      General: She is not in acute distress. Musculoskeletal:        General: No deformity.  Neurological:     Mental Status: She is alert and oriented to person, place, and time.     Coordination: Coordination normal.  Psychiatric:        Attention and Perception: Attention and perception normal. She does not perceive auditory or visual hallucinations.        Mood and Affect: Mood is not anxious or depressed. Affect is not labile, blunt, angry or inappropriate.        Speech: Speech normal.         Behavior: Behavior normal.        Thought Content: Thought content normal. Thought content is not paranoid or delusional. Thought content does not include homicidal or suicidal ideation. Thought content does not include homicidal or suicidal plan.        Cognition and Memory: Cognition and memory normal.        Judgment: Judgment normal.     Comments: Insight intact Mood is appropriate to content and affect is congruent     Lab Review:     Component Value Date/Time   NA 138 12/26/2019 1515   K 4.6 12/26/2019 1515   CL 98 12/26/2019 1515   CO2 30 12/26/2019 1515   GLUCOSE 75 12/26/2019 1515   BUN 12 12/26/2019 1515  CREATININE 0.87 12/26/2019 1515   CALCIUM 10.6 (H) 12/26/2019 1515   PROT 7.6 12/26/2019 1515   ALBUMIN 4.5 12/26/2019 1515   AST 13 12/26/2019 1515   ALT 8 12/26/2019 1515   ALKPHOS 60 12/26/2019 1515   BILITOT 0.3 12/26/2019 1515   GFRNONAA >60 08/05/2018 0616   GFRAA >60 08/05/2018 0616       Component Value Date/Time   WBC 6.5 08/06/2018 0533   RBC 4.05 08/06/2018 0533   HGB 13.0 08/06/2018 0533   HCT 37.6 08/06/2018 0533   PLT 268 08/06/2018 0533   MCV 92.8 08/06/2018 0533   MCH 32.1 08/06/2018 0533   MCHC 34.6 08/06/2018 0533   RDW 12.2 08/06/2018 0533   LYMPHSABS 2.7 08/06/2018 0533   MONOABS 0.5 08/06/2018 0533   EOSABS 0.2 08/06/2018 0533   BASOSABS 0.0 08/06/2018 0533    No results found for: "POCLITH", "LITHIUM"   No results found for: "PHENYTOIN", "PHENOBARB", "VALPROATE", "CBMZ"   .res Assessment: Plan:   Will continue current plan of care since target signs and symptoms are well controlled without any tolerability issues. Continue Klonopin 1 mg 1/2-1 tab po BID prn anxiety.  Continue Modafinil 200 mg po qd for off-label indication of ADD. Continue Ambien 5 mg po QHS for insomnia.  Pt to follow-up in 3 months or sooner if clinically indicated.  Patient advised to contact office with any questions, adverse effects, or acute worsening  in signs and symptoms.  Laura Simmons was seen today for follow-up.  Diagnoses and all orders for this visit:  PTSD (post-traumatic stress disorder) -     clonazePAM (KLONOPIN) 1 MG tablet; TAKE 1/2 TO 1 TABLET BY MOUTH TWICE DAILY AS NEEDED FOR ANXIETY  Insomnia due to other mental disorder -     zolpidem (AMBIEN) 5 MG tablet; TAKE 1 TABLET(5 MG) BY MOUTH AT BEDTIME AS NEEDED FOR SLEEP  Attention deficit hyperactivity disorder (ADHD), combined type -     modafinil (PROVIGIL) 200 MG tablet; Take 1 tablet (200 mg total) by mouth daily.     Please see After Visit Summary for patient specific instructions.  Future Appointments  Date Time Provider [place]  11/06/2021 10:00 AM Thayer Headings, PMHNP CP-CP None    No orders of the defined types were placed in this encounter.   -------------------------------

## 2021-08-27 DIAGNOSIS — R102 Pelvic and perineal pain: Secondary | ICD-10-CM | POA: Diagnosis not present

## 2021-10-17 ENCOUNTER — Other Ambulatory Visit: Payer: Self-pay | Admitting: Family Medicine

## 2021-10-17 DIAGNOSIS — R519 Headache, unspecified: Secondary | ICD-10-CM

## 2021-10-17 MED ORDER — BUTALBITAL-APAP-CAFFEINE 50-325-40 MG PO TABS: ORAL_TABLET | ORAL | 0 refills | Status: DC

## 2021-11-06 ENCOUNTER — Ambulatory Visit: Payer: BC Managed Care – PPO | Admitting: Psychiatry

## 2021-11-06 ENCOUNTER — Encounter: Payer: Self-pay | Admitting: Psychiatry

## 2021-11-06 VITALS — BP 144/81 | HR 70

## 2021-11-06 DIAGNOSIS — F5105 Insomnia due to other mental disorder: Secondary | ICD-10-CM | POA: Diagnosis not present

## 2021-11-06 DIAGNOSIS — F902 Attention-deficit hyperactivity disorder, combined type: Secondary | ICD-10-CM | POA: Diagnosis not present

## 2021-11-06 DIAGNOSIS — F99 Mental disorder, not otherwise specified: Secondary | ICD-10-CM

## 2021-11-06 DIAGNOSIS — F4310 Post-traumatic stress disorder, unspecified: Secondary | ICD-10-CM

## 2021-11-06 MED ORDER — CLONAZEPAM 1 MG PO TABS: ORAL_TABLET | ORAL | 2 refills | Status: DC

## 2021-11-06 MED ORDER — LISDEXAMFETAMINE DIMESYLATE 30 MG PO CAPS: 30.0000 mg | ORAL_CAPSULE | Freq: Every day | ORAL | 0 refills | Status: DC

## 2021-11-06 MED ORDER — ZOLPIDEM TARTRATE 5 MG PO TABS: ORAL_TABLET | ORAL | 3 refills | Status: DC

## 2021-11-06 NOTE — Progress Notes (Signed)
Laura Simmons [id number] [birth year] 46 y.o.  Subjective:   Patient ID:  Laura Simmons is a 46 y.o. (DOB 1975-06-04) female.  Chief Complaint:  Chief Complaint  Patient presents with   ADHD    HPI Laura Simmons presents to the office today for follow-up of depression, anxiety, insomnia, and ADHD. She reports that she is, "about the same. Not much has changed." She notices her mood is somewhat lower with transition into fall. "It's not that I am sad, I just don't feel like talking with anybody." She reports that her motivation remains low. She reports difficulty with concentration. She reports that her anxiety is "ok because I don't really go and do things." She reports some anxiety with driving at night. She reports that her sleep is "ok." Sleeping from 10 pm until 7:30 am. Appetite has been ok. Denies SI.   She reports that her vehicle is over 28 years old and is not reliable. Parents are doing "ok" and are 69 and 42 yo.   Had her hair cut in August and is not happy with it and this has affected her self-image.   Taking Klonopin about one a day and occasionally 2 tabs, such as when she is out of the house.   Klonopin last filled 10/26/21 x 2 Ambien last filled 10/08/21 x 3 Modafinil last filled 09/26/21 x 1  Past Psychiatric Medication Trials: Zyprexa- worsening depression Trileptal- benefits did not outweigh risks Remeron Zoloft Celexa Lexapro Trintellix- Severe n/v Wellbutrin Nortriptyline Ritalin Strattera-no improvement Qelbree- Denied by insurance. Tolerated samples for 1-2 weeks.  Seroquel- RLS Rexulti- Took briefly and may have been slightly helpful. Depakote Gabapentin Propranolol-fatigue Topamax Trazodone- excessive daytime somnolence Klonopin Xanax Ativan Ambien May have taken Lamictal   PHQ2-9    [place] Office Visit from 03/25/2016 in [place] at [facility]  PHQ-2 Total Score 0        Review of Systems:   Review of Systems  Cardiovascular:  Negative for chest pain and palpitations.  Genitourinary:        She has been given option of partial hysterectomy or starting [place], which would cost her over $400 a month.   Musculoskeletal:  Negative for gait problem.  Psychiatric/Behavioral:         Please refer to HPI    Medications: I have reviewed the patient's current medications.  Current Outpatient Medications  Medication Sig Dispense Refill   butalbital-acetaminophen-caffeine (FIORICET) 50-325-40 MG tablet TAKE 1 TO 2 TABLETS BY MOUTH EVERY 6 HOURS AS NEEDED FOR HEADACHE 20 tablet 0   calcium carbonate (TUMS - DOSED IN MG ELEMENTAL CALCIUM) 500 MG chewable tablet Chew 1 tablet by mouth as needed for indigestion or heartburn.     dimenhyDRINATE (DRAMAMINE) 50 MG tablet Take 50 mg by mouth at bedtime as needed for nausea.     diphenhydrAMINE (BENADRYL) 25 MG tablet Take 25 mg by mouth every 6 (six) hours as needed.     ibuprofen (ADVIL) 100 MG tablet Take 100 mg by mouth every 6 (six) hours as needed for fever.     levocetirizine (XYZAL) 5 MG tablet Take 1 tablet (5 mg total) by mouth every evening. 30 tablet 5   lisdexamfetamine (VYVANSE) 30 MG capsule Take 1 capsule (30 mg total) by mouth daily. 30 capsule 0   modafinil (PROVIGIL) 200 MG tablet Take 1 tablet (200 mg total) by mouth daily. 30 tablet 3   Multiple Vitamin (MULTIVITAMIN WITH MINERALS) TABS tablet Take 1 tablet  by mouth daily. 30 tablet 1   polyethylene glycol (MIRALAX / GLYCOLAX) 17 g packet Take 17 g by mouth daily as needed.     [START ON 12/21/2021] clonazePAM (KLONOPIN) 1 MG tablet TAKE 1/2 TO 1 TABLET BY MOUTH TWICE DAILY AS NEEDED FOR ANXIETY 60 tablet 2   cyclobenzaprine (FLEXERIL) 5 MG tablet Take 5 mg by mouth at bedtime. (Patient not taking: Reported on 11/06/2021)     montelukast (SINGULAIR) 10 MG tablet Take 1 tablet (10 mg total) by mouth at bedtime. (Patient not taking: Reported on 11/06/2021) 30 tablet 3    pantoprazole (PROTONIX) 20 MG tablet Take 2 tablets (40 mg total) by mouth daily. (Patient taking differently: Take 20 mg by mouth daily as needed.) 60 tablet 0   [START ON 12/04/2021] zolpidem (AMBIEN) 5 MG tablet TAKE 1 TABLET(5 MG) BY MOUTH AT BEDTIME AS NEEDED FOR SLEEP 30 tablet 3   No current facility-administered medications for this visit.    Medication Side Effects: None  Allergies:  Allergies  Allergen Reactions   Prochlorperazine Other (See Comments)    lockjaw Other reaction(s): Other   Sumatriptan Succinate Anaphylaxis and Other (See Comments)    ALL TRIPTAN MEDS   Triptans Anaphylaxis and Other (See Comments)    Pt states throat feels as if it is closing.   Ketamine Other (See Comments)    "Pt. Reports she never wants again." major hallucinations   Reglan [Metoclopramide] Other (See Comments)    "crawling out of my skin"    Past Medical History:  Diagnosis Date   ADHD (attention deficit hyperactivity disorder)    Agoraphobia    Basal cell carcinoma    Bipolar 1 disorder ([place])    patient denies   Chronic pain    just migraines per patient   Endometriosis determined by laparoscopy    GERD (gastroesophageal reflux disease)    High platelet count    Migraine    Personality disorder ([place])    Right adrenal mass ([place])    per ct 09-06-2015   Right ovarian cyst    Vaginal Pap smear, abnormal     Past Medical History, Surgical history, Social history, and Family history were reviewed and updated as appropriate.   Please see review of systems for further details on the patient's review from today.   Objective:   Physical Exam:  BP (!) 144/81   Pulse 70   Physical Exam Constitutional:      General: She is not in acute distress. Musculoskeletal:        General: No deformity.  Neurological:     Mental Status: She is alert and oriented to person, place, and time.     Coordination: Coordination normal.  Psychiatric:        Attention and Perception:  Attention and perception normal. She does not perceive auditory or visual hallucinations.        Mood and Affect: Mood is anxious and depressed. Affect is not labile, blunt, angry or inappropriate.        Speech: Speech normal.        Behavior: Behavior normal.        Thought Content: Thought content normal. Thought content is not paranoid or delusional. Thought content does not include homicidal or suicidal ideation. Thought content does not include homicidal or suicidal plan.        Cognition and Memory: Cognition and memory normal.        Judgment: Judgment normal.     Comments:  Insight intact     Lab Review:     Component Value Date/Time   NA 138 12/26/2019 1515   K 4.6 12/26/2019 1515   CL 98 12/26/2019 1515   CO2 30 12/26/2019 1515   GLUCOSE 75 12/26/2019 1515   BUN 12 12/26/2019 1515   CREATININE 0.87 12/26/2019 1515   CALCIUM 10.6 (H) 12/26/2019 1515   PROT 7.6 12/26/2019 1515   ALBUMIN 4.5 12/26/2019 1515   AST 13 12/26/2019 1515   ALT 8 12/26/2019 1515   ALKPHOS 60 12/26/2019 1515   BILITOT 0.3 12/26/2019 1515   GFRNONAA >60 08/05/2018 0616   GFRAA >60 08/05/2018 0616       Component Value Date/Time   WBC 6.5 08/06/2018 0533   RBC 4.05 08/06/2018 0533   HGB 13.0 08/06/2018 0533   HCT 37.6 08/06/2018 0533   PLT 268 08/06/2018 0533   MCV 92.8 08/06/2018 0533   MCH 32.1 08/06/2018 0533   MCHC 34.6 08/06/2018 0533   RDW 12.2 08/06/2018 0533   LYMPHSABS 2.7 08/06/2018 0533   MONOABS 0.5 08/06/2018 0533   EOSABS 0.2 08/06/2018 0533   BASOSABS 0.0 08/06/2018 0533    No results found for: "POCLITH", "LITHIUM"   No results found for: "PHENYTOIN", "PHENOBARB", "VALPROATE", "CBMZ"   .res Assessment: Plan:    Discussed potential benefits, risks, and side effects of Vyvanse. Reviewed potential benefits, risks, and side effects of stimulants with patient to include increased heart rate, palpitations, insomnia, increased anxiety, increased irritability, or decreased  appetite.  Instructed patient to contact office if experiencing any significant tolerability issues. She agrees to trial of Vyvanse 30 mg daily for ADHD. Continue Klonopin 1 mg 1/2-1 tab po BID prn anxiety.  Continue Modafinil 200 mg po qd prn.  Continue Ambien 5 mg po QHS prn insomnia.  Pt to follow-up in 3 months or sooner if clinically indicated.  Patient advised to contact office with any questions, adverse effects, or acute worsening in signs and symptoms.   Laura Simmons was seen today for adhd.  Diagnoses and all orders for this visit:  Attention deficit hyperactivity disorder (ADHD), combined type -     lisdexamfetamine (VYVANSE) 30 MG capsule; Take 1 capsule (30 mg total) by mouth daily.  PTSD (post-traumatic stress disorder) -     clonazePAM (KLONOPIN) 1 MG tablet; TAKE 1/2 TO 1 TABLET BY MOUTH TWICE DAILY AS NEEDED FOR ANXIETY  Insomnia due to other mental disorder -     zolpidem (AMBIEN) 5 MG tablet; TAKE 1 TABLET(5 MG) BY MOUTH AT BEDTIME AS NEEDED FOR SLEEP     Please see After Visit Summary for patient specific instructions.  Future Appointments  Date Time Provider [place]  02/06/2022 10:30 AM Thayer Headings, PMHNP CP-CP None    No orders of the defined types were placed in this encounter.   -------------------------------

## 2021-11-12 ENCOUNTER — Telehealth: Payer: Self-pay | Admitting: Psychiatry

## 2021-11-12 ENCOUNTER — Telehealth: Payer: Self-pay

## 2021-11-12 NOTE — Telephone Encounter (Signed)
Patient lvm stating she was seen in the office on 11/06/21 and prescribed a new medication. She stated pharmacy informed her she needed a PA. She is requesting clarification regarding this matter.  Contact # [phone]

## 2021-11-12 NOTE — Telephone Encounter (Signed)
Initiated PA

## 2021-11-12 NOTE — Telephone Encounter (Addendum)
Prior Authorization initiated Lisdexamfetamine Dimesylate '30MG'$  capsules #30 [employer]  Approved Effective from 11/12/2021 through 11/11/2022

## 2021-11-12 NOTE — Telephone Encounter (Signed)
There isn't anything in CMM. Will initiate one.

## 2021-11-12 NOTE — Telephone Encounter (Signed)
Received approval. Patient notified.

## 2021-12-15 ENCOUNTER — Telehealth: Payer: Self-pay | Admitting: Psychiatry

## 2021-12-15 ENCOUNTER — Other Ambulatory Visit: Payer: Self-pay

## 2021-12-15 DIAGNOSIS — F902 Attention-deficit hyperactivity disorder, combined type: Secondary | ICD-10-CM

## 2021-12-15 MED ORDER — LISDEXAMFETAMINE DIMESYLATE 30 MG PO CAPS: 30.0000 mg | ORAL_CAPSULE | Freq: Every day | ORAL | 0 refills | Status: DC

## 2021-12-15 NOTE — Telephone Encounter (Signed)
Pended.

## 2021-12-15 NOTE — Telephone Encounter (Signed)
RF- Vyvanse RX '30mg'$ , working well for her. Please RF it for her:  [facility] [place], [address] AT [facility] OF [address]  [address]

## 2022-01-06 ENCOUNTER — Other Ambulatory Visit: Payer: Self-pay | Admitting: Family Medicine

## 2022-01-06 DIAGNOSIS — G8929 Other chronic pain: Secondary | ICD-10-CM

## 2022-01-06 MED ORDER — BUTALBITAL-APAP-CAFFEINE 50-325-40 MG PO TABS: ORAL_TABLET | ORAL | 0 refills | Status: DC

## 2022-01-06 NOTE — Addendum Note (Signed)
Addended by: Sharon Seller B on: 01/06/2022 09:56 AM   Modules accepted: Orders

## 2022-01-15 ENCOUNTER — Telehealth: Payer: Self-pay | Admitting: Psychiatry

## 2022-01-15 NOTE — Telephone Encounter (Signed)
Tried # multiple times and get message that call cannot be completed at this time.

## 2022-01-15 NOTE — Telephone Encounter (Signed)
Last filled 11/29.

## 2022-01-15 NOTE — Telephone Encounter (Signed)
Called # listed for mother and that number was not correct. There is a letter sent from another provider in July because they were also unable to contact patient with number listed.

## 2022-01-15 NOTE — Telephone Encounter (Signed)
Patient lvm requesting a refill on the generic Vyvanse. She stated she is having problems/issues getting this filled. Patient stated a new Rx is needed per pharmacy.

## 2022-01-16 ENCOUNTER — Other Ambulatory Visit: Payer: Self-pay

## 2022-01-16 DIAGNOSIS — F902 Attention-deficit hyperactivity disorder, combined type: Secondary | ICD-10-CM

## 2022-01-16 MED ORDER — LISDEXAMFETAMINE DIMESYLATE 30 MG PO CAPS: 30.0000 mg | ORAL_CAPSULE | Freq: Every day | ORAL | 0 refills | Status: DC

## 2022-01-16 NOTE — Telephone Encounter (Signed)
Was able to contact patient and she said the pharmacy said they couldn't see the Rx due now. I told her I would call and see what the issue is and get new Rx sent if needed.

## 2022-01-16 NOTE — Telephone Encounter (Signed)
Pended.

## 2022-02-06 ENCOUNTER — Encounter: Payer: Self-pay | Admitting: Psychiatry

## 2022-02-06 ENCOUNTER — Ambulatory Visit: Payer: BC Managed Care – PPO | Admitting: Psychiatry

## 2022-02-06 DIAGNOSIS — F902 Attention-deficit hyperactivity disorder, combined type: Secondary | ICD-10-CM

## 2022-02-06 DIAGNOSIS — F99 Mental disorder, not otherwise specified: Secondary | ICD-10-CM | POA: Diagnosis not present

## 2022-02-06 DIAGNOSIS — F5105 Insomnia due to other mental disorder: Secondary | ICD-10-CM

## 2022-02-06 DIAGNOSIS — F4310 Post-traumatic stress disorder, unspecified: Secondary | ICD-10-CM

## 2022-02-06 MED ORDER — CLONAZEPAM 1 MG PO TABS: ORAL_TABLET | ORAL | 5 refills | Status: DC

## 2022-02-06 MED ORDER — LISDEXAMFETAMINE DIMESYLATE 30 MG PO CAPS: 30.0000 mg | ORAL_CAPSULE | Freq: Every day | ORAL | 0 refills | Status: DC

## 2022-02-06 MED ORDER — ZOLPIDEM TARTRATE 5 MG PO TABS: ORAL_TABLET | ORAL | 5 refills | Status: DC

## 2022-02-06 NOTE — Progress Notes (Signed)
Tonia Avino [id number] 1975-10-24 47 y.o.  Subjective:   Patient ID:  Laura Simmons is a 47 y.o. (DOB November 19, 1975) female.  Chief Complaint:  Chief Complaint  Patient presents with   Follow-up    ADHD, anxiety, depression, and insomnia    HPI Laura Simmons presents to the office today for follow-up of ADHD, Anxiety, depression, and insomnia. She reports feeling, "A lot better." She reports that she has been able to organize and de-clutter. "It feels good to have purged all that." She reports that others have noticed a cahnge in her focus. She reports that Vyvanse no longer seems to be as effective by 3:30-4 pm after taking it about 8-8:30 am. Mood has improved and has been feeling "less overwhelmed." She reports that she has been able to be more productive. She has been better able to implement strategies to help with projects- ie, making lists, and setting alarms. Notices she is putting things away as she goes and finishing projects. Denies any worsening anxiety. Denies any recent panic attacks. Sleeping ok. She reports that she will not be hungry is she takes Vyvanse on an empty stomach. She reports that she tries to eat regularly to avoid hypoglycemia. Denies SI.   Klonopin last filled 01/29/22 x 2 Vyvanse last filled 01/16/22 Ambien last filled 01/08/22 x 3  Past Psychiatric Medication Trials: Zyprexa- worsening depression Trileptal- benefits did not outweigh risks Remeron Zoloft Celexa Lexapro Trintellix- Severe n/v Wellbutrin Nortriptyline Ritalin Strattera-no improvement Qelbree- Denied by insurance. Tolerated samples for 1-2 weeks.  Seroquel- RLS Rexulti- Took briefly and may have been slightly helpful. Depakote Gabapentin Propranolol-fatigue Topamax Trazodone- excessive daytime somnolence Klonopin Xanax Ativan Ambien May have taken Lamictal   PHQ2-9    [facility] Visit from 03/25/2016 in [facility]   PHQ-2 Total Score 0        Review of Systems:  Review of Systems  Cardiovascular:  Negative for palpitations.  Musculoskeletal:  Negative for gait problem.  Neurological:  Negative for tremors.  Psychiatric/Behavioral:         Please refer to HPI    Medications: I have reviewed the patient's current medications.  Current Outpatient Medications  Medication Sig Dispense Refill   Dexlansoprazole 30 MG capsule DR Take 30 mg by mouth daily.     [START ON 04/10/2022] lisdexamfetamine (VYVANSE) 30 MG capsule Take 1 capsule (30 mg total) by mouth daily. 30 capsule 0   butalbital-acetaminophen-caffeine (FIORICET) 50-325-40 MG tablet Take 1-2 tabs every 6 hrs as needed for headaches. 20 tablet 0   calcium carbonate (TUMS - DOSED IN MG ELEMENTAL CALCIUM) 500 MG chewable tablet Chew 1 tablet by mouth as needed for indigestion or heartburn.     [START ON 03/26/2022] clonazePAM (KLONOPIN) 1 MG tablet TAKE 1/2 TO 1 TABLET BY MOUTH TWICE DAILY AS NEEDED FOR ANXIETY 60 tablet 5   cyclobenzaprine (FLEXERIL) 5 MG tablet Take 5 mg by mouth at bedtime. (Patient not taking: Reported on 11/06/2021)     dimenhyDRINATE (DRAMAMINE) 50 MG tablet Take 50 mg by mouth at bedtime as needed for nausea.     diphenhydrAMINE (BENADRYL) 25 MG tablet Take 25 mg by mouth every 6 (six) hours as needed.     ibuprofen (ADVIL) 100 MG tablet Take 100 mg by mouth every 6 (six) hours as needed for fever.     levocetirizine (XYZAL) 5 MG tablet Take 1 tablet (5 mg total) by mouth every evening. 30 tablet 5   [  START ON 02/13/2022] lisdexamfetamine (VYVANSE) 30 MG capsule Take 1 capsule (30 mg total) by mouth daily. 30 capsule 0   [START ON 03/13/2022] lisdexamfetamine (VYVANSE) 30 MG capsule Take 1 capsule (30 mg total) by mouth daily. 30 capsule 0   montelukast (SINGULAIR) 10 MG tablet Take 1 tablet (10 mg total) by mouth at bedtime. (Patient not taking: Reported on 11/06/2021) 30 tablet 3   Multiple Vitamin (MULTIVITAMIN WITH  MINERALS) TABS tablet Take 1 tablet by mouth daily. 30 tablet 1   pantoprazole (PROTONIX) 20 MG tablet Take 2 tablets (40 mg total) by mouth daily. (Patient not taking: Reported on 02/06/2022) 60 tablet 0   polyethylene glycol (MIRALAX / GLYCOLAX) 17 g packet Take 17 g by mouth daily as needed.     zolpidem (AMBIEN) 5 MG tablet TAKE 1 TABLET(5 MG) BY MOUTH AT BEDTIME AS NEEDED FOR SLEEP 30 tablet 5   No current facility-administered medications for this visit.    Medication Side Effects: Appetite Suppression  Allergies:  Allergies  Allergen Reactions   Prochlorperazine Other (See Comments)    lockjaw Other reaction(s): Other   Sumatriptan Succinate Anaphylaxis and Other (See Comments)    ALL TRIPTAN MEDS   Triptans Anaphylaxis and Other (See Comments)    Pt states throat feels as if it is closing.   Ketamine Other (See Comments)    "Pt. Reports she never wants again." major hallucinations   Reglan [Metoclopramide] Other (See Comments)    "crawling out of my skin"    Past Medical History:  Diagnosis Date   ADHD (attention deficit hyperactivity disorder)    Agoraphobia    Basal cell carcinoma    Bipolar 1 disorder ([place])    patient denies   Chronic pain    just migraines per patient   Endometriosis determined by laparoscopy    GERD (gastroesophageal reflux disease)    High platelet count    Migraine    Personality disorder ([place])    Right adrenal mass ([place])    per ct 09-06-2015   Right ovarian cyst    Vaginal Pap smear, abnormal     Past Medical History, Surgical history, Social history, and Family history were reviewed and updated as appropriate.   Please see review of systems for further details on the patient's review from today.   Objective:   Physical Exam:  BP 118/82   Pulse 83   Physical Exam Constitutional:      General: She is not in acute distress. Musculoskeletal:        General: No deformity.  Neurological:     Mental Status: She is alert and  oriented to person, place, and time.     Coordination: Coordination normal.  Psychiatric:        Attention and Perception: Attention and perception normal. She does not perceive auditory or visual hallucinations.        Mood and Affect: Mood normal. Mood is not anxious or depressed. Affect is not labile, blunt, angry or inappropriate.        Speech: Speech normal.        Behavior: Behavior normal.        Thought Content: Thought content normal. Thought content is not paranoid or delusional. Thought content does not include homicidal or suicidal ideation. Thought content does not include homicidal or suicidal plan.        Cognition and Memory: Cognition and memory normal.        Judgment: Judgment normal.  Comments: Insight intact     Lab Review:     Component Value Date/Time   NA 138 12/26/2019 1515   K 4.6 12/26/2019 1515   CL 98 12/26/2019 1515   CO2 30 12/26/2019 1515   GLUCOSE 75 12/26/2019 1515   BUN 12 12/26/2019 1515   CREATININE 0.87 12/26/2019 1515   CALCIUM 10.6 (H) 12/26/2019 1515   PROT 7.6 12/26/2019 1515   ALBUMIN 4.5 12/26/2019 1515   AST 13 12/26/2019 1515   ALT 8 12/26/2019 1515   ALKPHOS 60 12/26/2019 1515   BILITOT 0.3 12/26/2019 1515   GFRNONAA >60 08/05/2018 0616   GFRAA >60 08/05/2018 0616       Component Value Date/Time   WBC 6.5 08/06/2018 0533   RBC 4.05 08/06/2018 0533   HGB 13.0 08/06/2018 0533   HCT 37.6 08/06/2018 0533   PLT 268 08/06/2018 0533   MCV 92.8 08/06/2018 0533   MCH 32.1 08/06/2018 0533   MCHC 34.6 08/06/2018 0533   RDW 12.2 08/06/2018 0533   LYMPHSABS 2.7 08/06/2018 0533   MONOABS 0.5 08/06/2018 0533   EOSABS 0.2 08/06/2018 0533   BASOSABS 0.0 08/06/2018 0533    No results found for: "POCLITH", "LITHIUM"   No results found for: "PHENYTOIN", "PHENOBARB", "VALPROATE", "CBMZ"   .res Assessment: Plan:   Will continue current plan of care since target signs and symptoms are well controlled without any tolerability  issues. Continue Vyvanse 30 mg po q am for ADHD.  Continue Klonopin 1 mg 1/2-1 tab po BID prn anxiety.  Continue Ambien 5 mg po QHS prn insomnia.  Discussed that it may be helpful to see therapist regarding some situational stressors. Pt agrees and will make apt to see therapist. Pt to follow-up in 3 months or sooner if clinically indicated.  Patient advised to contact office with any questions, adverse effects, or acute worsening in signs and symptoms.   Meosha was seen today for follow-up.  Diagnoses and all orders for this visit:  PTSD (post-traumatic stress disorder) -     clonazePAM (KLONOPIN) 1 MG tablet; TAKE 1/2 TO 1 TABLET BY MOUTH TWICE DAILY AS NEEDED FOR ANXIETY  Insomnia due to other mental disorder -     zolpidem (AMBIEN) 5 MG tablet; TAKE 1 TABLET(5 MG) BY MOUTH AT BEDTIME AS NEEDED FOR SLEEP  Attention deficit hyperactivity disorder (ADHD), combined type -     lisdexamfetamine (VYVANSE) 30 MG capsule; Take 1 capsule (30 mg total) by mouth daily. -     lisdexamfetamine (VYVANSE) 30 MG capsule; Take 1 capsule (30 mg total) by mouth daily. -     lisdexamfetamine (VYVANSE) 30 MG capsule; Take 1 capsule (30 mg total) by mouth daily.     Please see After Visit Summary for patient specific instructions.  Future Appointments  Date Time Provider [place]  03/17/2022  4:00 PM Lina Sayre, [facility] CP-CP None  03/31/2022  9:00 AM Lina Sayre, [facility] CP-CP None  05/07/2022 10:30 AM Thayer Headings, PMHNP CP-CP None    No orders of the defined types were placed in this encounter.   -------------------------------

## 2022-02-16 ENCOUNTER — Telehealth: Payer: Self-pay | Admitting: Psychiatry

## 2022-02-16 NOTE — Telephone Encounter (Signed)
Her vyvanse needs a PA for a renewalll. She has gone 30 days without it.

## 2022-02-18 ENCOUNTER — Telehealth: Payer: Self-pay

## 2022-02-18 NOTE — Telephone Encounter (Signed)
PA was approved for generic 11/12/21-11/11/22. Will initiate PA for brand.

## 2022-02-18 NOTE — Telephone Encounter (Addendum)
Prior Authorization initiated Brand Vyvanse 30 mg #30 [employer]  PA not needed, was approved in October 23.

## 2022-02-19 ENCOUNTER — Other Ambulatory Visit: Payer: Self-pay

## 2022-02-19 DIAGNOSIS — F902 Attention-deficit hyperactivity disorder, combined type: Secondary | ICD-10-CM

## 2022-02-19 MED ORDER — LISDEXAMFETAMINE DIMESYLATE 30 MG PO CAPS: 30.0000 mg | ORAL_CAPSULE | Freq: Every day | ORAL | 0 refills | Status: DC

## 2022-02-19 NOTE — Telephone Encounter (Signed)
LVM to [facility]. PA for generic was approved. I called pharmacy and they said the issue is availability, not a PA.

## 2022-02-23 ENCOUNTER — Other Ambulatory Visit (HOSPITAL_COMMUNITY): Payer: Self-pay

## 2022-02-23 ENCOUNTER — Other Ambulatory Visit: Payer: Self-pay

## 2022-02-23 DIAGNOSIS — F902 Attention-deficit hyperactivity disorder, combined type: Secondary | ICD-10-CM

## 2022-02-23 MED ORDER — LISDEXAMFETAMINE DIMESYLATE 30 MG PO CAPS
30.0000 mg | ORAL_CAPSULE | Freq: Every day | ORAL | 0 refills | Status: DC
  Filled 2022-02-23: qty 30, 30d supply, fill #0

## 2022-02-24 ENCOUNTER — Other Ambulatory Visit (HOSPITAL_COMMUNITY): Payer: Self-pay

## 2022-03-17 ENCOUNTER — Ambulatory Visit: Payer: BC Managed Care – PPO | Admitting: Psychiatry

## 2022-03-17 DIAGNOSIS — F4310 Post-traumatic stress disorder, unspecified: Secondary | ICD-10-CM

## 2022-03-17 NOTE — Progress Notes (Signed)
[facility] Counselor/Therapist Progress Note  Patient ID: Laura Simmons, MRN: [id number],    Date: 03/17/2022  Time Spent: 50 minutes start time 4:07 PM end time 4:57 PM  Treatment Type: Individual Therapy  Reported Symptoms: anxiety, sadness, irritability, triggered responses, health issues, flashbacks  Mental Status Exam:  Appearance:   Casual     Behavior:  Appropriate  Motor:  Normal  Speech/Language:   Normal Rate  Affect:  Appropriate and Tearful  Mood:  anxious and sad  Thought process:  normal  Thought content:    WNL  Sensory/Perceptual disturbances:    WNL  Orientation:  oriented to person, place, time/date, and situation  Attention:  Good  Concentration:  Good  Memory:  WNL  Fund of knowledge:   Good  Insight:    Good  Judgment:   Good  Impulse Control:  Good   Risk Assessment: Danger to Self:  No Self-injurious Behavior: No Danger to Others: No Duty to Warn:no Physical Aggression / Violence:No  Access to Firearms a concern: No  Gang Involvement:No   Subjective: Patient was present for session. She shared that she is concerned about both of her parents. Her father is having pain in his legs and her mother has been sick lately. She shared that her mother has been taking things she says personally and it has not gone well.  She explained that she is not working but is trying to take care of the house and her parents pay her for that work. She shared that a friend found her boyfriend hanging in there basement and that was triggering. Her boyfriend's father also killed himself at the beginning of the summer and that was triggering.She also had another friend pass away.  Patient updated treatment plan and session.  She was able to recognize that she wants to get back to working and get out of the house more but with her car situation and her parents health that is very difficult.  Discussed the importance of recognizing her purpose and feeling good about the  fact that she is doing what she can and needs to for her parents by staying there with them and managing things in their house.  She was also encouraged to recognize she does need to get out more and start developing some relationships outside the house.  Discussed the possibility of her trying to find a part-time position rather than a full-time position due to that not being realistic with all the needs of her parents.  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  Interventions: Solution-Oriented/Positive Psychology and Insight-Oriented  Diagnosis:   ICD-10-CM   1. PTSD (post-traumatic stress disorder)  F43.10       Plan: Patient is to work on coping skills to decrease anxiety symptoms.  Patient is to focus on all that she is doing for her parents and to affirm herself regularly.  Patient is to exercise or try and clean to release negative emotions appropriately.  Patient is to take medication as directed. Long term goal: To recall the traumatic event without becoming overwhelmed and negative emotions Short-term goal: Practice implement relaxation training as a coping mechanism for tension panic stress anger and anxiety  Lina Sayre, [facility]

## 2022-03-24 ENCOUNTER — Telehealth: Payer: Self-pay | Admitting: Psychiatry

## 2022-03-24 NOTE — Telephone Encounter (Signed)
Pt called at 1:05p.  She is trying to figure out where to get the Vyvanse from for her latest script (she said it's due on 3/7).  There was some mix up between [facility] and [facility].  Pls call her back after 2pm today as she is at the dentist right now.

## 2022-03-24 NOTE — Telephone Encounter (Signed)
Patient has Rx at [facility], has not verified availability.

## 2022-03-25 ENCOUNTER — Other Ambulatory Visit (HOSPITAL_COMMUNITY): Payer: Self-pay

## 2022-03-31 ENCOUNTER — Ambulatory Visit: Payer: BC Managed Care – PPO | Admitting: Psychiatry

## 2022-04-16 ENCOUNTER — Ambulatory Visit: Payer: BC Managed Care – PPO | Admitting: Psychiatry

## 2022-04-16 DIAGNOSIS — F4310 Post-traumatic stress disorder, unspecified: Secondary | ICD-10-CM

## 2022-04-16 NOTE — Progress Notes (Signed)
      [facility] Counselor/Therapist Progress Note  Patient ID: Laura Simmons, MRN: [id number],    Date: 04/16/2022  Time Spent: 50 minutes start time 1:09 PM end time 1:59 PM  Treatment Type: Individual Therapy  Reported Symptoms: anxiety, intrusive thoughts, triggered responses, sadness, pain issues  Mental Status Exam:  Appearance:   Casual     Behavior:  Appropriate  Motor:  Normal  Speech/Language:   Normal Rate  Affect:  Appropriate tearful  Mood:  anxious and sad  Thought process:  normal  Thought content:    WNL  Sensory/Perceptual disturbances:    WNL  Orientation:  oriented to person, place, time/date, and situation  Attention:  Good  Concentration:  Good  Memory:  WNL  Fund of knowledge:   Good  Insight:    Good  Judgment:   Good  Impulse Control:  Good   Risk Assessment: Danger to Self:  No Self-injurious Behavior: No Danger to Others: No Duty to Warn:no Physical Aggression / Violence:No  Access to Firearms a concern: No  Gang Involvement:No   Subjective: Patient was present for session. She shared that she has been having intrusive thoughts  about finding her dad in the yard, but it has gotten better since he had his surgery. Her mother also feel off of a ladder and she got hurt.  Patient explained that she was having a lot of difficulty dealing with intrusive thoughts of something happening to her parents.  Patient explained that she is not sure where the intrusive thoughts are coming from but when she would rather be able to enjoy her time with her parents and have them.  Did processing set on the intrusive thoughts of losing that her mom, suds level 10, negative cognition "I cannot make it without them" felt confusion in her shoulders and back.  Patient was able to reduce suds level to 5. she was able to feel calmer at the end of session discussed ways that she could utilize technique from session to help her when she gets very agitated or anxious at home.     Interventions: Solution focused/positive psychology, insight oriented, and brain spotting  Diagnosis:   ICD-10-CM   1. PTSD (post-traumatic stress disorder)  F43.10       Plan:  Patient is to work on coping skills to decrease anxiety symptoms.  Patient is to practice brain spotting technique discussed in session.  Patient is to focus on all that she is doing for her parents and to affirm herself regularly.  Patient is to exercise or try and clean to release negative emotions appropriately.  Patient is to take medication as directed. Long term goal: To recall the traumatic event without becoming overwhelmed and negative emotions Short-term goal: Practice implement relaxation training as a coping mechanism for tension panic stress anger and anxiety    Lina Sayre, [facility]

## 2022-05-07 ENCOUNTER — Encounter: Payer: Self-pay | Admitting: Psychiatry

## 2022-05-07 ENCOUNTER — Ambulatory Visit (INDEPENDENT_AMBULATORY_CARE_PROVIDER_SITE_OTHER): Payer: BC Managed Care – PPO | Admitting: Psychiatry

## 2022-05-07 VITALS — BP 135/85 | HR 69 | Wt 120.0 lb

## 2022-05-07 DIAGNOSIS — F902 Attention-deficit hyperactivity disorder, combined type: Secondary | ICD-10-CM | POA: Diagnosis not present

## 2022-05-07 DIAGNOSIS — F4310 Post-traumatic stress disorder, unspecified: Secondary | ICD-10-CM

## 2022-05-07 DIAGNOSIS — F3342 Major depressive disorder, recurrent, in full remission: Secondary | ICD-10-CM

## 2022-05-07 DIAGNOSIS — F5105 Insomnia due to other mental disorder: Secondary | ICD-10-CM | POA: Diagnosis not present

## 2022-05-07 DIAGNOSIS — F99 Mental disorder, not otherwise specified: Secondary | ICD-10-CM

## 2022-05-07 MED ORDER — LISDEXAMFETAMINE DIMESYLATE 30 MG PO CAPS: 30.0000 mg | ORAL_CAPSULE | Freq: Every day | ORAL | 0 refills | Status: DC

## 2022-05-07 NOTE — Progress Notes (Signed)
Laura Simmons [id number] 06/10/1975 47 y.o.  Subjective:   Patient ID:  Laura Simmons is a 47 y.o. (DOB 05-21-1975) female.  Chief Complaint:  Chief Complaint  Patient presents with   Follow-up    ADHD, anxiety, depression, and insomnia    HPI Laura Simmons presents to the office today for follow-up of ADHD, anxiety, depression, and insomnia. She has been de-cluterring and organizing. "I'm being a lot more productive and not getting overwhelmed by stuff." She reports that she is now able to take on one task at a time. She is now helping parents de-clutter and clean out cabinets. "It's made me feel a lot better to be productive." Denies recent depressed mood. She reports improved PTSD s/s. She was recently able to help someone going through a similar trauma. She reports that anxiety has "been ok." She reports that she usually is taking Klonopin prn once daily instead of twice, like she has in the past. Sleep has been ok. Energy and motivation have improved. Appetite has been ok. Denies SI.   Recently took a trip to [place] with friends and saw solar eclipse.   Father had back surgery and she reports that this went well.   Klonopin last filled 05/03/22 x 2. Vyvanse last filled 04/23/22. Ambien last filled 04/07/22 x2.   Past Psychiatric Medication Trials: Zyprexa- worsening depression Trileptal- benefits did not outweigh risks Remeron Zoloft Celexa Lexapro Trintellix- Severe n/v Wellbutrin Nortriptyline Ritalin Strattera-no improvement Qelbree- Denied by insurance. Tolerated samples for 1-2 weeks.  Seroquel- RLS Rexulti- Took briefly and may have been slightly helpful. Depakote Gabapentin Propranolol-fatigue Topamax Trazodone- excessive daytime somnolence Klonopin Xanax Ativan Ambien May have taken Lamictal  PHQ2-9    Flowsheet Row Office Visit from 03/25/2016 in [facility]  PHQ-2 Total Score 0        Review of  Systems:  Review of Systems  HENT:  Positive for congestion.   Cardiovascular:  Negative for palpitations.  Genitourinary:        She continues to have heavy, painful periods. She plans to have a hysterectomy once father has recovered from his back surgery.   Musculoskeletal:  Negative for gait problem.  Allergic/Immunologic: Positive for environmental allergies.  Neurological:  Negative for tremors.  Psychiatric/Behavioral:         Please refer to HPI    Medications: I have reviewed the patient's current medications.  Current Outpatient Medications  Medication Sig Dispense Refill   calcium carbonate (TUMS - DOSED IN MG ELEMENTAL CALCIUM) 500 MG chewable tablet Chew 1 tablet by mouth as needed for indigestion or heartburn.     clonazePAM (KLONOPIN) 1 MG tablet TAKE 1/2 TO 1 TABLET BY MOUTH TWICE DAILY AS NEEDED FOR ANXIETY 60 tablet 5   dimenhyDRINATE (DRAMAMINE) 50 MG tablet Take 50 mg by mouth at bedtime as needed for nausea.     diphenhydrAMINE (BENADRYL) 25 MG tablet Take 25 mg by mouth every 6 (six) hours as needed.     ibuprofen (ADVIL) 100 MG tablet Take 100 mg by mouth every 6 (six) hours as needed for fever.     levocetirizine (XYZAL) 5 MG tablet Take 1 tablet (5 mg total) by mouth every evening. 30 tablet 5   Multiple Vitamin (MULTIVITAMIN WITH MINERALS) TABS tablet Take 1 tablet by mouth daily. 30 tablet 1   polyethylene glycol (MIRALAX / GLYCOLAX) 17 g packet Take 17 g by mouth daily as needed.     zolpidem (AMBIEN)  5 MG tablet TAKE 1 TABLET(5 MG) BY MOUTH AT BEDTIME AS NEEDED FOR SLEEP 30 tablet 5   butalbital-acetaminophen-caffeine (FIORICET) 50-325-40 MG tablet Take 1-2 tabs every 6 hrs as needed for headaches. (Patient not taking: Reported on 05/07/2022) 20 tablet 0   cyclobenzaprine (FLEXERIL) 5 MG tablet Take 5 mg by mouth at bedtime. (Patient not taking: Reported on 11/06/2021)     Dexlansoprazole 30 MG capsule DR Take 30 mg by mouth daily. (Patient not taking: Reported  on 05/07/2022)     lisdexamfetamine (VYVANSE) 30 MG capsule Take 1 capsule (30 mg total) by mouth daily. 30 capsule 0   [START ON 05/21/2022] lisdexamfetamine (VYVANSE) 30 MG capsule Take 1 capsule (30 mg total) by mouth daily. 30 capsule 0   [START ON 06/18/2022] lisdexamfetamine (VYVANSE) 30 MG capsule Take 1 capsule (30 mg total) by mouth daily. 30 capsule 0   [START ON 07/16/2022] lisdexamfetamine (VYVANSE) 30 MG capsule Take 1 capsule (30 mg total) by mouth daily. 30 capsule 0   montelukast (SINGULAIR) 10 MG tablet Take 1 tablet (10 mg total) by mouth at bedtime. (Patient not taking: Reported on 11/06/2021) 30 tablet 3   pantoprazole (PROTONIX) 20 MG tablet Take 2 tablets (40 mg total) by mouth daily. (Patient not taking: Reported on 02/06/2022) 60 tablet 0   No current facility-administered medications for this visit.    Medication Side Effects: None  Allergies:  Allergies  Allergen Reactions   Prochlorperazine Other (See Comments)    lockjaw Other reaction(s): Other   Sumatriptan Succinate Anaphylaxis and Other (See Comments)    ALL TRIPTAN MEDS   Triptans Anaphylaxis and Other (See Comments)    Pt states throat feels as if it is closing.   Ketamine Other (See Comments)    "Pt. Reports she never wants again." major hallucinations   Reglan [Metoclopramide] Other (See Comments)    "crawling out of my skin"    Past Medical History:  Diagnosis Date   ADHD (attention deficit hyperactivity disorder)    Agoraphobia    Basal cell carcinoma    Bipolar 1 disorder    patient denies   Chronic pain    just migraines per patient   Endometriosis determined by laparoscopy    GERD (gastroesophageal reflux disease)    High platelet count    Migraine    Personality disorder    Right adrenal mass    per ct 09-06-2015   Right ovarian cyst    Vaginal Pap smear, abnormal     Past Medical History, Surgical history, Social history, and Family history were reviewed and updated as appropriate.    Please see review of systems for further details on the patient's review from today.   Objective:   Physical Exam:  BP 135/85   Pulse 69   Wt 120 lb (54.4 kg)   BMI 19.97 kg/m   Physical Exam Constitutional:      General: She is not in acute distress. Musculoskeletal:        General: No deformity.  Neurological:     Mental Status: She is alert and oriented to person, place, and time.     Coordination: Coordination normal.  Psychiatric:        Attention and Perception: Attention and perception normal. She does not perceive auditory or visual hallucinations.        Mood and Affect: Mood normal. Mood is not anxious or depressed. Affect is not labile, blunt, angry or inappropriate.  Speech: Speech normal.        Behavior: Behavior normal.        Thought Content: Thought content normal. Thought content is not paranoid or delusional. Thought content does not include homicidal or suicidal ideation. Thought content does not include homicidal or suicidal plan.        Cognition and Memory: Cognition and memory normal.        Judgment: Judgment normal.     Comments: Insight intact     Lab Review:     Component Value Date/Time   NA 138 12/26/2019 1515   K 4.6 12/26/2019 1515   CL 98 12/26/2019 1515   CO2 30 12/26/2019 1515   GLUCOSE 75 12/26/2019 1515   BUN 12 12/26/2019 1515   CREATININE 0.87 12/26/2019 1515   CALCIUM 10.6 (H) 12/26/2019 1515   PROT 7.6 12/26/2019 1515   ALBUMIN 4.5 12/26/2019 1515   AST 13 12/26/2019 1515   ALT 8 12/26/2019 1515   ALKPHOS 60 12/26/2019 1515   BILITOT 0.3 12/26/2019 1515   GFRNONAA >60 08/05/2018 0616   GFRAA >60 08/05/2018 0616       Component Value Date/Time   WBC 6.5 08/06/2018 0533   RBC 4.05 08/06/2018 0533   HGB 13.0 08/06/2018 0533   HCT 37.6 08/06/2018 0533   PLT 268 08/06/2018 0533   MCV 92.8 08/06/2018 0533   MCH 32.1 08/06/2018 0533   MCHC 34.6 08/06/2018 0533   RDW 12.2 08/06/2018 0533   LYMPHSABS 2.7  08/06/2018 0533   MONOABS 0.5 08/06/2018 0533   EOSABS 0.2 08/06/2018 0533   BASOSABS 0.0 08/06/2018 0533    No results found for: "POCLITH", "LITHIUM"   No results found for: "PHENYTOIN", "PHENOBARB", "VALPROATE", "CBMZ"   .res Assessment: Plan:   Will continue current plan of care since target signs and symptoms are well controlled without any tolerability issues. Continue Vyvanse 30 mg daily for attention deficit disorder. Continue Ambien 5 mg at bedtime as needed for insomnia. Continue Klonopin 1 mg 1/2-1 tab twice daily as needed for anxiety. Pt to follow-up in 3 months or sooner if clinically indicated.  Will continue current plan of care since target signs and symptoms are well controlled without any tolerability issues. Recommend continuing therapy with Stevphen Meuse, [facility].    Delvina was seen today for follow-up.  Diagnoses and all orders for this visit:  Attention deficit hyperactivity disorder (ADHD), combined type -     lisdexamfetamine (VYVANSE) 30 MG capsule; Take 1 capsule (30 mg total) by mouth daily. -     lisdexamfetamine (VYVANSE) 30 MG capsule; Take 1 capsule (30 mg total) by mouth daily. -     lisdexamfetamine (VYVANSE) 30 MG capsule; Take 1 capsule (30 mg total) by mouth daily.  PTSD (post-traumatic stress disorder)  Insomnia due to other mental disorder  Recurrent major depressive disorder, in full remission     Please see After Visit Summary for patient specific instructions.  Future Appointments  Date Time Provider Department Center  05/11/2022  2:00 PM Stevphen Meuse, [facility] None  06/09/2022  2:00 PM Stevphen Meuse, [facility] None  07/27/2022 11:00 AM Corie Chiquito, [facility] CP[facility] None    No orders of the defined types were placed in this encounter.   -------------------------------

## 2022-05-08 ENCOUNTER — Other Ambulatory Visit: Payer: Self-pay

## 2022-05-08 ENCOUNTER — Other Ambulatory Visit: Payer: Self-pay | Admitting: Family Medicine

## 2022-05-08 MED ORDER — FLUTICASONE PROPIONATE 50 MCG/ACT NA SUSP: 2.0000 | Freq: Every day | NASAL | 0 refills | Status: DC

## 2022-05-11 ENCOUNTER — Telehealth: Payer: Self-pay | Admitting: Psychiatry

## 2022-05-11 ENCOUNTER — Ambulatory Visit (INDEPENDENT_AMBULATORY_CARE_PROVIDER_SITE_OTHER): Payer: BC Managed Care – PPO | Admitting: Psychiatry

## 2022-05-11 DIAGNOSIS — F4310 Post-traumatic stress disorder, unspecified: Secondary | ICD-10-CM

## 2022-05-11 NOTE — Progress Notes (Signed)
[facility] Counselor/Therapist Progress Note  Patient ID: Laura Simmons, MRN: [id number],    Date: 05/11/2022  Time Spent: 50 minutes 1:58 PM end time 2:50 PM Virtual Visit via Video Note Connected with patient by a telemedicine/telehealth application, with their informed consent, and verified patient privacy and that I am speaking with the correct person using two identifiers. I discussed the limitations, risks, security and privacy concerns of performing psychotherapy and the availability of in person appointments. I also discussed with the patient that there may be a patient responsible charge related to this service. The patient expressed understanding and agreed to proceed. I discussed the treatment planning with the patient. The patient was provided an opportunity to ask questions and all were answered. The patient agreed with the plan and demonstrated an understanding of the instructions. The patient was advised to call  our office if  symptoms worsen or feel they are in a crisis state and need immediate contact.   Therapist Location: Home Patient Location: home     Treatment Type: Individual Therapy  Reported Symptoms: anxiety, sadness, flashbacks, triggered respnses  Mental Status Exam:  Appearance:   Casual     Behavior:  Appropriate  Motor:  Normal  Speech/Language:   Normal Rate  Affect:  Appropriate  Mood:  anxious  Thought process:  normal  Thought content:    WNL  Sensory/Perceptual disturbances:    WNL  Orientation:  oriented to person, place, time/date, and situation  Attention:  Good  Concentration:  Good  Memory:  WNL  Fund of knowledge:   Good  Insight:    Good  Judgment:   Good  Impulse Control:  Good   Risk Assessment: Danger to Self:  No Self-injurious Behavior: No Danger to Others: No Duty to Warn:no Physical Aggression / Violence:No  Access to Firearms a concern: No  Gang Involvement:No   Subjective: Met with patient via virtual  session. She reported that her trip was wonderful. She shared it was good  for her to get away and talking with the friend who's boyfriend committed suicide helped her see how far she has come in treatment. She forgot Drew's Birthday while she was on her trip which she never thought that would happen.She was also able to see that her emotions are better.  Patient reported the girl is father along in her healing. Reminded her that she went through multiple traumas at the same time so she can't compare herself to someone else.  She was able to remember how much happened and that was why moving on was so hard  for her.  Patient has also been able to follow through on some plans and is working on the house.  She stated it has given her a purpose and seeing her mother feeling good about it has helped her.  Patient agreed to continue moving forward with that work.  She also shared she is finally ready to get a puppy.  Encouraged patient to recognize her progress and continue doing all the positive things that have helped her.  Interventions: Solution-Oriented/Positive Psychology  Diagnosis:   ICD-10-CM   1. PTSD (post-traumatic stress disorder)  F43.10       Plan:  Patient is to work on coping skills to decrease anxiety symptoms.  Patient is to practice brain spotting technique discussed in session.  Patient is to focus on all that she is doing for her parents and to affirm herself regularly.  Patient is to exercise or try  and clean to release negative emotions appropriately.  Patient is to take medication as directed. Long term goal: To recall the traumatic event without becoming overwhelmed and negative emotions Short-term goal: Practice implement relaxation training as a coping mechanism for tension panic stress anger and anxiety  Stevphen Meuse, [facility]

## 2022-05-11 NOTE — Telephone Encounter (Signed)
Laura Simmons, Laura Simmons are scheduled for a virtual visit with your provider today.    Just as we do with appointments in the office, we must obtain your consent to participate.  Your consent will be active for this visit and any virtual visit you may have with one of our providers in the next 365 days.    If you have a MyChart account, I can also send a copy of this consent to you electronically.  All virtual visits are billed to your insurance company just like a traditional visit in the office.  As this is a virtual visit, video technology does not allow for your provider to perform a traditional examination.  This may limit your provider's ability to fully assess your condition.  If your provider identifies any concerns that need to be evaluated in person or the need to arrange testing such as labs, EKG, etc, we will make arrangements to do so.    Although advances in technology are sophisticated, we cannot ensure that it will always work on either your end or our end.  If the connection with a video visit is poor, we may have to switch to a telephone visit.  With either a video or telephone visit, we are not always able to ensure that we have a secure connection.   I need to obtain your verbal consent now.   Are you willing to proceed with your visit today?   Laura Simmons has provided verbal consent on 05/11/2022 for a virtual visit (video or telephone).   Stevphen Meuse, [facility] 05/11/2022  2:03 PM

## 2022-06-09 ENCOUNTER — Ambulatory Visit (INDEPENDENT_AMBULATORY_CARE_PROVIDER_SITE_OTHER): Payer: BC Managed Care – PPO | Admitting: Psychiatry

## 2022-06-09 ENCOUNTER — Encounter: Payer: Self-pay | Admitting: Psychiatry

## 2022-06-09 DIAGNOSIS — F4310 Post-traumatic stress disorder, unspecified: Secondary | ICD-10-CM | POA: Diagnosis not present

## 2022-06-09 NOTE — Progress Notes (Unsigned)
      [facility] Counselor/Therapist Progress Note  Patient ID: Laura Simmons, MRN: [id number],    Date: 06/09/2022  Time Spent: 50 minutes  start time 2:01 PM end time 2:51 PM Virtual Visit via Video Note Connected with patient by a telemedicine/telehealth application, with their informed consent, and verified patient privacy and that I am speaking with the correct person using two identifiers. I discussed the limitations, risks, security and privacy concerns of performing psychotherapy and the availability of in person appointments. I also discussed with the patient that there may be a patient responsible charge related to this service. The patient expressed understanding and agreed to proceed. I discussed the treatment planning with the patient. The patient was provided an opportunity to ask questions and all were answered. The patient agreed with the plan and demonstrated an understanding of the instructions. The patient was advised to call  our office if  symptoms worsen or feel they are in a crisis state and need immediate contact.   Therapist Location: office Patient Location: home    Treatment Type: Individual Therapy  Reported Symptoms: anxiety, triggered responses, sadness  Mental Status Exam:  Appearance:   Casual     Behavior:  Appropriate  Motor:  Normal  Speech/Language:   Normal Rate  Affect:  Appropriate  Mood:  anxious  Thought process:  normal  Thought content:    WNL  Sensory/Perceptual disturbances:    WNL  Orientation:  oriented to person, place, time/date, and situation  Attention:  Good  Concentration:  Good  Memory:  WNL  Fund of knowledge:   Good  Insight:    Good  Judgment:   Good  Impulse Control:  Good   Risk Assessment: Danger to Self:  No Self-injurious Behavior: No Danger to Others: No Duty to Warn:no Physical Aggression / Violence:No  Access to Firearms a concern: No  Gang Involvement:No   Subjective: Met with patient via virtual  session. She shared that she was struggling due to her dad being in a nursing home for a few weeks due to physical issues. She shared he is weak and fragile and it has been overwhelming for her.  She went on share that she has taken over doing the yard. She stated that she is seeing more why she is with them and that is helping her. She went on to share she is realizing she has to take better care of herself and feel good again. She shared she was able to set a limit with someone which is progress for her.   Interventions: Cognitive Behavioral Therapy and Solution-Oriented/Positive Psychology  Diagnosis:   ICD-10-CM   1. PTSD (post-traumatic stress disorder)  F43.10       Plan:  Patient is to work on coping skills to decrease anxiety symptoms.  Patient is to practice brain spotting technique discussed in session.  Patient is to focus on all that she is doing for her parents and to affirm herself regularly.  Patient is to exercise or try and clean to release negative emotions appropriately.  Patient is to take medication as directed. Long term goal: To recall the traumatic event without becoming overwhelmed and negative emotions Short-term goal: Practice implement relaxation training as a coping mechanism for tension panic stress anger and anxiety  Stevphen Meuse, [facility]

## 2022-07-27 ENCOUNTER — Ambulatory Visit (INDEPENDENT_AMBULATORY_CARE_PROVIDER_SITE_OTHER): Payer: BC Managed Care – PPO | Admitting: Psychiatry

## 2022-07-27 ENCOUNTER — Encounter: Payer: Self-pay | Admitting: Psychiatry

## 2022-07-27 DIAGNOSIS — F4310 Post-traumatic stress disorder, unspecified: Secondary | ICD-10-CM

## 2022-07-27 DIAGNOSIS — F902 Attention-deficit hyperactivity disorder, combined type: Secondary | ICD-10-CM | POA: Diagnosis not present

## 2022-07-27 DIAGNOSIS — F5105 Insomnia due to other mental disorder: Secondary | ICD-10-CM | POA: Diagnosis not present

## 2022-07-27 DIAGNOSIS — F99 Mental disorder, not otherwise specified: Secondary | ICD-10-CM | POA: Diagnosis not present

## 2022-07-27 MED ORDER — ZOLPIDEM TARTRATE 5 MG PO TABS: ORAL_TABLET | ORAL | 5 refills | Status: DC

## 2022-07-27 MED ORDER — LISDEXAMFETAMINE DIMESYLATE 30 MG PO CAPS
30.0000 mg | ORAL_CAPSULE | Freq: Every day | ORAL | 0 refills | Status: DC
Start: 2022-09-14 — End: 2022-11-09

## 2022-07-27 MED ORDER — LISDEXAMFETAMINE DIMESYLATE 30 MG PO CAPS
30.0000 mg | ORAL_CAPSULE | Freq: Every day | ORAL | 0 refills | Status: DC
Start: 2022-08-17 — End: 2022-11-09

## 2022-07-27 MED ORDER — CLONAZEPAM 1 MG PO TABS: ORAL_TABLET | ORAL | 5 refills | Status: DC

## 2022-07-27 MED ORDER — LISDEXAMFETAMINE DIMESYLATE 30 MG PO CAPS
30.0000 mg | ORAL_CAPSULE | Freq: Every day | ORAL | 0 refills | Status: DC
Start: 2022-10-12 — End: 2023-02-17

## 2022-07-27 NOTE — Progress Notes (Signed)
Laura Simmons [id number] [birth year] 47 y.o.  Subjective:   Patient ID:  Laura Simmons is a 47 y.o. (DOB November 11, 1975) female.  Chief Complaint:  Chief Complaint  Patient presents with   Follow-up    Depression, anxiety, ADHD, and insomnia    HPI Laura Simmons presents to the office today for follow-up of ADHD, anxiety, depression, and insomnia. Her father let her know he is no longer able to do yard work and she is now doing all the mowing, trimming, weeding, etc. She reports, "this makes me feel good" in terms of being able to help aging parents and staying busy. She is also doing family's laundry. She reports that keeping her "mind occupied" is helpful for mood and anxiety. Denies persistent depressed mood. She reports some irritability around her menstrual cycle and has noticed this more recently. Energy and motivation have improved. Sleeping well. Appetite has been good. She reports that she eats less when she is working in the heat. Concentration is improving. She reports that she has to perform one task at a time. Denies SI.   She reports, "I'm doing a lot better." She is planning her 30th class reunion- "I'm stepping out of my comfort-zone for sure." She reports that her mother is anxious about this and this causes her increased anxiety. Denies panic attacks.   Father will be 28 yo this month.   She reports that she taking Klonopin prn about once every evening.   Vyvanse last filled 07/20/22. Klonopin last filled last filled 07/13/22. Ambien last filled 07/04/22.   Past Psychiatric Medication Trials: Zyprexa- worsening depression Trileptal- benefits did not outweigh risks Remeron Zoloft Celexa Lexapro Trintellix- Severe n/v Wellbutrin Nortriptyline Ritalin Strattera-no improvement Qelbree- Denied by insurance. Tolerated samples for 1-2 weeks.  Seroquel- RLS Rexulti- Took briefly and may have been slightly  helpful. Depakote Gabapentin Propranolol-fatigue Topamax Trazodone- excessive daytime somnolence Klonopin Xanax Ativan Ambien May have taken Lamictal  PHQ2-9    Flowsheet Row Office Visit from 03/25/2016 in [facility]  PHQ-2 Total Score 0        Review of Systems:  Review of Systems  Genitourinary:  Positive for menstrual problem.  Musculoskeletal:  Negative for gait problem.  Psychiatric/Behavioral:         Please refer to HPI    Medications: I have reviewed the patient's current medications.  Current Outpatient Medications  Medication Sig Dispense Refill   calcium carbonate (TUMS - DOSED IN MG ELEMENTAL CALCIUM) 500 MG chewable tablet Chew 1 tablet by mouth as needed for indigestion or heartburn.     dimenhyDRINATE (DRAMAMINE) 50 MG tablet Take 50 mg by mouth at bedtime as needed for nausea.     diphenhydrAMINE (BENADRYL) 25 MG tablet Take 25 mg by mouth every 6 (six) hours as needed.     fluticasone (FLONASE) 50 MCG/ACT nasal spray Place 2 sprays into both nostrils daily. 16 g 0   ibuprofen (ADVIL) 100 MG tablet Take 100 mg by mouth every 6 (six) hours as needed for fever.     Multiple Vitamin (MULTIVITAMIN WITH MINERALS) TABS tablet Take 1 tablet by mouth daily. 30 tablet 1   polyethylene glycol (MIRALAX / GLYCOLAX) 17 g packet Take 17 g by mouth daily as needed.     butalbital-acetaminophen-caffeine (FIORICET) 50-325-40 MG tablet Take 1-2 tabs every 6 hrs as needed for headaches. (Patient not taking: Reported on 05/07/2022) 20 tablet 0   [START ON 08/10/2022] clonazePAM (KLONOPIN) 1 MG tablet TAKE  1/2 TO 1 TABLET BY MOUTH TWICE DAILY AS NEEDED FOR ANXIETY 60 tablet 5   levocetirizine (XYZAL) 5 MG tablet Take 1 tablet (5 mg total) by mouth every evening. (Patient not taking: Reported on 07/27/2022) 30 tablet 5   lisdexamfetamine (VYVANSE) 30 MG capsule Take 1 capsule (30 mg total) by mouth daily. 30 capsule 0   [START ON  08/17/2022] lisdexamfetamine (VYVANSE) 30 MG capsule Take 1 capsule (30 mg total) by mouth daily. 30 capsule 0   [START ON 09/14/2022] lisdexamfetamine (VYVANSE) 30 MG capsule Take 1 capsule (30 mg total) by mouth daily. 30 capsule 0   [START ON 10/12/2022] lisdexamfetamine (VYVANSE) 30 MG capsule Take 1 capsule (30 mg total) by mouth daily. 30 capsule 0   [START ON 08/01/2022] zolpidem (AMBIEN) 5 MG tablet TAKE 1 TABLET(5 MG) BY MOUTH AT BEDTIME AS NEEDED FOR SLEEP 30 tablet 5   No current facility-administered medications for this visit.    Medication Side Effects: None  Allergies:  Allergies  Allergen Reactions   Prochlorperazine Other (See Comments)    lockjaw Other reaction(s): Other   Sumatriptan Succinate Anaphylaxis and Other (See Comments)    ALL TRIPTAN MEDS   Triptans Anaphylaxis and Other (See Comments)    Pt states throat feels as if it is closing.   Ketamine Other (See Comments)    "Pt. Reports she never wants again." major hallucinations   Reglan [Metoclopramide] Other (See Comments)    "crawling out of my skin"    Past Medical History:  Diagnosis Date   ADHD (attention deficit hyperactivity disorder)    Agoraphobia    Basal cell carcinoma    Bipolar 1 disorder (HCC)    patient denies   Chronic pain    just migraines per patient   Endometriosis determined by laparoscopy    GERD (gastroesophageal reflux disease)    High platelet count    Migraine    Personality disorder (HCC)    Right adrenal mass (HCC)    per ct 09-06-2015   Right ovarian cyst    Vaginal Pap smear, abnormal     Past Medical History, Surgical history, Social history, and Family history were reviewed and updated as appropriate.   Please see review of systems for further details on the patient's review from today.   Objective:   Physical Exam:  BP 120/76   Pulse 63   Physical Exam Constitutional:      General: She is not in acute distress. Musculoskeletal:        General: No  deformity.  Neurological:     Mental Status: She is alert and oriented to person, place, and time.     Coordination: Coordination normal.  Psychiatric:        Attention and Perception: Attention and perception normal. She does not perceive auditory or visual hallucinations.        Mood and Affect: Mood normal. Mood is not anxious or depressed. Affect is not labile, blunt, angry or inappropriate.        Speech: Speech normal.        Behavior: Behavior normal.        Thought Content: Thought content normal. Thought content is not paranoid or delusional. Thought content does not include homicidal or suicidal ideation. Thought content does not include homicidal or suicidal plan.        Cognition and Memory: Cognition and memory normal.        Judgment: Judgment normal.     Comments: Insight intact  Lab Review:     Component Value Date/Time   NA 138 12/26/2019 1515   K 4.6 12/26/2019 1515   CL 98 12/26/2019 1515   CO2 30 12/26/2019 1515   GLUCOSE 75 12/26/2019 1515   BUN 12 12/26/2019 1515   CREATININE 0.87 12/26/2019 1515   CALCIUM 10.6 (H) 12/26/2019 1515   PROT 7.6 12/26/2019 1515   ALBUMIN 4.5 12/26/2019 1515   AST 13 12/26/2019 1515   ALT 8 12/26/2019 1515   ALKPHOS 60 12/26/2019 1515   BILITOT 0.3 12/26/2019 1515   GFRNONAA >60 08/05/2018 0616   GFRAA >60 08/05/2018 0616       Component Value Date/Time   WBC 6.5 08/06/2018 0533   RBC 4.05 08/06/2018 0533   HGB 13.0 08/06/2018 0533   HCT 37.6 08/06/2018 0533   PLT 268 08/06/2018 0533   MCV 92.8 08/06/2018 0533   MCH 32.1 08/06/2018 0533   MCHC 34.6 08/06/2018 0533   RDW 12.2 08/06/2018 0533   LYMPHSABS 2.7 08/06/2018 0533   MONOABS 0.5 08/06/2018 0533   EOSABS 0.2 08/06/2018 0533   BASOSABS 0.0 08/06/2018 0533    No results found for: "POCLITH", "LITHIUM"   No results found for: "PHENYTOIN", "PHENOBARB", "VALPROATE", "CBMZ"   .res Assessment: Plan:    Will continue current plan of care since pt reports  that symptoms are well controlled and she is tolerating medications without difficulty.  Continue Vyvanse 30 mg daily for ADHD.  Continue Ambien 5 mg po at bedtime prn insomnia.  Continue Klonopin 1 mg 1/2-1 tab po BID prn anxiety.  Recommend continuing therapy with Stevphen Meuse, [facility].  Pt to follow-up in 3 months or sooner if clinically indicated.  Patient advised to contact office with any questions, adverse effects, or acute worsening in signs and symptoms.   Laura Simmons was seen today for follow-up.  Diagnoses and all orders for this visit:  PTSD (post-traumatic stress disorder) -     clonazePAM (KLONOPIN) 1 MG tablet; TAKE 1/2 TO 1 TABLET BY MOUTH TWICE DAILY AS NEEDED FOR ANXIETY  Insomnia due to other mental disorder -     zolpidem (AMBIEN) 5 MG tablet; TAKE 1 TABLET(5 MG) BY MOUTH AT BEDTIME AS NEEDED FOR SLEEP  Attention deficit hyperactivity disorder (ADHD), combined type -     lisdexamfetamine (VYVANSE) 30 MG capsule; Take 1 capsule (30 mg total) by mouth daily. -     lisdexamfetamine (VYVANSE) 30 MG capsule; Take 1 capsule (30 mg total) by mouth daily. -     lisdexamfetamine (VYVANSE) 30 MG capsule; Take 1 capsule (30 mg total) by mouth daily.     Please see After Visit Summary for patient specific instructions.  Future Appointments  Date Time Provider Department Center  08/04/2022 10:00 AM Stevphen Meuse, [facility] CP-CP None  10/26/2022 11:00 AM Corie Chiquito, [facility]NP CP-CP None     No orders of the defined types were placed in this encounter.   -------------------------------

## 2022-08-04 ENCOUNTER — Ambulatory Visit (INDEPENDENT_AMBULATORY_CARE_PROVIDER_SITE_OTHER): Payer: BC Managed Care – PPO | Admitting: Psychiatry

## 2022-08-04 DIAGNOSIS — F4310 Post-traumatic stress disorder, unspecified: Secondary | ICD-10-CM | POA: Diagnosis not present

## 2022-08-04 NOTE — Progress Notes (Signed)
[facility] Counselor/Therapist Progress Note  Patient ID: Laura Simmons, MRN: [id number],    Date: 08/04/2022  Time Spent: 52 minutes start time 9:57 AM end time 10:49 AM Virtual Visit via Video Note Connected with patient by a telemedicine/telehealth application, with their informed consent, and verified patient privacy and that I am speaking with the correct person using two identifiers. I discussed the limitations, risks, security and privacy concerns of performing psychotherapy and the availability of in person appointments. I also discussed with the patient that there may be a patient responsible charge related to this service. The patient expressed understanding and agreed to proceed. I discussed the treatment planning with the patient. The patient was provided an opportunity to ask questions and all were answered. The patient agreed with the plan and demonstrated an understanding of the instructions. The patient was advised to call  our office if  symptoms worsen or feel they are in a crisis state and need immediate contact.   Therapist Location: home Patient Location: home    Treatment Type: Individual Therapy  Reported Symptoms: anxiety, triggered responses, panic, sadness, rumination, focusing issues  Mental Status Exam:  Appearance:   Casual     Behavior:  Appropriate  Motor:  Normal  Speech/Language:   Normal Rate  Affect:  Appropriate tearful  Mood:  anxious  Thought process:  normal  Thought content:    WNL  Sensory/Perceptual disturbances:    WNL  Orientation:  oriented to person, place, time/date, and situation  Attention:  Good  Concentration:  Good  Memory:  WNL  Fund of knowledge:   Good  Insight:    Good  Judgment:   Good  Impulse Control:  Good   Risk Assessment: Danger to Self:  No Self-injurious Behavior: No Danger to Others: No Duty to Warn:no Physical Aggression / Violence:No  Access to Firearms a concern: No  Gang Involvement:No    Subjective: Met with patient via virtual session. She shared she has been busy with taking over the yard work at her house.  She has also taken on planning her high school reunion.  She shared it has been overwhelming because she is having a hard time delegating to others due to others not responding for a few weeks. She went on to share that she is recognizing that even though it is overwhelmed she needs to push through it to help her grow. She shared she knows she needs to contact people but she isn't doing it. Had her do an exercise and she was able to realize that she is very emotional about all of it. She was able to see she used to do everything on their own and since the trauma she hasn't. Discussed importance of creating a new normal again. Patient did processing set on planning the reunion, negative cognition "I'm in over my head", SUDS level 7,  felt panic in her chest.  She was able to reduce SUDS level to 4.She was able to realize she needs to work on her perspective and she has a tendency to feel things need to be perfect and she feels not enough due to not having a husband and children. Had her think through what she needed to tell herself as she is doing the planning to help her keep perspective. She has been seeing that through that she still has lots of ideas and a creative brain that she needs to figure out how to use. Patient reported feeling better at end of session.  Discussed wether or not she wanted note to be shared in [place] and she reported she did not want her notes shared.  Interventions: Cognitive Behavioral Therapy, Solution-Oriented/Positive Psychology, Eye Movement Desensitization and Reprocessing (EMDR), and Insight-Oriented  Diagnosis:   ICD-10-CM   1. PTSD (post-traumatic stress disorder)  F43.10       Plan: Patient is to work on coping skills to decrease anxiety symptoms.  Patient is to work on perspective and reminding herself that things do not have to be perfect.   Patient is to follow-up plans from session to help with reunion by being able to prioritize what is most important and focus on accomplishing those goals.  Patient is to practice brain spotting technique.  Patient is to focus on all that she is doing for her parents and to affirm herself regularly.  Patient is to exercise or try and clean to release negative emotions appropriately.  Patient is to take medication as directed. Long term goal: To recall the traumatic event without becoming overwhelmed and negative emotions Short-term goal: Practice implement relaxation training as a coping mechanism for tension panic stress anger and anxiety  Stevphen Meuse, [facility]

## 2022-10-26 ENCOUNTER — Ambulatory Visit: Payer: BC Managed Care – PPO | Admitting: Psychiatry

## 2022-11-09 ENCOUNTER — Encounter: Payer: Self-pay | Admitting: Psychiatry

## 2022-11-09 ENCOUNTER — Ambulatory Visit (INDEPENDENT_AMBULATORY_CARE_PROVIDER_SITE_OTHER): Payer: BC Managed Care – PPO | Admitting: Psychiatry

## 2022-11-09 VITALS — BP 129/87 | HR 74 | Wt 108.0 lb

## 2022-11-09 DIAGNOSIS — F5105 Insomnia due to other mental disorder: Secondary | ICD-10-CM | POA: Diagnosis not present

## 2022-11-09 DIAGNOSIS — F4310 Post-traumatic stress disorder, unspecified: Secondary | ICD-10-CM | POA: Diagnosis not present

## 2022-11-09 DIAGNOSIS — F902 Attention-deficit hyperactivity disorder, combined type: Secondary | ICD-10-CM | POA: Diagnosis not present

## 2022-11-09 DIAGNOSIS — F99 Mental disorder, not otherwise specified: Secondary | ICD-10-CM

## 2022-11-09 DIAGNOSIS — F3342 Major depressive disorder, recurrent, in full remission: Secondary | ICD-10-CM

## 2022-11-09 MED ORDER — LISDEXAMFETAMINE DIMESYLATE 30 MG PO CAPS
30.0000 mg | ORAL_CAPSULE | Freq: Every day | ORAL | 0 refills | Status: DC
Start: 2022-11-13 — End: 2023-02-17

## 2022-11-09 MED ORDER — LISDEXAMFETAMINE DIMESYLATE 30 MG PO CAPS
30.0000 mg | ORAL_CAPSULE | Freq: Every day | ORAL | 0 refills | Status: DC
Start: 2023-01-08 — End: 2023-03-23

## 2022-11-09 MED ORDER — LISDEXAMFETAMINE DIMESYLATE 30 MG PO CAPS
30.0000 mg | ORAL_CAPSULE | Freq: Every day | ORAL | 0 refills | Status: DC
Start: 2022-12-11 — End: 2023-02-17

## 2022-11-09 NOTE — Progress Notes (Signed)
Marquee Piskor [id number] Jul 02, 1975 47 y.o.  Subjective:   Patient ID:  Laura Simmons is a 47 y.o. (DOB 18-Jan-1976) female.  Chief Complaint: No chief complaint on file.   HPI Laura Simmons presents to the office today for follow-up of ADHD, anxiety, depression, and insomnia. Mood has been "good." She reports that she has not had a menstrual period in about 4 months and notices mood changes when this occurs. She reports that anxiety has been stable. She reports that she feels she has reached "the acceptance stage" in terms of ex-fiance's death. No longer having intrusive memories. Denies nightmares. Energy and motivation have bene good. Sleep has been ok. Appetite has been slightly decreased. Concentration has improved. Denies SI.   She hosted her HS reunion and reports that this was a success. She enjoyed this and made some connections.  Close friend is moving to [place], [place] next week. She reports that this friend tends to encourage her to do more.   She has been helping her mother clean out and organize some spaces in their home.   Parents continue to have some health issues.   She drinks one soda a day and is drinking more water.   Ambien last filled 11/04/22 x 3. Vyvanse last filled 10/16/22.  Klonopin last filled 10/16/22 x 3.   Past Psychiatric Medication Trials: Zyprexa- worsening depression Trileptal- benefits did not outweigh risks Remeron Zoloft Celexa Lexapro Trintellix- Severe n/v Wellbutrin Nortriptyline Ritalin Strattera-no improvement Qelbree- Denied by insurance. Tolerated samples for 1-2 weeks.  Seroquel- RLS Rexulti- Took briefly and may have been slightly helpful. Depakote Gabapentin Propranolol-fatigue Topamax Trazodone- excessive daytime somnolence Klonopin Xanax Ativan Ambien May have taken Lamictal    PHQ2-9    Flowsheet Row Office Visit from 03/25/2016 in [facility] at [facility]  PHQ-2 Total Score 0         Review of Systems:  Review of Systems  Cardiovascular:  Negative for palpitations.  Genitourinary:        Reports LMP was 4 months ago  Musculoskeletal:  Negative for gait problem.  Neurological:  Negative for tremors.  Psychiatric/Behavioral:         Please refer to HPI    Medications: I have reviewed the patient's current medications.  Current Outpatient Medications  Medication Sig Dispense Refill   butalbital-acetaminophen-caffeine (FIORICET) 50-325-40 MG tablet Take 1-2 tabs every 6 hrs as needed for headaches. (Patient not taking: Reported on 05/07/2022) 20 tablet 0   calcium carbonate (TUMS - DOSED IN MG ELEMENTAL CALCIUM) 500 MG chewable tablet Chew 1 tablet by mouth as needed for indigestion or heartburn.     clonazePAM (KLONOPIN) 1 MG tablet TAKE 1/2 TO 1 TABLET BY MOUTH TWICE DAILY AS NEEDED FOR ANXIETY 60 tablet 5   dimenhyDRINATE (DRAMAMINE) 50 MG tablet Take 50 mg by mouth at bedtime as needed for nausea.     diphenhydrAMINE (BENADRYL) 25 MG tablet Take 25 mg by mouth every 6 (six) hours as needed.     fluticasone (FLONASE) 50 MCG/ACT nasal spray Place 2 sprays into both nostrils daily. 16 g 0   ibuprofen (ADVIL) 100 MG tablet Take 100 mg by mouth every 6 (six) hours as needed for fever.     levocetirizine (XYZAL) 5 MG tablet Take 1 tablet (5 mg total) by mouth every evening. (Patient not taking: Reported on 07/27/2022) 30 tablet 5   lisdexamfetamine (VYVANSE) 30 MG capsule Take 1 capsule (30 mg total) by mouth daily. 30 capsule  0   [START ON 11/13/2022] lisdexamfetamine (VYVANSE) 30 MG capsule Take 1 capsule (30 mg total) by mouth daily. 30 capsule 0   [START ON 12/11/2022] lisdexamfetamine (VYVANSE) 30 MG capsule Take 1 capsule (30 mg total) by mouth daily. 30 capsule 0   [START ON 01/08/2023] lisdexamfetamine (VYVANSE) 30 MG capsule Take 1 capsule (30 mg total) by mouth daily. 30 capsule 0   Multiple Vitamin (MULTIVITAMIN WITH MINERALS) TABS tablet Take 1 tablet by  mouth daily. 30 tablet 1   polyethylene glycol (MIRALAX / GLYCOLAX) 17 g packet Take 17 g by mouth daily as needed.     zolpidem (AMBIEN) 5 MG tablet TAKE 1 TABLET(5 MG) BY MOUTH AT BEDTIME AS NEEDED FOR SLEEP 30 tablet 5   No current facility-administered medications for this visit.    Medication Side Effects: None  Allergies:  Allergies  Allergen Reactions   Prochlorperazine Other (See Comments)    lockjaw Other reaction(s): Other   Sumatriptan Succinate Anaphylaxis and Other (See Comments)    ALL TRIPTAN MEDS   Triptans Anaphylaxis and Other (See Comments)    Pt states throat feels as if it is closing.   Ketamine Other (See Comments)    "Pt. Reports she never wants again." major hallucinations   Reglan [Metoclopramide] Other (See Comments)    "crawling out of my skin"    Past Medical History:  Diagnosis Date   ADHD (attention deficit hyperactivity disorder)    Agoraphobia    Basal cell carcinoma    Bipolar 1 disorder (HCC)    patient denies   Chronic pain    just migraines per patient   Endometriosis determined by laparoscopy    GERD (gastroesophageal reflux disease)    High platelet count    Migraine    Personality disorder (HCC)    Right adrenal mass (HCC)    per ct 09-06-2015   Right ovarian cyst    Vaginal Pap smear, abnormal     Past Medical History, Surgical history, Social history, and Family history were reviewed and updated as appropriate.   Please see review of systems for further details on the patient's review from today.   Objective:   Physical Exam:  BP 129/87   Pulse 74   Wt 108 lb (49 kg)   BMI 17.97 kg/m   Physical Exam Constitutional:      General: She is not in acute distress. Musculoskeletal:        General: No deformity.  Neurological:     Mental Status: She is alert and oriented to person, place, and time.     Coordination: Coordination normal.  Psychiatric:        Attention and Perception: Attention and perception normal.  She does not perceive auditory or visual hallucinations.        Mood and Affect: Mood normal. Mood is not anxious or depressed. Affect is not labile, blunt, angry or inappropriate.        Speech: Speech normal.        Behavior: Behavior normal.        Thought Content: Thought content normal. Thought content is not paranoid or delusional. Thought content does not include homicidal or suicidal ideation. Thought content does not include homicidal or suicidal plan.        Cognition and Memory: Cognition and memory normal.        Judgment: Judgment normal.     Comments: Insight intact     Lab Review:     Component Value  Date/Time   NA 138 12/26/2019 1515   K 4.6 12/26/2019 1515   CL 98 12/26/2019 1515   CO2 30 12/26/2019 1515   GLUCOSE 75 12/26/2019 1515   BUN 12 12/26/2019 1515   CREATININE 0.87 12/26/2019 1515   CALCIUM 10.6 (H) 12/26/2019 1515   PROT 7.6 12/26/2019 1515   ALBUMIN 4.5 12/26/2019 1515   AST 13 12/26/2019 1515   ALT 8 12/26/2019 1515   ALKPHOS 60 12/26/2019 1515   BILITOT 0.3 12/26/2019 1515   GFRNONAA >60 08/05/2018 0616   GFRAA >60 08/05/2018 0616       Component Value Date/Time   WBC 6.5 08/06/2018 0533   RBC 4.05 08/06/2018 0533   HGB 13.0 08/06/2018 0533   HCT 37.6 08/06/2018 0533   PLT 268 08/06/2018 0533   MCV 92.8 08/06/2018 0533   MCH 32.1 08/06/2018 0533   MCHC 34.6 08/06/2018 0533   RDW 12.2 08/06/2018 0533   LYMPHSABS 2.7 08/06/2018 0533   MONOABS 0.5 08/06/2018 0533   EOSABS 0.2 08/06/2018 0533   BASOSABS 0.0 08/06/2018 0533    No results found for: "POCLITH", "LITHIUM"   No results found for: "PHENYTOIN", "PHENOBARB", "VALPROATE", "CBMZ"   .res Assessment: Plan:    Will continue current plan of care since target signs and symptoms are well controlled without any tolerability issues. Continue Vyvanse 30 mg daily for ADHD.  Continue Ambien 5 mg po at bedtime prn insomnia.  Continue Klonopin 1 mg 1/2-1 tablet twice daily as needed for  anxiety.  Pt to follow-up in 3 months or sooner if clinically indicated.  Patient advised to contact office with any questions, adverse effects, or acute worsening in signs and symptoms.   Diagnoses and all orders for this visit:  Attention deficit hyperactivity disorder (ADHD), combined type -     lisdexamfetamine (VYVANSE) 30 MG capsule; Take 1 capsule (30 mg total) by mouth daily. -     lisdexamfetamine (VYVANSE) 30 MG capsule; Take 1 capsule (30 mg total) by mouth daily. -     lisdexamfetamine (VYVANSE) 30 MG capsule; Take 1 capsule (30 mg total) by mouth daily.  PTSD (post-traumatic stress disorder)  Insomnia due to other mental disorder  Recurrent major depressive disorder, in full remission [facility])     Please see After Visit Summary for patient specific instructions.  Future Appointments  Date Time Provider Department Center  02/09/2023 12:45 PM Corie Chiquito, PMHNP CP-CP None    No orders of the defined types were placed in this encounter.   -------------------------------

## 2022-12-02 ENCOUNTER — Encounter: Payer: Self-pay | Admitting: Psychiatry

## 2023-01-18 ENCOUNTER — Telehealth: Payer: BC Managed Care – PPO | Admitting: Psychiatry

## 2023-02-09 ENCOUNTER — Ambulatory Visit: Payer: BC Managed Care – PPO | Admitting: Psychiatry

## 2023-02-17 ENCOUNTER — Ambulatory Visit: Payer: BC Managed Care – PPO | Admitting: Adult Health

## 2023-02-17 ENCOUNTER — Encounter: Payer: Self-pay | Admitting: Adult Health

## 2023-02-17 DIAGNOSIS — F4310 Post-traumatic stress disorder, unspecified: Secondary | ICD-10-CM | POA: Diagnosis not present

## 2023-02-17 DIAGNOSIS — F99 Mental disorder, not otherwise specified: Secondary | ICD-10-CM

## 2023-02-17 DIAGNOSIS — F5105 Insomnia due to other mental disorder: Secondary | ICD-10-CM

## 2023-02-17 DIAGNOSIS — F902 Attention-deficit hyperactivity disorder, combined type: Secondary | ICD-10-CM

## 2023-02-17 MED ORDER — ZOLPIDEM TARTRATE 5 MG PO TABS
ORAL_TABLET | ORAL | 5 refills | Status: DC
Start: 2023-02-17 — End: 2023-08-16

## 2023-02-17 MED ORDER — LISDEXAMFETAMINE DIMESYLATE 30 MG PO CAPS
30.0000 mg | ORAL_CAPSULE | Freq: Every day | ORAL | 0 refills | Status: DC
Start: 2023-02-17 — End: 2023-02-19

## 2023-02-17 MED ORDER — LISDEXAMFETAMINE DIMESYLATE 30 MG PO CAPS
30.0000 mg | ORAL_CAPSULE | Freq: Every day | ORAL | 0 refills | Status: DC
Start: 2023-03-17 — End: 2023-05-17

## 2023-02-17 MED ORDER — CLONAZEPAM 1 MG PO TABS
ORAL_TABLET | ORAL | 2 refills | Status: DC
Start: 2023-02-17 — End: 2023-05-17

## 2023-02-17 MED ORDER — LISDEXAMFETAMINE DIMESYLATE 30 MG PO CAPS
30.0000 mg | ORAL_CAPSULE | Freq: Every day | ORAL | 0 refills | Status: DC
Start: 2023-04-14 — End: 2023-05-17

## 2023-02-17 NOTE — Progress Notes (Signed)
Laura Simmons [id number] May 07, 1975 48 y.o.  Subjective:   Patient ID:  Laura Simmons is a 48 y.o. (DOB February 12, 1975) female.  Chief Complaint: No chief complaint on file.   HPI Laura Simmons presents to the office today for follow-up of MDD, GAD, ADD, and insomnia.  Describes mood today as "ok". Pleasant. Denies tearfulness. Mood symptoms - reports some anxiety, depression and irritability. Reports some struggles with interest and motivation. Denies panic attacks. Reports some worry, rumination, and over thinking. Mood is stable. Stating "I feel like like I'm doing better". Feels like current medication regimen is helpful. Taking medications as prescribed.  Energy levels vary. Active, does not have a regular exercise routine.   Enjoys some usual interests and activities. Living with parents - care giving. Spending time with family. Appetite adequate. Weight loss - more active. Sleeps well most nights. Averages 7 to 8 hours. Focus and concentration stable. Completing tasks. Managing aspects of household. Unemployed. Helping parents. Denies SI or HI.  Denies AH or VH. Denies self harm. Denies substance use.   Past Psychiatric Medication Trials: Zyprexa- worsening depression Trileptal- benefits did not outweigh risks Remeron Zoloft Celexa Lexapro Trintellix- Severe n/v Wellbutrin Nortriptyline Ritalin Strattera-no improvement Qelbree- Denied by insurance. Tolerated samples for 1-2 weeks.  Seroquel- RLS Rexulti- Took briefly and may have been slightly helpful. Depakote Gabapentin Propranolol-fatigue Topamax Trazodone- excessive daytime somnolence Klonopin Xanax Ativan Ambien May have taken Lamictal    PHQ2-9    Flowsheet Row Office Visit from 03/25/2016 in [facility]  PHQ-2 Total Score 0        Review of Systems:  Review of Systems  Musculoskeletal:  Negative for gait problem.  Neurological:  Negative for  tremors.  Psychiatric/Behavioral:         Please refer to HPI    Medications: I have reviewed the patient's current medications.  Current Outpatient Medications  Medication Sig Dispense Refill   butalbital-acetaminophen-caffeine (FIORICET) 50-325-40 MG tablet Take 1-2 tabs every 6 hrs as needed for headaches. (Patient not taking: Reported on 05/07/2022) 20 tablet 0   calcium carbonate (TUMS - DOSED IN MG ELEMENTAL CALCIUM) 500 MG chewable tablet Chew 1 tablet by mouth as needed for indigestion or heartburn.     clonazePAM (KLONOPIN) 1 MG tablet TAKE 1/2 TO 1 TABLET BY MOUTH TWICE DAILY AS NEEDED FOR ANXIETY 60 tablet 5   dimenhyDRINATE (DRAMAMINE) 50 MG tablet Take 50 mg by mouth at bedtime as needed for nausea.     diphenhydrAMINE (BENADRYL) 25 MG tablet Take 25 mg by mouth every 6 (six) hours as needed.     fluticasone (FLONASE) 50 MCG/ACT nasal spray Place 2 sprays into both nostrils daily. 16 g 0   ibuprofen (ADVIL) 100 MG tablet Take 100 mg by mouth every 6 (six) hours as needed for fever.     levocetirizine (XYZAL) 5 MG tablet Take 1 tablet (5 mg total) by mouth every evening. (Patient not taking: Reported on 07/27/2022) 30 tablet 5   lisdexamfetamine (VYVANSE) 30 MG capsule Take 1 capsule (30 mg total) by mouth daily. 30 capsule 0   lisdexamfetamine (VYVANSE) 30 MG capsule Take 1 capsule (30 mg total) by mouth daily. 30 capsule 0   lisdexamfetamine (VYVANSE) 30 MG capsule Take 1 capsule (30 mg total) by mouth daily. 30 capsule 0   lisdexamfetamine (VYVANSE) 30 MG capsule Take 1 capsule (30 mg total) by mouth daily. 30 capsule 0   Multiple Vitamin (MULTIVITAMIN WITH MINERALS) TABS  tablet Take 1 tablet by mouth daily. 30 tablet 1   polyethylene glycol (MIRALAX / GLYCOLAX) 17 g packet Take 17 g by mouth daily as needed.     zolpidem (AMBIEN) 5 MG tablet TAKE 1 TABLET(5 MG) BY MOUTH AT BEDTIME AS NEEDED FOR SLEEP 30 tablet 5   No current facility-administered medications for this visit.     Medication Side Effects: None  Allergies:  Allergies  Allergen Reactions   Prochlorperazine Other (See Comments)    lockjaw Other reaction(s): Other   Sumatriptan Succinate Anaphylaxis and Other (See Comments)    ALL TRIPTAN MEDS   Triptans Anaphylaxis and Other (See Comments)    Pt states throat feels as if it is closing.   Ketamine Other (See Comments)    "Pt. Reports she never wants again." major hallucinations   Reglan [Metoclopramide] Other (See Comments)    "crawling out of my skin"    Past Medical History:  Diagnosis Date   ADHD (attention deficit hyperactivity disorder)    Agoraphobia    Basal cell carcinoma    Bipolar 1 disorder (HCC)    patient denies   Chronic pain    just migraines per patient   Endometriosis determined by laparoscopy    GERD (gastroesophageal reflux disease)    High platelet count    Migraine    Personality disorder (HCC)    Right adrenal mass (HCC)    per ct 09-06-2015   Right ovarian cyst    Vaginal Pap smear, abnormal     Past Medical History, Surgical history, Social history, and Family history were reviewed and updated as appropriate.   Please see review of systems for further details on the patient's review from today.   Objective:   Physical Exam:  There were no vitals taken for this visit.  Physical Exam Constitutional:      General: She is not in acute distress. Musculoskeletal:        General: No deformity.  Neurological:     Mental Status: She is alert and oriented to person, place, and time.     Coordination: Coordination normal.  Psychiatric:        Attention and Perception: Attention and perception normal. She does not perceive auditory or visual hallucinations.        Mood and Affect: Affect is not labile, blunt, angry or inappropriate.        Speech: Speech normal.        Behavior: Behavior normal.        Thought Content: Thought content normal. Thought content is not paranoid or delusional. Thought  content does not include homicidal or suicidal ideation. Thought content does not include homicidal or suicidal plan.        Cognition and Memory: Cognition and memory normal.        Judgment: Judgment normal.     Comments: Insight intact     Lab Review:     Component Value Date/Time   NA 138 12/26/2019 1515   K 4.6 12/26/2019 1515   CL 98 12/26/2019 1515   CO2 30 12/26/2019 1515   GLUCOSE 75 12/26/2019 1515   BUN 12 12/26/2019 1515   CREATININE 0.87 12/26/2019 1515   CALCIUM 10.6 (H) 12/26/2019 1515   PROT 7.6 12/26/2019 1515   ALBUMIN 4.5 12/26/2019 1515   AST 13 12/26/2019 1515   ALT 8 12/26/2019 1515   ALKPHOS 60 12/26/2019 1515   BILITOT 0.3 12/26/2019 1515   GFRNONAA >60 08/05/2018 0102  GFRAA >60 08/05/2018 0616       Component Value Date/Time   WBC 6.5 08/06/2018 0533   RBC 4.05 08/06/2018 0533   HGB 13.0 08/06/2018 0533   HCT 37.6 08/06/2018 0533   PLT 268 08/06/2018 0533   MCV 92.8 08/06/2018 0533   MCH 32.1 08/06/2018 0533   MCHC 34.6 08/06/2018 0533   RDW 12.2 08/06/2018 0533   LYMPHSABS 2.7 08/06/2018 0533   MONOABS 0.5 08/06/2018 0533   EOSABS 0.2 08/06/2018 0533   BASOSABS 0.0 08/06/2018 0533    No results found for: "POCLITH", "LITHIUM"   No results found for: "PHENYTOIN", "PHENOBARB", "VALPROATE", "CBMZ"   .res Assessment: Plan:   Will continue current plan of care since target signs and symptoms are well controlled without any tolerability issues.  Continue: Vyvanse 30 mg daily for ADHD.  Ambien 5 mg po at bedtime prn insomnia.  Klonopin 1 mg 1/2-1 tablet twice daily as needed for anxiety.   25 minutes spent dedicated to the care of this patient on the date of this encounter to include pre-visit review of records, ordering of medication, post visit documentation, and face-to-face time with the patient discussing MDD, GAD, ADD, and insomnia. Discussed continuing current medications to help manage mood symptoms.  Pt to follow-up in 3 months  or sooner if clinically indicated.   Patient advised to contact office with any questions, adverse effects, or acute worsening in signs and symptoms.  Diagnoses and all orders for this visit:  Attention deficit hyperactivity disorder (ADHD), combined type  PTSD (post-traumatic stress disorder)  Insomnia due to other mental disorder     Please see After Visit Summary for patient specific instructions.  No future appointments.   No orders of the defined types were placed in this encounter.   -------------------------------

## 2023-02-19 ENCOUNTER — Telehealth: Payer: Self-pay | Admitting: Adult Health

## 2023-02-19 ENCOUNTER — Other Ambulatory Visit: Payer: Self-pay

## 2023-02-19 DIAGNOSIS — F902 Attention-deficit hyperactivity disorder, combined type: Secondary | ICD-10-CM

## 2023-02-19 MED ORDER — LISDEXAMFETAMINE DIMESYLATE 30 MG PO CAPS: 30.0000 mg | ORAL_CAPSULE | Freq: Every day | ORAL | 0 refills | Status: DC

## 2023-02-19 NOTE — Telephone Encounter (Signed)
Nami lvm stating the Vyvanse is out of stock at her usual pharmacy [facility]. Fill at the [facility] on [address]. Patient has verified that it is in stock.

## 2023-02-19 NOTE — Telephone Encounter (Signed)
Cx vyvanse 1/29 rx at org pharm and pended to [facility] on [address]

## 2023-03-23 ENCOUNTER — Other Ambulatory Visit: Payer: Self-pay

## 2023-03-23 ENCOUNTER — Telehealth: Payer: Self-pay | Admitting: Adult Health

## 2023-03-23 DIAGNOSIS — F902 Attention-deficit hyperactivity disorder, combined type: Secondary | ICD-10-CM

## 2023-03-23 NOTE — Telephone Encounter (Signed)
 Laura Simmons called and LM at 4:09 stating the [facility] still does not have the Vyvanse in stock.  She asked that [facility] as [facility] was before.  Next appt 4/28

## 2023-03-23 NOTE — Telephone Encounter (Signed)
 P[facility] to [facility], need to cancel at [facility] [phone]

## 2023-03-24 MED ORDER — LISDEXAMFETAMINE DIMESYLATE 30 MG PO CAPS: 30.0000 mg | ORAL_CAPSULE | Freq: Every day | ORAL | 0 refills | Status: DC

## 2023-03-24 NOTE — Telephone Encounter (Signed)
Canceled at [facility].  

## 2023-04-23 ENCOUNTER — Telehealth: Payer: Self-pay | Admitting: Adult Health

## 2023-04-23 NOTE — Telephone Encounter (Signed)
 Pt lvm that the vyvanse generic needs to be cancelled at [facility] and [facility] [address]. [facility] doesn't have it in stock

## 2023-04-26 ENCOUNTER — Other Ambulatory Visit: Payer: Self-pay

## 2023-04-26 DIAGNOSIS — F902 Attention-deficit hyperactivity disorder, combined type: Secondary | ICD-10-CM

## 2023-04-26 NOTE — Telephone Encounter (Signed)
Filled 4/4.  

## 2023-04-26 NOTE — Telephone Encounter (Signed)
 Confirmed with pharmacy that she picked up the brand Vyvanse

## 2023-04-27 NOTE — Telephone Encounter (Signed)
 Pt had requested Rx for generic Vyvanse, but filled brand and picked up.

## 2023-05-17 ENCOUNTER — Telehealth (INDEPENDENT_AMBULATORY_CARE_PROVIDER_SITE_OTHER): Payer: BC Managed Care – PPO | Admitting: Adult Health

## 2023-05-17 ENCOUNTER — Encounter: Payer: Self-pay | Admitting: Adult Health

## 2023-05-17 DIAGNOSIS — F989 Unspecified behavioral and emotional disorders with onset usually occurring in childhood and adolescence: Secondary | ICD-10-CM | POA: Diagnosis not present

## 2023-05-17 DIAGNOSIS — F3342 Major depressive disorder, recurrent, in full remission: Secondary | ICD-10-CM

## 2023-05-17 DIAGNOSIS — F339 Major depressive disorder, recurrent, unspecified: Secondary | ICD-10-CM | POA: Diagnosis not present

## 2023-05-17 DIAGNOSIS — F4310 Post-traumatic stress disorder, unspecified: Secondary | ICD-10-CM

## 2023-05-17 DIAGNOSIS — F902 Attention-deficit hyperactivity disorder, combined type: Secondary | ICD-10-CM

## 2023-05-17 DIAGNOSIS — G4700 Insomnia, unspecified: Secondary | ICD-10-CM | POA: Diagnosis not present

## 2023-05-17 DIAGNOSIS — F5105 Insomnia due to other mental disorder: Secondary | ICD-10-CM

## 2023-05-17 MED ORDER — LISDEXAMFETAMINE DIMESYLATE 30 MG PO CAPS
30.0000 mg | ORAL_CAPSULE | Freq: Every day | ORAL | 0 refills | Status: DC
Start: 2023-07-12 — End: 2023-08-16

## 2023-05-17 MED ORDER — LISDEXAMFETAMINE DIMESYLATE 30 MG PO CAPS
30.0000 mg | ORAL_CAPSULE | Freq: Every day | ORAL | 0 refills | Status: DC
Start: 2023-06-14 — End: 2023-08-16

## 2023-05-17 MED ORDER — LISDEXAMFETAMINE DIMESYLATE 30 MG PO CAPS
30.0000 mg | ORAL_CAPSULE | Freq: Every day | ORAL | 0 refills | Status: DC
Start: 2023-05-17 — End: 2023-08-16

## 2023-05-17 MED ORDER — CLONAZEPAM 1 MG PO TABS: ORAL_TABLET | ORAL | 2 refills | Status: DC

## 2023-05-17 NOTE — Progress Notes (Signed)
 Laura Simmons [id number] 05-31-1975 48 y.o.  Virtual Visit via Video Note  I connected with pt @ on 05/17/23 at 12:00 PM EDT by a video enabled telemedicine application and verified that I am speaking with the correct person using two identifiers.   I discussed the limitations of evaluation and management by telemedicine and the availability of in person appointments. The patient expressed understanding and agreed to proceed.  I discussed the assessment and treatment plan with the patient. The patient was provided an opportunity to ask questions and all were answered. The patient agreed with the plan and demonstrated an understanding of the instructions.   The patient was advised to call back or seek an in-person evaluation if the symptoms worsen or if the condition fails to improve as anticipated.  I provided 25 minutes of non-face-to-face time during this encounter.  The patient was located at home.  The provider was located at [facility].   Laura Camera, NP   Subjective:   Patient ID:  Laura Simmons is a 48 y.o. (DOB 1975/12/05) female.  Chief Complaint: No chief complaint on file.   HPI Laura Simmons presents for follow-up of MDD, GAD, ADD and insomnia.  Describes mood today as "ok". Pleasant. Denies tearfulness. Mood symptoms - reports some anxiety, depression and irritability - "feeling overwhelmed". Reports decreased struggles with interest and motivation. Denies panic attacks. Denies worry, rumination, and over thinking. Reports mood is stable. Stating "I feel like like I'm doing ok, generally". Feels like current medication regimen is helpful. Taking medications as prescribed.  Energy levels vary. Active, has a regular exercise routine.   Enjoys some usual interests and activities. Living with parents - care giving. Spending time with family. Appetite adequate. Weight stable - 110 pounds. Sleeps well most nights. Averages 7 to 8 hours. Focus and  concentration mostly stable - brain fog - "possibly hormonal". Completing tasks. Managing aspects of household. Caregiver for parents.  Denies SI or HI.  Denies AH or VH. Denies self harm. Denies substance use.   Past Psychiatric Medication Trials: Zyprexa - worsening depression Trileptal - benefits did not outweigh risks Remeron  Zoloft Celexa Lexapro Trintellix - Severe n/v Wellbutrin  Nortriptyline  Ritalin  Strattera -no improvement Qelbree - Denied by insurance. Tolerated samples for 1-2 weeks.  Seroquel- RLS Rexulti - Took briefly and may have been slightly helpful. Depakote Gabapentin  Propranolol-fatigue Topamax Trazodone- excessive daytime somnolence Klonopin  Xanax  Ativan  Ambien  May have taken Lamictal  Review of Systems:  Review of Systems  Musculoskeletal:  Negative for gait problem.  Neurological:  Negative for tremors.  Psychiatric/Behavioral:         Please refer to HPI    Medications: I have reviewed the patient's current medications.  Current Outpatient Medications  Medication Sig Dispense Refill   butalbital -acetaminophen -caffeine  (FIORICET ) 50-325-40 MG tablet Take 1-2 tabs every 6 hrs as needed for headaches. (Patient not taking: Reported on 05/07/2022) 20 tablet 0   calcium  carbonate (TUMS - DOSED IN MG ELEMENTAL CALCIUM ) 500 MG chewable tablet Chew 1 tablet by mouth as needed for indigestion or heartburn.     clonazePAM  (KLONOPIN ) 1 MG tablet TAKE 1/2 TO 1 TABLET BY MOUTH TWICE DAILY AS NEEDED FOR ANXIETY 60 tablet 2   dimenhyDRINATE (DRAMAMINE) 50 MG tablet Take 50 mg by mouth at bedtime as needed for nausea.     diphenhydrAMINE  (BENADRYL ) 25 MG tablet Take 25 mg by mouth every 6 (six) hours as needed.     fluticasone  (FLONASE ) 50 MCG/ACT nasal spray Place 2 sprays into both nostrils daily. 16 g  0   ibuprofen  (ADVIL ) 100 MG tablet Take 100 mg by mouth every 6 (six) hours as needed for fever.     levocetirizine (XYZAL ) 5 MG tablet Take 1 tablet (5 mg total)  by mouth every evening. (Patient not taking: Reported on 07/27/2022) 30 tablet 5   lisdexamfetamine (VYVANSE ) 30 MG capsule Take 1 capsule (30 mg total) by mouth daily. 30 capsule 0   lisdexamfetamine (VYVANSE ) 30 MG capsule Take 1 capsule (30 mg total) by mouth daily. 30 capsule 0   lisdexamfetamine (VYVANSE ) 30 MG capsule Take 1 capsule (30 mg total) by mouth daily. 30 capsule 0   lisdexamfetamine (VYVANSE ) 30 MG capsule Take 1 capsule (30 mg total) by mouth daily. 30 capsule 0   Multiple Vitamin (MULTIVITAMIN WITH MINERALS) TABS tablet Take 1 tablet by mouth daily. 30 tablet 1   polyethylene glycol (MIRALAX / GLYCOLAX) 17 g packet Take 17 g by mouth daily as needed.     zolpidem  (AMBIEN ) 5 MG tablet TAKE 1 TABLET(5 MG) BY MOUTH AT BEDTIME AS NEEDED FOR SLEEP 30 tablet 5   No current facility-administered medications for this visit.    Medication Side Effects: None  Allergies:  Allergies  Allergen Reactions   Prochlorperazine Other (See Comments)    lockjaw Other reaction(s): Other   Sumatriptan Succinate Anaphylaxis and Other (See Comments)    ALL TRIPTAN MEDS   Triptans Anaphylaxis and Other (See Comments)    Pt states throat feels as if it is closing.   Ketamine  Other (See Comments)    "Pt. Reports she never wants again." major hallucinations   Reglan  [Metoclopramide ] Other (See Comments)    "crawling out of my skin"    Past Medical History:  Diagnosis Date   ADHD (attention deficit hyperactivity disorder)    Agoraphobia    Basal cell carcinoma    Bipolar 1 disorder (HCC)    patient denies   Chronic pain    just migraines per patient   Endometriosis determined by laparoscopy    GERD (gastroesophageal reflux disease)    High platelet count    Migraine    Personality disorder (HCC)    Right adrenal mass (HCC)    per ct 09-06-2015   Right ovarian cyst    Vaginal Pap smear, abnormal     Family History  Problem Relation Age of Onset   Migraines Mother    Anxiety  disorder Mother    Diabetes Maternal Grandmother    Stroke Maternal Grandfather    Heart attack Paternal Grandfather    Anxiety disorder Paternal Aunt    Post-traumatic stress disorder Cousin        After [place]    Social History   Socioeconomic History   Marital status: Single    Spouse name: Not on file   Number of children: Not on file   Years of education: Not on file   Highest education level: Not on file  Occupational History   Not on file  Tobacco Use   Smoking status: Every Day    Current packs/day: 0.50    Average packs/day: 0.5 packs/day for 20.0 years (10.0 ttl pk-yrs)    Types: Cigarettes   Smokeless tobacco: Never  Substance and Sexual Activity   Alcohol use: Yes    Comment: very rare   Drug use: No   Sexual activity: Not Currently    Birth control/protection: None  Other Topics Concern   Not on file  Social History Narrative   Lives with parents  in a one story home.  No children.     Currently not working.  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    Education: college   Social Drivers of Corporate investment banker Strain: Not on file  Food Insecurity: Not on file  Transportation Needs: Not on file  Physical Activity: Not on file  Stress: Not on file  Social Connections: Not on file  Intimate Partner Violence: Not on file    Past Medical History, Surgical history, Social history, and Family history were reviewed and updated as appropriate.   Please see review of systems for further details on the patient's review from today.   Objective:   Physical Exam:  There were no vitals taken for this visit.  Physical Exam Constitutional:      General: She is not in acute distress. Musculoskeletal:        General: No deformity.  Neurological:     Mental Status: She is alert and oriented to person, place, and time.     Coordination: Coordination normal.  Psychiatric:        Attention and Perception: Attention and perception  normal. She does not perceive auditory or visual hallucinations.        Mood and Affect: Mood normal. Mood is not anxious or depressed. Affect is not labile, blunt, angry or inappropriate.        Speech: Speech normal.        Behavior: Behavior normal.        Thought Content: Thought content normal. Thought content is not paranoid or delusional. Thought content does not include homicidal or suicidal ideation. Thought content does not include homicidal or suicidal plan.        Cognition and Memory: Cognition and memory normal.        Judgment: Judgment normal.     Comments: Insight intact     Lab Review:     Component Value Date/Time   NA 138 12/26/2019 1515   K 4.6 12/26/2019 1515   CL 98 12/26/2019 1515   CO2 30 12/26/2019 1515   GLUCOSE 75 12/26/2019 1515   BUN 12 12/26/2019 1515   CREATININE 0.87 12/26/2019 1515   CALCIUM  10.6 (H) 12/26/2019 1515   PROT 7.6 12/26/2019 1515   ALBUMIN 4.5 12/26/2019 1515   AST 13 12/26/2019 1515   ALT 8 12/26/2019 1515   ALKPHOS 60 12/26/2019 1515   BILITOT 0.3 12/26/2019 1515   GFRNONAA >60 08/05/2018 0616   GFRAA >60 08/05/2018 0616       Component Value Date/Time   WBC 6.5 08/06/2018 0533   RBC 4.05 08/06/2018 0533   HGB 13.0 08/06/2018 0533   HCT 37.6 08/06/2018 0533   PLT 268 08/06/2018 0533   MCV 92.8 08/06/2018 0533   MCH 32.1 08/06/2018 0533   MCHC 34.6 08/06/2018 0533   RDW 12.2 08/06/2018 0533   LYMPHSABS 2.7 08/06/2018 0533   MONOABS 0.5 08/06/2018 0533   EOSABS 0.2 08/06/2018 0533   BASOSABS 0.0 08/06/2018 0533    No results found for: "POCLITH", "LITHIUM"   No results found for: "PHENYTOIN", "PHENOBARB", "VALPROATE", "CBMZ"   .res Assessment: Plan:    Will continue current plan of care since target signs and symptoms are well controlled without any tolerability issues.  Continue: Vyvanse  30 mg daily for ADHD.  Ambien  5 mg po at bedtime prn insomnia.  Klonopin  1 mg 1/2-1 tablet twice daily as needed for  anxiety.   25 minutes spent  dedicated to the care of this patient on the date of this encounter to include pre-visit review of records, ordering of medication, post visit documentation, and face-to-face time with the patient discussing MDD, GAD, ADD, and insomnia. Discussed continuing current medications to help manage mood symptoms.  Pt to follow-up in 3 months or sooner if clinically indicated.   Discussed potential benefits, risk, and side effects of benzodiazepines to include potential risk of tolerance and dependence, as well as possible drowsiness.  Advised patient not to drive if experiencing drowsiness and to take lowest possible effective dose to minimize risk of dependence and tolerance.   Discussed potential benefits, risks, and side effects of stimulants with patient to include increased heart rate, palpitations, insomnia, increased anxiety, increased irritability, or decreased appetite.  Instructed patient to contact office if experiencing any significant tolerability issues.   Patient advised to contact office with any questions, adverse effects, or acute worsening in signs and symptoms.  There are no diagnoses linked to this encounter.   Please see After Visit Summary for patient specific instructions.  Future Appointments  Date Time Provider Department Center  05/17/2023 12:00 PM Ruthell Feigenbaum Nattalie, NP [facility] None    No orders of the defined types were placed in this encounter.     -------------------------------

## 2023-05-28 ENCOUNTER — Ambulatory Visit (INDEPENDENT_AMBULATORY_CARE_PROVIDER_SITE_OTHER): Admitting: Family Medicine

## 2023-05-28 VITALS — BP 124/70 | HR 78 | Temp 98.0°F | Resp 16 | Ht 65.0 in | Wt 106.0 lb

## 2023-05-28 DIAGNOSIS — R232 Flushing: Secondary | ICD-10-CM

## 2023-05-28 DIAGNOSIS — N951 Menopausal and female climacteric states: Secondary | ICD-10-CM

## 2023-05-28 DIAGNOSIS — R519 Headache, unspecified: Secondary | ICD-10-CM | POA: Diagnosis not present

## 2023-05-28 DIAGNOSIS — G8929 Other chronic pain: Secondary | ICD-10-CM

## 2023-05-28 MED ORDER — VENLAFAXINE HCL ER 37.5 MG PO CP24
37.5000 mg | ORAL_CAPSULE | Freq: Every day | ORAL | 2 refills | Status: DC
Start: 2023-05-28 — End: 2023-06-28

## 2023-05-28 MED ORDER — BUTALBITAL-APAP-CAFFEINE 50-325-40 MG PO TABS: ORAL_TABLET | ORAL | 0 refills | Status: DC

## 2023-05-28 NOTE — Progress Notes (Signed)
 Chief Complaint  Patient presents with   Follow-up    Follow up     Subjective: Patient is a 48 y.o. female here for hot flashes.  The patient's last period was in December 2024.  It lasted a couple days which is shorter than usual.  Since that time, she has had worsening hot flashes during the day and night sweats overnight.  This has significantly disrupted her sleep which is led to increased tension headaches, irritability, mental sluggishness.  She was prescribed estrogen in the past which seems to make endometriosis symptoms worse.  Past Medical History:  Diagnosis Date   ADHD (attention deficit hyperactivity disorder)    Agoraphobia    Basal cell carcinoma    Bipolar 1 disorder (HCC)    patient denies   Chronic pain    just migraines per patient   Endometriosis determined by laparoscopy    GERD (gastroesophageal reflux disease)    High platelet count    Migraine    Personality disorder (HCC)    Right adrenal mass (HCC)    per ct 09-06-2015   Right ovarian cyst    Vaginal Pap smear, abnormal     Objective: BP 124/70 (BP Location: Left Arm, Patient Position: Sitting)   Pulse 78   Temp 98 F (36.7 C) (Oral)   Resp 16   Ht 5\' 5"  (1.651 m)   Wt 106 lb (48.1 kg)   SpO2 96%   BMI 17.64 kg/m  General: Awake, appears stated age Heart: RRR, no LE edema Lungs: CTAB, no rales, wheezes or rhonchi. No accessory muscle use Neuro: DTRs equal and symmetric throughout, no clonus, no cerebellar signs, 5/5 strength throughout MSK: No TTP over the TMJ, temporalis, or cervical paraspinal musculature bilaterally Psych: Age appropriate judgment and insight, normal affect and mood  Assessment and Plan: Perimenopause  Hot flashes - Plan: venlafaxine XR (EFFEXOR XR) 37.5 MG 24 hr capsule  Chronic daily headache  Chronic nonintractable headache, unspecified headache type  Chronic, not currently controlled.  Start venlafaxine 37.5 mg daily.  She is going to schedule an appointment  with the gynecologist for further management.  She has failed pregabalin  and gabapentin .  I think if we can fix her hot flashes and night sweats, the rest of her symptoms will improve with regular sleep.  Consider black cohosh or soy products.  I will tentatively see her in 1 month to recheck. The patient voiced understanding and agreement to the plan.  Shellie Dials [place], DO 05/28/23  2:34 PM

## 2023-05-28 NOTE — Patient Instructions (Signed)
 Consider soy products or black cohosh to help with symptoms.   Keep the diet clean and stay active.  Let us  know if you need anything.

## 2023-06-28 ENCOUNTER — Encounter: Payer: Self-pay | Admitting: Family Medicine

## 2023-06-28 ENCOUNTER — Ambulatory Visit (INDEPENDENT_AMBULATORY_CARE_PROVIDER_SITE_OTHER): Admitting: Family Medicine

## 2023-06-28 VITALS — BP 118/72 | HR 78 | Temp 98.0°F | Resp 16 | Ht 65.0 in | Wt 109.8 lb

## 2023-06-28 DIAGNOSIS — N951 Menopausal and female climacteric states: Secondary | ICD-10-CM | POA: Diagnosis not present

## 2023-06-28 DIAGNOSIS — H6991 Unspecified Eustachian tube disorder, right ear: Secondary | ICD-10-CM | POA: Diagnosis not present

## 2023-06-28 MED ORDER — GABAPENTIN 100 MG PO CAPS: ORAL_CAPSULE | ORAL | 1 refills | Status: DC

## 2023-06-28 MED ORDER — PREDNISONE 20 MG PO TABS: 40.0000 mg | ORAL_TABLET | Freq: Every day | ORAL | 0 refills | Status: AC

## 2023-06-28 NOTE — Progress Notes (Signed)
 Chief Complaint  Patient presents with   Follow-up    Follow Up    Subjective: Patient is a 48 y.o. female here for f/u.  Patient is following up for perimenopausal symptoms.  She was started venlafaxine  37.5 mg daily.  She reports compliance.  It made her emotionally numb as other mood affecting medications have done to her in the past.  This was unfortunate as it did help with her symptoms.  She is interested in hormonal treatment at this time.  She does not have a gynecologist currently.  She is working on seeing one.  She also reports right ear pain for the past several days.  She had an allergy flare with some congestion and sinus pressure.  She has been using her nasal spray without significant relief.  Past Medical History:  Diagnosis Date   ADHD (attention deficit hyperactivity disorder)    Agoraphobia    Basal cell carcinoma    Bipolar 1 disorder (HCC)    patient denies   Chronic pain    just migraines per patient   Endometriosis determined by laparoscopy    GERD (gastroesophageal reflux disease)    High platelet count    Migraine    Personality disorder (HCC)    Right adrenal mass (HCC)    per ct 09-06-2015   Right ovarian cyst    Vaginal Pap smear, abnormal     Objective: BP 118/72 (BP Location: Left Arm, Patient Position: Sitting)   Pulse 78   Temp 98 F (36.7 C) (Oral)   Resp 16   Ht 5\' 5"  (1.651 m)   Wt 109 lb 12.8 oz (49.8 kg)   SpO2 98%   BMI 18.27 kg/m  General: Awake, appears stated age HEENT: Canals are patent without otorrhea, TMs negative bilaterally, no sinus TTP, MMM, no pharyngeal exudate or erythema, nares are patent without rhinorrhea Heart: RRR, no LE edema Lungs: CTAB, no rales, wheezes or rhonchi. No accessory muscle use Psych: Age appropriate judgment and insight, normal affect and mood  Assessment and Plan: Dysfunction of right eustachian tube - Plan: predniSONE  (DELTASONE ) 20 MG tablet  Perimenopause  5-day prednisone  burst 40 mg  daily. Chronic, not controlled.  Stop venlafaxine  due to adverse effects.  Start gabapentin  100-300 mg nightly.  She will reach out to her gynecology team. I will see her in 6 months for her physical or as needed. The patient voiced understanding and agreement to the plan.  Shellie Dials [place], DO 06/28/23  4:45 PM

## 2023-06-28 NOTE — Patient Instructions (Addendum)
 Take the Effexor  every other day for 4 doses and then stop.   Continue your nasal spray.  Let us  know if you need anything.

## 2023-07-19 ENCOUNTER — Other Ambulatory Visit: Payer: Self-pay | Admitting: Family Medicine

## 2023-07-20 ENCOUNTER — Encounter: Payer: Self-pay | Admitting: Family Medicine

## 2023-07-20 ENCOUNTER — Other Ambulatory Visit: Payer: Self-pay | Admitting: Family Medicine

## 2023-07-20 MED ORDER — BUTALBITAL-APAP-CAFFEINE 50-325-40 MG PO TABS: ORAL_TABLET | ORAL | 5 refills | Status: DC

## 2023-07-20 NOTE — Telephone Encounter (Signed)
 Requesting: Fioricet   Contract: n/a UDS: n/a  Last Visit: 06/28/23 Next Visit: 01/03/24 Last Refill: 05/28/23 #30 and 0RF   Please Advise

## 2023-07-20 NOTE — Telephone Encounter (Signed)
 Copied from CRM [id number]. Topic: Clinical - Medication Refill >> Jul 20, 2023 12:35 PM Franky GRADE wrote: Medication: butalbital -acetaminophen -caffeine  (FIORICET ) 50-325-40 MG tablet [[id number]]  Has the patient contacted their pharmacy? Yes, a request was sent to the pharmacy (Agent: If no, request that the patient contact the pharmacy for the refill. If patient does not wish to contact the pharmacy document the reason why and proceed with request.) (Agent: If yes, when and what did the pharmacy advise?)  This is the patient's preferred pharmacy:  [id number] - [place], [place] - [address] AT [facility] [address] [place] [address] Phone: [phone] Fax: [phone] Is this the correct pharmacy for this prescription? Yes If no, delete pharmacy and type the correct one.   Has the prescription been filled recently? No  Is the patient out of the medication? Yes  Has the patient been seen for an appointment in the last year OR does the patient have an upcoming appointment? Yes  Can we respond through MyChart? Yes  Agent: Please be advised that Rx refills may take up to 3 business days. We ask that you follow-up with your pharmacy.

## 2023-07-20 NOTE — Telephone Encounter (Signed)
 Copied from CRM [id number]. Topic: Clinical - Medication Refill >> Jul 20, 2023 12:35 PM Franky GRADE wrote: Medication: butalbital -acetaminophen -caffeine  (FIORICET ) 50-325-40 MG tablet [[id number]]  Has the patient contacted their pharmacy? Yes, a request was sent to the pharmacy (Agent: If no, request that the patient contact the pharmacy for the refill. If patient does not wish to contact the pharmacy document the reason why and proceed with request.) (Agent: If yes, when and what did the pharmacy advise?)  This is the patient's preferred pharmacy:  [id number] - [place], [place] - [address] AT [facility] [address] [place] [address] Phone: [phone] Fax: [phone] Is this the correct pharmacy for this prescription? Yes If no, delete pharmacy and type the correct one.   Has the prescription been filled recently? No  Is the patient out of the medication? Yes  Has the patient been seen for an appointment in the last year OR does the patient have an upcoming appointment? Yes  Can we respond through MyChart? Yes  Agent: Please be advised that Rx refills may take up to 3 business days. We ask that you follow-up with your pharmacy.

## 2023-08-16 ENCOUNTER — Encounter: Payer: Self-pay | Admitting: Adult Health

## 2023-08-16 ENCOUNTER — Telehealth: Admitting: Adult Health

## 2023-08-16 DIAGNOSIS — G4700 Insomnia, unspecified: Secondary | ICD-10-CM | POA: Diagnosis not present

## 2023-08-16 DIAGNOSIS — F329 Major depressive disorder, single episode, unspecified: Secondary | ICD-10-CM | POA: Diagnosis not present

## 2023-08-16 DIAGNOSIS — F909 Attention-deficit hyperactivity disorder, unspecified type: Secondary | ICD-10-CM

## 2023-08-16 DIAGNOSIS — F5105 Insomnia due to other mental disorder: Secondary | ICD-10-CM

## 2023-08-16 DIAGNOSIS — F4310 Post-traumatic stress disorder, unspecified: Secondary | ICD-10-CM

## 2023-08-16 DIAGNOSIS — F411 Generalized anxiety disorder: Secondary | ICD-10-CM | POA: Diagnosis not present

## 2023-08-16 DIAGNOSIS — F902 Attention-deficit hyperactivity disorder, combined type: Secondary | ICD-10-CM

## 2023-08-16 DIAGNOSIS — F3342 Major depressive disorder, recurrent, in full remission: Secondary | ICD-10-CM

## 2023-08-16 MED ORDER — CLONAZEPAM 1 MG PO TABS: ORAL_TABLET | ORAL | 2 refills | Status: DC

## 2023-08-16 MED ORDER — LISDEXAMFETAMINE DIMESYLATE 30 MG PO CAPS: 30.0000 mg | ORAL_CAPSULE | Freq: Every day | ORAL | 0 refills | Status: DC

## 2023-08-16 MED ORDER — ZOLPIDEM TARTRATE 5 MG PO TABS: ORAL_TABLET | ORAL | 2 refills | Status: DC

## 2023-08-16 NOTE — Progress Notes (Signed)
 Laura Simmons [id number] 1975-03-13 48 y.o.  Virtual Visit via Video Note  I connected with pt @ on 08/16/23 at 11:30 AM EDT by a video enabled telemedicine application and verified that I am speaking with the correct person using two identifiers.   I discussed the limitations of evaluation and management by telemedicine and the availability of in person appointments. The patient expressed understanding and agreed to proceed.  I discussed the assessment and treatment plan with the patient. The patient was provided an opportunity to ask questions and all were answered. The patient agreed with the plan and demonstrated an understanding of the instructions.   The patient was advised to call back or seek an in-person evaluation if the symptoms worsen or if the condition fails to improve as anticipated.  I provided 25 minutes of non-face-to-face time during this encounter.  The patient was located at home.  The provider was located at [facility].   Angeline LOISE Sayers, NP   Subjective:   Patient ID:  Laura Simmons is a 48 y.o. (DOB Feb 12, 1975) female.  Chief Complaint: No chief complaint on file.   HPI Laura Simmons presents for follow-up of MDD, GAD, ADD and insomnia.  Describes mood today as ok. Pleasant. Denies tearfulness. Mood symptoms - reports some anxiety and depression. Denies irritability. Reports feeling overwhelmed at times. Reports lower interest and motivation. Denies panic attacks. Denies worry and rumination. Reports some over thinking. Reports worrying about her father - health concerns. Reports mood is stable - fluctuates at times. Stating I feel like like I'm doing ok. Feels like current medication regimen is helpful. Taking medications as prescribed.  Energy levels lower - having to push myself. Active, has a regular exercise routine.   Enjoys some usual interests and activities. Living with parents - care giving. Spending time with family. Appetite  adequate. Weight stable - 112 pounds. Sleeps well most nights. Averages 7 to 8 hours. Reports focus and concentration difficulties - reports various factors contributing. Completing tasks. Managing aspects of household. Caregiver for parents. Planning to get a new puppy. Denies SI or HI.  Denies AH or VH. Denies self harm. Denies substance use.   Past Psychiatric Medication Trials: Zyprexa - worsening depression Trileptal - benefits did not outweigh risks Remeron  Zoloft Celexa Lexapro Trintellix - Severe n/v Wellbutrin  Nortriptyline  Ritalin  Strattera -no improvement Qelbree - Denied by insurance. Tolerated samples for 1-2 weeks.  Seroquel- RLS Rexulti - Took briefly and may have been slightly helpful. Depakote Gabapentin  Propranolol-fatigue Topamax Trazodone- excessive daytime somnolence Klonopin  Xanax  Ativan  Ambien  May have taken Lamictal   Review of Systems:  Review of Systems  Musculoskeletal:  Negative for gait problem.  Neurological:  Negative for tremors.  Psychiatric/Behavioral:         Please refer to HPI    Medications: I have reviewed the patient's current medications.  Current Outpatient Medications  Medication Sig Dispense Refill   butalbital -acetaminophen -caffeine  (FIORICET ) 50-325-40 MG tablet Take 1-2 tabs every 6 hrs as needed for headaches. 30 tablet 5   calcium  carbonate (TUMS - DOSED IN MG ELEMENTAL CALCIUM ) 500 MG chewable tablet Chew 1 tablet by mouth as needed for indigestion or heartburn.     clonazePAM  (KLONOPIN ) 1 MG tablet TAKE 1/2 TO 1 TABLET BY MOUTH TWICE DAILY AS NEEDED FOR ANXIETY 60 tablet 2   diphenhydrAMINE  (BENADRYL ) 25 MG tablet Take 25 mg by mouth every 6 (six) hours as needed.     fluticasone  (FLONASE ) 50 MCG/ACT nasal spray Place 2 sprays into both nostrils daily. 16 g 3  gabapentin  (NEURONTIN ) 100 MG capsule Take 1-3 capsules at night. 90 capsule 1   lisdexamfetamine (VYVANSE ) 30 MG capsule Take 1 capsule (30 mg total) by mouth  daily. 30 capsule 0   lisdexamfetamine (VYVANSE ) 30 MG capsule Take 1 capsule (30 mg total) by mouth daily. 30 capsule 0   lisdexamfetamine (VYVANSE ) 30 MG capsule Take 1 capsule (30 mg total) by mouth daily. 30 capsule 0   Multiple Vitamin (MULTIVITAMIN WITH MINERALS) TABS tablet Take 1 tablet by mouth daily. 30 tablet 1   zolpidem  (AMBIEN ) 5 MG tablet TAKE 1 TABLET(5 MG) BY MOUTH AT BEDTIME AS NEEDED FOR SLEEP 30 tablet 5   No current facility-administered medications for this visit.    Medication Side Effects: None  Allergies:  Allergies  Allergen Reactions   Prochlorperazine Other (See Comments)    lockjaw Other reaction(s): Other   Sumatriptan Succinate Anaphylaxis and Other (See Comments)    ALL TRIPTAN MEDS   Triptans Anaphylaxis and Other (See Comments)    Pt states throat feels as if it is closing.   Ketamine  Other (See Comments)    Pt. Reports she never wants again. major hallucinations   Reglan  [Metoclopramide ] Other (See Comments)    crawling out of my skin    Past Medical History:  Diagnosis Date   ADHD (attention deficit hyperactivity disorder)    Agoraphobia    Basal cell carcinoma    Bipolar 1 disorder (HCC)    patient denies   Chronic pain    just migraines per patient   Endometriosis determined by laparoscopy    GERD (gastroesophageal reflux disease)    High platelet count    Migraine    Personality disorder (HCC)    Right adrenal mass (HCC)    per ct 09-06-2015   Right ovarian cyst    Vaginal Pap smear, abnormal     Family History  Problem Relation Age of Onset   Migraines Mother    Anxiety disorder Mother    Diabetes Maternal Grandmother    Stroke Maternal Grandfather    Heart attack Paternal Grandfather    Anxiety disorder Paternal Aunt    Post-traumatic stress disorder Cousin        After [place]    Social History   Socioeconomic History   Marital status: Single    Spouse name: Not on file   Number of children: Not on file    Years of education: Not on file   Highest education level: Bachelor's degree (e.g., BA, AB, BS)  Occupational History   Not on file  Tobacco Use   Smoking status: Every Day    Current packs/day: 0.50    Average packs/day: 0.5 packs/day for 20.0 years (10.0 ttl pk-yrs)    Types: Cigarettes   Smokeless tobacco: Never  Substance and Sexual Activity   Alcohol use: Yes    Comment: very rare   Drug use: No   Sexual activity: Not Currently    Birth control/protection: None  Other Topics Concern   Not on file  Social History Narrative   Lives with parents in a one story home.  No children.     Currently not working.  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    Education: college   Social Drivers of Health   Financial Resource Strain: Patient Declined (05/28/2023)   Overall Financial Resource Strain (CARDIA)    Difficulty of Paying Living Expenses: Patient declined  Food Insecurity: No Food Insecurity (05/28/2023)   Hunger  Vital Sign    Worried About Programme researcher, broadcasting/film/video in the Last Year: Never true    Ran Out of Food in the Last Year: Never true  Transportation Needs: No Transportation Needs (05/28/2023)   PRAPARE - Administrator, Civil Service (Medical): No    Lack of Transportation (Non-Medical): No  Physical Activity: Sufficiently Active (05/28/2023)   Exercise Vital Sign    Days of Exercise per Week: 5 days    Minutes of Exercise per Session: 30 min  Stress: No Stress Concern Present (05/28/2023)   [employer] of Occupational Health - Occupational Stress Questionnaire    Feeling of Stress : Only a little  Social Connections: Moderately Isolated (05/28/2023)   Social Connection and Isolation Panel    Frequency of Communication with Friends and Family: More than three times a week    Frequency of Social Gatherings with Friends and Family: More than three times a week    Attends Religious Services: More than 4 times per year    Active Member of [employer]  or Organizations: No    Attends Engineer, structural: Not on file    Marital Status: Never married  Intimate Partner Violence: Not on file    Past Medical History, Surgical history, Social history, and Family history were reviewed and updated as appropriate.   Please see review of systems for further details on the patient's review from today.   Objective:   Physical Exam:  There were no vitals taken for this visit.  Physical Exam Constitutional:      General: She is not in acute distress. Musculoskeletal:        General: No deformity.  Neurological:     Mental Status: She is alert and oriented to person, place, and time.     Coordination: Coordination normal.  Psychiatric:        Attention and Perception: Attention and perception normal. She does not perceive auditory or visual hallucinations.        Mood and Affect: Mood normal. Mood is not anxious or depressed. Affect is not labile, blunt, angry or inappropriate.        Speech: Speech normal.        Behavior: Behavior normal.        Thought Content: Thought content normal. Thought content is not paranoid or delusional. Thought content does not include homicidal or suicidal ideation. Thought content does not include homicidal or suicidal plan.        Cognition and Memory: Cognition and memory normal.        Judgment: Judgment normal.     Comments: Insight intact     Lab Review:     Component Value Date/Time   NA 138 12/26/2019 1515   K 4.6 12/26/2019 1515   CL 98 12/26/2019 1515   CO2 30 12/26/2019 1515   GLUCOSE 75 12/26/2019 1515   BUN 12 12/26/2019 1515   CREATININE 0.87 12/26/2019 1515   CALCIUM  10.6 (H) 12/26/2019 1515   PROT 7.6 12/26/2019 1515   ALBUMIN 4.5 12/26/2019 1515   AST 13 12/26/2019 1515   ALT 8 12/26/2019 1515   ALKPHOS 60 12/26/2019 1515   BILITOT 0.3 12/26/2019 1515   GFRNONAA >60 08/05/2018 0616   GFRAA >60 08/05/2018 0616       Component Value Date/Time   WBC 6.5 08/06/2018  0533   RBC 4.05 08/06/2018 0533   HGB 13.0 08/06/2018 0533   HCT 37.6 08/06/2018 0533   PLT 268  08/06/2018 0533   MCV 92.8 08/06/2018 0533   MCH 32.1 08/06/2018 0533   MCHC 34.6 08/06/2018 0533   RDW 12.2 08/06/2018 0533   LYMPHSABS 2.7 08/06/2018 0533   MONOABS 0.5 08/06/2018 0533   EOSABS 0.2 08/06/2018 0533   BASOSABS 0.0 08/06/2018 0533    No results found for: POCLITH, LITHIUM   No results found for: PHENYTOIN, PHENOBARB, VALPROATE, CBMZ   .res Assessment: Plan:    Will continue current plan of care since target signs and symptoms are well controlled without any tolerability issues.  Continue: Vyvanse  30 mg daily for ADHD.  Ambien  5 mg po at bedtime prn insomnia.  Klonopin  1 mg 1/2-1 tablet twice daily as needed for anxiety.   25 minutes spent dedicated to the care of this patient on the date of this encounter to include pre-visit review of records, ordering of medication, post visit documentation, and face-to-face time with the patient discussing MDD, GAD, ADD, and insomnia. Discussed continuing current medications to help manage mood symptoms.  Pt to follow-up in 3 months or sooner if clinically indicated.   Discussed potential benefits, risk, and side effects of benzodiazepines to include potential risk of tolerance and dependence, as well as possible drowsiness. Advised patient not to drive if experiencing drowsiness and to take lowest possible effective dose to minimize risk of dependence and tolerance.   Discussed potential benefits, risks, and side effects of stimulants with patient to include increased heart rate, palpitations, insomnia, increased anxiety, increased irritability, or decreased appetite.  Instructed patient to contact office if experiencing any significant tolerability issues.   Patient advised to contact office with any questions, adverse effects, or acute worsening in signs and symptoms.  There are no diagnoses linked to this encounter.    Please see After Visit Summary for patient specific instructions.  Future Appointments  Date Time Provider Department Center  01/03/2024  9:00 AM Wendling, Mabel Mt, DO [facility]    No orders of the defined types were placed in this encounter.     -------------------------------

## 2023-08-22 ENCOUNTER — Other Ambulatory Visit: Payer: Self-pay | Admitting: Family Medicine

## 2023-09-01 ENCOUNTER — Other Ambulatory Visit: Payer: Self-pay | Admitting: Physician Assistant

## 2023-09-01 ENCOUNTER — Encounter

## 2023-09-01 ENCOUNTER — Telehealth: Admitting: Physician Assistant

## 2023-09-01 DIAGNOSIS — U071 COVID-19: Secondary | ICD-10-CM | POA: Diagnosis not present

## 2023-09-01 MED ORDER — NIRMATRELVIR/RITONAVIR (PAXLOVID)TABLET: 3.0000 | ORAL_TABLET | Freq: Two times a day (BID) | ORAL | 0 refills | Status: DC

## 2023-09-01 MED ORDER — NIRMATRELVIR/RITONAVIR (PAXLOVID)TABLET: 3.0000 | ORAL_TABLET | Freq: Two times a day (BID) | ORAL | 0 refills | Status: AC

## 2023-09-01 NOTE — Telephone Encounter (Signed)
 Copied from CRM [id number]. Topic: Clinical - Medication Refill >> Sep 01, 2023  4:04 PM Zy'onna H wrote: Medication: nirmatrelvir /ritonavir  (PAXLOVID ) 20 x 150 MG & 10 x 100MG  TABS  Has the patient contacted their pharmacy? Yes (Agent: If no, request that the patient contact the pharmacy for the refill. If patient does not wish to contact the pharmacy document the reason why and proceed with request.) (Agent: If yes, when and what did the pharmacy advise?)  This is the patient's preferred pharmacy:    [id number] [place], [place] - [address] [name] [address] Phone: [phone] Fax: [phone]  Is this the correct pharmacy for this prescription? Yes If no, delete pharmacy and type the correct one.   Has the prescription been filled recently? No  Is the patient out of the medication? Yes  Has the patient been seen for an appointment in the last year OR does the patient have an upcoming appointment? Yes  Can we respond through MyChart? Yes   **Previous pharmacy([facility]) did not have Paxlovid  in stock-  [facility] if not please inform the patient ASAP.   Agent: Please be advised that Rx refills may take up to 3 business days. We ask that you follow-up with your pharmacy.

## 2023-09-01 NOTE — Telephone Encounter (Signed)
 Copied from CRM [id number]. Topic: Clinical - Medication Refill >> Sep 01, 2023  4:15 PM Zy'onna H wrote: Medication: nirmatrelvir /ritonavir  (PAXLOVID ) 20 x 150 MG & 10 x 100MG  TABS  Has the patient contacted their pharmacy? Yes (Agent: If no, request that the patient contact the pharmacy for the refill. If patient does not wish to contact the pharmacy document the reason why and proceed with request.) (Agent: If yes, when and what did the pharmacy advise?)  This is the patient's preferred pharmacy:   [id number] [place], [place] - [address] [name] [address] Phone: [phone] Fax: [phone]    Is this the correct pharmacy for this prescription? Yes If no, delete pharmacy and type the correct one.   Has the prescription been filled recently? No  Is the patient out of the medication? Yes  Has the patient been seen for an appointment in the last year OR does the patient have an upcoming appointment? Yes  Can we respond through MyChart? Yes  [facility] previously out of Rx , [facility] Please inform patient if Rx is unavailable at this location ASAP.   Agent: Please be advised that Rx refills may take up to 3 business days. We ask that you follow-up with your pharmacy.

## 2023-09-01 NOTE — Patient Instructions (Addendum)
 Laura Simmons, thank you for joining Laura Velma Lunger, PA-C for today's virtual visit.  While this provider is not your primary care provider (PCP), if your PCP is located in our provider database this encounter information will be shared with them immediately following your visit.   A [facility] MyChart account gives you access to today's visit and all your visits, tests, and labs performed at [facility]  click here if you don't have a Laura Simmons MyChart account or go to [email or web address]  Consent: (Patient) Laura Simmons provided verbal consent for this virtual visit at the beginning of the encounter.  Current Medications:  Current Outpatient Medications:    butalbital -acetaminophen -caffeine  (FIORICET ) 50-325-40 MG tablet, Take 1-2 tabs every 6 hrs as needed for headaches., Disp: 30 tablet, Rfl: 5   calcium  carbonate (TUMS - DOSED IN MG ELEMENTAL CALCIUM ) 500 MG chewable tablet, Chew 1 tablet by mouth as needed for indigestion or heartburn., Disp: , Rfl:    clonazePAM  (KLONOPIN ) 1 MG tablet, TAKE 1/2 TO 1 TABLET BY MOUTH TWICE DAILY AS NEEDED FOR ANXIETY, Disp: 60 tablet, Rfl: 2   diphenhydrAMINE  (BENADRYL ) 25 MG tablet, Take 25 mg by mouth every 6 (six) hours as needed., Disp: , Rfl:    fluticasone  (FLONASE ) 50 MCG/ACT nasal spray, Place 2 sprays into both nostrils daily., Disp: 16 g, Rfl: 3   gabapentin  (NEURONTIN ) 100 MG capsule, TAKE 1 TO 3 CAPSULES BY MOUTH AT NIGHT, Disp: 90 capsule, Rfl: 1   lisdexamfetamine (VYVANSE ) 30 MG capsule, Take 1 capsule (30 mg total) by mouth daily., Disp: 30 capsule, Rfl: 0   [START ON 09/13/2023] lisdexamfetamine (VYVANSE ) 30 MG capsule, Take 1 capsule (30 mg total) by mouth daily., Disp: 30 capsule, Rfl: 0   [START ON 10/11/2023] lisdexamfetamine (VYVANSE ) 30 MG capsule, Take 1 capsule (30 mg total) by mouth daily., Disp: 30 capsule, Rfl: 0   Multiple Vitamin (MULTIVITAMIN WITH MINERALS) TABS tablet, Take 1 tablet by mouth  daily., Disp: 30 tablet, Rfl: 1   zolpidem  (AMBIEN ) 5 MG tablet, TAKE 1 TABLET(5 MG) BY MOUTH AT BEDTIME AS NEEDED FOR SLEEP, Disp: 30 tablet, Rfl: 2   Medications ordered in this encounter:  No orders of the defined types were placed in this encounter.    *If you need refills on other medications prior to your next appointment, please contact your pharmacy*  Follow-Up: Call back or seek an in-person evaluation if the symptoms worsen or if the condition fails to improve as anticipated.  [facility] [phone]  Care Instructions: Please keep well-hydrated and get plenty of rest. Start a saline nasal rinse to flush out your nasal passages. You can use plain Mucinex to help thin congestion. If you have a humidifier, running in the bedroom at night. I want you to start OTC vitamin D3 1000 units daily, vitamin C 1000 mg daily, and a zinc supplement. Please take prescribed medications as directed.   Isolation Instructions: You are to isolate at home until you have been fever free for at least 24 hours without a fever-reducing medication, and symptoms have been steadily improving for 24 hours. At that time,  you can end isolation but need to mask for an additional 5 days.   If you must be around other household members who do not have symptoms, you need to make sure that both you and the family members are masking consistently with a high-quality mask.  If you note any worsening of symptoms despite treatment, please seek an in-person  evaluation ASAP. If you note any significant shortness of breath or any chest pain, please seek ER evaluation. Please do not delay care!   COVID-19: What to Do if You Are Sick If you test positive and are an older adult or someone who is at high risk of getting very sick from COVID-19, treatment may be available. Contact a healthcare provider right away after a positive test to determine if you are eligible, even if your symptoms are mild right  now. You can also visit a Test to Treat location and, if eligible, receive a prescription from a provider. Don't delay: Treatment must be started within the first few days to be effective. If you have a fever, cough, or other symptoms, you might have COVID-19. Most people have mild illness and are able to recover at home. If you are sick: Keep track of your symptoms. If you have an emergency warning sign (including trouble breathing), call 911. Steps to help prevent the spread of COVID-19 if you are sick If you are sick with COVID-19 or think you might have COVID-19, follow the steps below to care for yourself and to help protect other people in your home and community. Stay home except to get medical care Stay home. Most people with COVID-19 have mild illness and can recover at home without medical care. Do not leave your home, except to get medical care. Do not visit public areas and do not go to places where you are unable to wear a mask. Take care of yourself. Get rest and stay hydrated. Take over-the-counter medicines, such as acetaminophen , to help you feel better. Stay in touch with your doctor. Call before you get medical care. Be sure to get care if you have trouble breathing, or have any other emergency warning signs, or if you think it is an emergency. Avoid public transportation, ride-sharing, or taxis if possible. Get tested If you have symptoms of COVID-19, get tested. While waiting for test results, stay away from others, including staying apart from those living in your household. Get tested as soon as possible after your symptoms start. Treatments may be available for people with COVID-19 who are at risk for becoming very sick. Don't delay: Treatment must be started early to be effective--some treatments must begin within 5 days of your first symptoms. Contact your healthcare provider right away if your test result is positive to determine if you are eligible. Self-tests are one of  several options for testing for the virus that causes COVID-19 and may be more convenient than laboratory-based tests and point-of-care tests. Ask your healthcare provider or your local health department if you need help interpreting your test results. You can visit your state, tribal, local, and territorial health department's website to look for the latest local information on testing sites. Separate yourself from other people As much as possible, stay in a specific room and away from other people and pets in your home. If possible, you should use a separate bathroom. If you need to be around other people or animals in or outside of the home, wear a well-fitting mask. Tell your close contacts that they may have been exposed to COVID-19. An infected person can spread COVID-19 starting 48 hours (or 2 days) before the person has any symptoms or tests positive. By letting your close contacts know they may have been exposed to COVID-19, you are helping to protect everyone. See COVID-19 and Animals if you have questions about pets. If you are diagnosed with COVID-19,  someone from the health department may call you. Answer the call to slow the spread. Monitor your symptoms Symptoms of COVID-19 include fever, cough, or other symptoms. Follow care instructions from your healthcare provider and local health department. Your local health authorities may give instructions on checking your symptoms and reporting information. When to seek emergency medical attention Look for emergency warning signs* for COVID-19. If someone is showing any of these signs, seek emergency medical care immediately: Trouble breathing Persistent pain or pressure in the chest New confusion Inability to wake or stay awake Pale, gray, or blue-colored skin, lips, or nail beds, depending on skin tone *This list is not all possible symptoms. Please call your medical provider for any other symptoms that are severe or concerning to  you. Call 911 or call ahead to your local emergency facility: Notify the operator that you are seeking care for someone who has or may have COVID-19. Call ahead before visiting your doctor Call ahead. Many medical visits for routine care are being postponed or done by phone or telemedicine. If you have a medical appointment that cannot be postponed, call your doctor's office, and tell them you have or may have COVID-19. This will help the office protect themselves and other patients. If you are sick, wear a well-fitting mask You should wear a mask if you must be around other people or animals, including pets (even at home). Wear a mask with the best fit, protection, and comfort for you. You don't need to wear the mask if you are alone. If you can't put on a mask (because of trouble breathing, for example), cover your coughs and sneezes in some other way. Try to stay at least 6 feet away from other people. This will help protect the people around you. Masks should not be placed on young children under age 31 years, anyone who has trouble breathing, or anyone who is not able to remove the mask without help. Cover your coughs and sneezes Cover your mouth and nose with a tissue when you cough or sneeze. Throw away used tissues in a lined trash can. Immediately wash your hands with soap and water for at least 20 seconds. If soap and water are not available, clean your hands with an alcohol-based hand sanitizer that contains at least 60% alcohol. Clean your hands often Wash your hands often with soap and water for at least 20 seconds. This is especially important after blowing your nose, coughing, or sneezing; going to the bathroom; and before eating or preparing food. Use hand sanitizer if soap and water are not available. Use an alcohol-based hand sanitizer with at least 60% alcohol, covering all surfaces of your hands and rubbing them together until they feel dry. Soap and water are the best option,  especially if hands are visibly dirty. Avoid touching your eyes, nose, and mouth with unwashed hands. Handwashing Tips Avoid sharing personal household items Do not share dishes, drinking glasses, cups, eating utensils, towels, or bedding with other people in your home. Wash these items thoroughly after using them with soap and water or put in the dishwasher. Clean surfaces in your home regularly Clean and disinfect high-touch surfaces (for example, doorknobs, tables, handles, light switches, and countertops) in your sick room and bathroom. In shared spaces, you should clean and disinfect surfaces and items after each use by the person who is ill. If you are sick and cannot clean, a caregiver or other person should only clean and disinfect the area around you (such  as your bedroom and bathroom) on an as needed basis. Your caregiver/other person should wait as long as possible (at least several hours) and wear a mask before entering, cleaning, and disinfecting shared spaces that you use. Clean and disinfect areas that may have blood, stool, or body fluids on them. Use household cleaners and disinfectants. Clean visible dirty surfaces with household cleaners containing soap or detergent. Then, use a household disinfectant. Use a product from [employer] List N: Disinfectants for Coronavirus (COVID-19). Be sure to follow the instructions on the label to ensure safe and effective use of the product. Many products recommend keeping the surface wet with a disinfectant for a certain period of time (look at contact time on the product label). You may also need to wear personal protective equipment, such as gloves, depending on the directions on the product label. Immediately after disinfecting, wash your hands with soap and water for 20 seconds. For completed guidance on cleaning and disinfecting your home, visit Complete Disinfection Guidance. Take steps to improve ventilation at home Improve ventilation  (air flow) at home to help prevent from spreading COVID-19 to other people in your household. Clear out COVID-19 virus particles in the air by opening windows, using air filters, and turning on fans in your home. Use this interactive tool to learn how to improve air flow in your home. When you can be around others after being sick with COVID-19 Deciding when you can be around others is different for different situations. Find out when you can safely end home isolation. For any additional questions about your care, contact your healthcare provider or state or local health department. 04/09/2020 Content source: [facility] for Immunization and Respiratory Diseases ([facility]), Division of Viral Diseases This information is not intended to replace advice given to you by your health care provider. Make sure you discuss any questions you have with your health care provider. Document Revised: 05/23/2020 Document Reviewed: 05/23/2020 Elsevier Patient Education  2022 [employer].     If you have been instructed to have an in-person evaluation today at a local Urgent Care facility, please use the link below. It will take you to a list of all of our available [facility], including address, phone number and hours of operation. Please do not delay care.  [facility]  If you or a family member do not have a primary care provider, use the link below to schedule a visit and establish care. When you choose a [facility] primary care physician or advanced practice provider, you gain a long-term partner in health. Find a Primary Care Provider  Learn more about [facility] in-office and virtual care options: [facility] Get Care Now

## 2023-09-01 NOTE — Progress Notes (Signed)
 Virtual Visit Consent   Laura Simmons, you are scheduled for a virtual visit with a [facility] provider today. Just as with appointments in the office, your consent must be obtained to participate. Your consent will be active for this visit and any virtual visit you may have with one of our providers in the next 365 days. If you have a MyChart account, a copy of this consent can be sent to you electronically.  As this is a virtual visit, video technology does not allow for your provider to perform a traditional examination. This may limit your provider's ability to fully assess your condition. If your provider identifies any concerns that need to be evaluated in person or the need to arrange testing (such as labs, EKG, etc.), we will make arrangements to do so. Although advances in technology are sophisticated, we cannot ensure that it will always work on either your end or our end. If the connection with a video visit is poor, the visit may have to be switched to a telephone visit. With either a video or telephone visit, we are not always able to ensure that we have a secure connection.  By engaging in this virtual visit, you consent to the provision of healthcare and authorize for your insurance to be billed (if applicable) for the services provided during this visit. Depending on your insurance coverage, you may receive a charge related to this service.  I need to obtain your verbal consent now. Are you willing to proceed with your visit today? Laura Simmons has provided verbal consent on 09/01/2023 for a virtual visit (video or telephone). Laura Simmons, [place]  Date: 09/01/2023 3:01 PM   Virtual Visit via Video Note   I, Laura Simmons, connected with  Laura Simmons  ([id number], 03-24-1975) on 09/01/23 at  2:45 PM EDT by a video-enabled telemedicine application and verified that I am speaking with the correct person using two identifiers.  Location: Patient: Virtual Visit  Location Patient: Home Provider: Virtual Visit Location Provider: Home Office   I discussed the limitations of evaluation and management by telemedicine and the availability of in person appointments. The patient expressed understanding and agreed to proceed.    History of Present Illness: [name] is a 48 y.o. who identifies as a female who was assigned female at birth, and is being seen today for COVID-19. Endorses symptoms starting today around lunch with fever climbing to 101.6, body aches and substantial headache. Some fatigue. Denies cough or congestion, nausea or vomiting. Denies chest pain or SOB. Tested at home today and was positive. Both of her elderly parents are currently dealing with COVID, so she has been their primary caregiver.  HPI: HPI  Problems:  Patient Active Problem List   Diagnosis Date Noted   Esophageal stricture 12/26/2019   Tear of right supraspinatus tendon 08/13/2018   Metabolic acidosis 08/03/2018   Seizures (HCC) 08/03/2018   Hypoglycemia 08/02/2018   Mood disorder (HCC) 01/13/2018   PTSD (post-traumatic stress disorder) 10/19/2017   Chest pain 11/25/2015   Tachycardia 11/25/2015   Elevated troponin 11/25/2015   Anxiety    Hyperlipidemia 10/18/2015   Bipolar disorder with depression (HCC) 10/18/2015   Ovarian cyst 09/19/2015    Class: Present on Admission   Endometriosis 09/19/2015    Class: Present on Admission   Chronic daily headache 09/03/2015   Medication overuse headache 09/03/2015   Chronic pain disorder 08/19/2015   Panic disorder 08/19/2015   IBS (irritable bowel syndrome) 08/19/2015  Migraine headache 01/06/2013    Allergies:  Allergies  Allergen Reactions   Prochlorperazine Other (See Comments)    lockjaw Other reaction(s): Other   Sumatriptan Succinate Anaphylaxis and Other (See Comments)    ALL TRIPTAN MEDS   Triptans Anaphylaxis and Other (See Comments)    Pt states throat feels as if it is closing.   Ketamine  Other  (See Comments)    Pt. Reports she never wants again. major hallucinations   Reglan  [Metoclopramide ] Other (See Comments)    crawling out of my skin   Medications:  Current Outpatient Medications:    nirmatrelvir /ritonavir  (PAXLOVID ) 20 x 150 MG & 10 x 100MG  TABS, Take 3 tablets by mouth 2 (two) times daily for 5 days. (Take nirmatrelvir  150 mg two tablets twice daily for 5 days and ritonavir  100 mg one tablet twice daily for 5 days) Patient GFR is 88 on last check., Disp: 30 tablet, Rfl: 0   butalbital -acetaminophen -caffeine  (FIORICET ) 50-325-40 MG tablet, Take 1-2 tabs every 6 hrs as needed for headaches., Disp: 30 tablet, Rfl: 5   calcium  carbonate (TUMS - DOSED IN MG ELEMENTAL CALCIUM ) 500 MG chewable tablet, Chew 1 tablet by mouth as needed for indigestion or heartburn., Disp: , Rfl:    clonazePAM  (KLONOPIN ) 1 MG tablet, TAKE 1/2 TO 1 TABLET BY MOUTH TWICE DAILY AS NEEDED FOR ANXIETY, Disp: 60 tablet, Rfl: 2   diphenhydrAMINE  (BENADRYL ) 25 MG tablet, Take 25 mg by mouth every 6 (six) hours as needed., Disp: , Rfl:    fluticasone  (FLONASE ) 50 MCG/ACT nasal spray, Place 2 sprays into both nostrils daily., Disp: 16 g, Rfl: 3   gabapentin  (NEURONTIN ) 100 MG capsule, TAKE 1 TO 3 CAPSULES BY MOUTH AT NIGHT, Disp: 90 capsule, Rfl: 1   lisdexamfetamine (VYVANSE ) 30 MG capsule, Take 1 capsule (30 mg total) by mouth daily., Disp: 30 capsule, Rfl: 0   [START ON 09/13/2023] lisdexamfetamine (VYVANSE ) 30 MG capsule, Take 1 capsule (30 mg total) by mouth daily., Disp: 30 capsule, Rfl: 0   [START ON 10/11/2023] lisdexamfetamine (VYVANSE ) 30 MG capsule, Take 1 capsule (30 mg total) by mouth daily., Disp: 30 capsule, Rfl: 0   Multiple Vitamin (MULTIVITAMIN WITH MINERALS) TABS tablet, Take 1 tablet by mouth daily., Disp: 30 tablet, Rfl: 1   zolpidem  (AMBIEN ) 5 MG tablet, TAKE 1 TABLET(5 MG) BY MOUTH AT BEDTIME AS NEEDED FOR SLEEP, Disp: 30 tablet, Rfl: 2  Observations/Objective: Patient is well-developed,  well-nourished in no acute distress.  Resting comfortably at home.  Head is normocephalic, atraumatic.  No labored breathing. Speech is clear and coherent with logical content.  Patient is alert and oriented at baseline.   Assessment and Plan: 1. COVID-19 (Primary) - nirmatrelvir /ritonavir  (PAXLOVID ) 20 x 150 MG & 10 x 100MG  TABS; Take 3 tablets by mouth 2 (two) times daily for 5 days. (Take nirmatrelvir  150 mg two tablets twice daily for 5 days and ritonavir  100 mg one tablet twice daily for 5 days) Patient GFR is 88 on last check.  Dispense: 30 tablet; Refill: 0  Patient with substantial generalized symptoms. Is also primary caregiver for her elderly parents. Discussed risks/benefits of antiviral medications including most common potential ADRs. Patient voiced understanding and would like to proceed with antiviral medication. They are candidate for Paxlovid . Rx sent to pharmacy. Supportive measures, OTC medications and vitamin regimen reviewed. Quarantine reviewed in detail. Strict ER precautions discussed with patient.    Follow Up Instructions: I discussed the assessment and treatment plan with the patient. The  patient was provided an opportunity to ask questions and all were answered. The patient agreed with the plan and demonstrated an understanding of the instructions.  A copy of instructions were sent to the patient via MyChart unless otherwise noted below.   The patient was advised to call back or seek an in-person evaluation if the symptoms worsen or if the condition fails to improve as anticipated.    Laura Velma Lunger, PA-C

## 2023-09-01 NOTE — Telephone Encounter (Signed)
 Copied from CRM [id number]. Topic: Clinical - Medication Refill >> Sep 01, 2023  4:15 PM Laura Simmons wrote: Medication: nirmatrelvir /ritonavir  (PAXLOVID ) 20 x 150 MG & 10 x 100MG  TABS  Has the patient contacted their pharmacy? Yes (Agent: If no, request that the patient contact the pharmacy for the refill. If patient does not wish to contact the pharmacy document the reason why and proceed with request.) (Agent: If yes, when and what did the pharmacy advise?)  This is the patient's preferred pharmacy:   [id number] [place], [place] - [address] [name] [address] Phone: [phone] Fax: [phone]    Is this the correct pharmacy for this prescription? Yes If no, delete pharmacy and type the correct one.   Has the prescription been filled recently? No  Is the patient out of the medication? Yes  Has the patient been seen for an appointment in the last year OR does the patient have an upcoming appointment? Yes  Can we respond through MyChart? Yes  [facility] previously out of Rx , resub[facility]. Please inform patient if Rx is unavailable at this location ASAP.   Agent: Please be advised that Rx refills may take up to 3 business days. We ask that you follow-up with your pharmacy.

## 2023-10-27 ENCOUNTER — Other Ambulatory Visit: Payer: Self-pay | Admitting: Family Medicine

## 2023-11-10 ENCOUNTER — Other Ambulatory Visit: Payer: Self-pay | Admitting: Adult Health

## 2023-11-10 DIAGNOSIS — F5105 Insomnia due to other mental disorder: Secondary | ICD-10-CM

## 2023-11-16 ENCOUNTER — Telehealth: Admitting: Adult Health

## 2023-11-16 ENCOUNTER — Encounter: Payer: Self-pay | Admitting: Adult Health

## 2023-11-16 DIAGNOSIS — F3342 Major depressive disorder, recurrent, in full remission: Secondary | ICD-10-CM

## 2023-11-16 DIAGNOSIS — G4700 Insomnia, unspecified: Secondary | ICD-10-CM | POA: Diagnosis not present

## 2023-11-16 DIAGNOSIS — F4310 Post-traumatic stress disorder, unspecified: Secondary | ICD-10-CM

## 2023-11-16 DIAGNOSIS — F902 Attention-deficit hyperactivity disorder, combined type: Secondary | ICD-10-CM

## 2023-11-16 DIAGNOSIS — F411 Generalized anxiety disorder: Secondary | ICD-10-CM | POA: Diagnosis not present

## 2023-11-16 DIAGNOSIS — F329 Major depressive disorder, single episode, unspecified: Secondary | ICD-10-CM | POA: Diagnosis not present

## 2023-11-16 DIAGNOSIS — F99 Mental disorder, not otherwise specified: Secondary | ICD-10-CM

## 2023-11-16 DIAGNOSIS — F909 Attention-deficit hyperactivity disorder, unspecified type: Secondary | ICD-10-CM | POA: Diagnosis not present

## 2023-11-16 MED ORDER — ZOLPIDEM TARTRATE 5 MG PO TABS: ORAL_TABLET | ORAL | 2 refills | Status: DC

## 2023-11-16 MED ORDER — LISDEXAMFETAMINE DIMESYLATE 30 MG PO CAPS: 30.0000 mg | ORAL_CAPSULE | Freq: Every day | ORAL | 0 refills | Status: DC

## 2023-11-16 MED ORDER — CLONAZEPAM 1 MG PO TABS: ORAL_TABLET | ORAL | 2 refills | Status: DC

## 2023-11-16 NOTE — Progress Notes (Signed)
 Laura Simmons [id number] 1975-12-06 48 y.o.  Virtual Visit via Video Note  I connected with pt @ on 11/16/23 at 11:30 AM EDT by a video enabled telemedicine application and verified that I am speaking with the correct person using two identifiers.   I discussed the limitations of evaluation and management by telemedicine and the availability of in person appointments. The patient expressed understanding and agreed to proceed.  I discussed the assessment and treatment plan with the patient. The patient was provided an opportunity to ask questions and all were answered. The patient agreed with the plan and demonstrated an understanding of the instructions.   The patient was advised to call back or seek an in-person evaluation if the symptoms worsen or if the condition fails to improve as anticipated.  I provided 25 minutes of non-face-to-face time during this encounter.  The patient was located at home.  The provider was located at [facility].   Angeline LOISE Sayers, NP   Subjective:   Patient ID:  Laura Simmons is a 48 y.o. (DOB 1975/11/15) female.  Chief Complaint: No chief complaint on file.   HPI Laura Simmons presents for follow-up of MDD, GAD, ADD and insomnia.  Describes mood today as ok. Pleasant. Denies tearfulness. Mood symptoms - reports some anxiety and depression - sadness. Reports irritability at times. Reports lower interest and motivation - just going through the motions. Denies panic attacks - periods of high anxiety. Reports worry and rumination. Reports some over thinking - about my parents passing away. Reports worrying about her father - health concerns. Reports mood is on the lower side. Stating I've had a lot on me. Feels like current medication regimen is helpful. Taking medications as prescribed.  Energy levels lower - I feel tired right now. Active, does not have a current exercise routine.   Enjoys some usual interests and  activities. Living with parents. Spending time with family. Appetite adequate. Weight loss - 108 from 112 pounds. Reports difficulties with sleep over the past week - concerns about parents - dog.  Reports focus and concentration difficulties - feeling overwhelmed. Managing aspects of household. Caregiver for parents. Reports stressors with adopted puppy. Denies SI or HI.  Denies AH or VH. Denies self harm. Denies substance use.   Past Psychiatric Medication Trials: Zyprexa - worsening depression Trileptal - benefits did not outweigh risks Remeron  Zoloft Celexa Lexapro Trintellix - Severe n/v Wellbutrin  Nortriptyline  Ritalin  Strattera -no improvement Qelbree - Denied by insurance. Tolerated samples for 1-2 weeks.  Seroquel- RLS Rexulti - Took briefly and may have been slightly helpful. Depakote Gabapentin  Propranolol-fatigue Topamax Trazodone- excessive daytime somnolence Klonopin  Xanax  Ativan  Ambien  May have taken Lamictal   Review of Systems:  Review of Systems  Musculoskeletal:  Negative for gait problem.  Neurological:  Negative for tremors.  Psychiatric/Behavioral:         Please refer to HPI    Medications: I have reviewed the patient's current medications.  Current Outpatient Medications  Medication Sig Dispense Refill   butalbital -acetaminophen -caffeine  (FIORICET ) 50-325-40 MG tablet Take 1-2 tabs every 6 hrs as needed for headaches. 30 tablet 5   calcium  carbonate (TUMS - DOSED IN MG ELEMENTAL CALCIUM ) 500 MG chewable tablet Chew 1 tablet by mouth as needed for indigestion or heartburn.     clonazePAM  (KLONOPIN ) 1 MG tablet TAKE 1/2 TO 1 TABLET BY MOUTH TWICE DAILY AS NEEDED FOR ANXIETY 60 tablet 2   diphenhydrAMINE  (BENADRYL ) 25 MG tablet Take 25 mg by mouth every 6 (six) hours as needed.  fluticasone  (FLONASE ) 50 MCG/ACT nasal spray Place 2 sprays into both nostrils daily. 16 g 3   gabapentin  (NEURONTIN ) 100 MG capsule Take 1-3 capsules (100-300 mg  total) by mouth at bedtime. 90 capsule 1   lisdexamfetamine (VYVANSE ) 30 MG capsule Take 1 capsule (30 mg total) by mouth daily. 30 capsule 0   lisdexamfetamine (VYVANSE ) 30 MG capsule Take 1 capsule (30 mg total) by mouth daily. 30 capsule 0   lisdexamfetamine (VYVANSE ) 30 MG capsule Take 1 capsule (30 mg total) by mouth daily. 30 capsule 0   Multiple Vitamin (MULTIVITAMIN WITH MINERALS) TABS tablet Take 1 tablet by mouth daily. 30 tablet 1   zolpidem  (AMBIEN ) 5 MG tablet TAKE 1 TABLET(5 MG) BY MOUTH AT BEDTIME AS NEEDED FOR SLEEP 30 tablet 0   No current facility-administered medications for this visit.    Medication Side Effects: None  Allergies:  Allergies  Allergen Reactions   Prochlorperazine Other (See Comments)    lockjaw Other reaction(s): Other   Sumatriptan Succinate Anaphylaxis and Other (See Comments)    ALL TRIPTAN MEDS   Triptans Anaphylaxis and Other (See Comments)    Pt states throat feels as if it is closing.   Ketamine  Other (See Comments)    Pt. Reports she never wants again. major hallucinations   Reglan  [Metoclopramide ] Other (See Comments)    crawling out of my skin    Past Medical History:  Diagnosis Date   ADHD (attention deficit hyperactivity disorder)    Agoraphobia    Basal cell carcinoma    Bipolar 1 disorder (HCC)    patient denies   Chronic pain    just migraines per patient   Endometriosis determined by laparoscopy    GERD (gastroesophageal reflux disease)    High platelet count    Migraine    Personality disorder (HCC)    Right adrenal mass    per ct 09-06-2015   Right ovarian cyst    Vaginal Pap smear, abnormal     Family History  Problem Relation Age of Onset   Migraines Mother    Anxiety disorder Mother    Diabetes Maternal Grandmother    Stroke Maternal Grandfather    Heart attack Paternal Grandfather    Anxiety disorder Paternal Aunt    Post-traumatic stress disorder Cousin        After [place]    Social History    Socioeconomic History   Marital status: Single    Spouse name: Not on file   Number of children: Not on file   Years of education: Not on file   Highest education level: Bachelor's degree (e.g., BA, AB, BS)  Occupational History   Not on file  Tobacco Use   Smoking status: Every Day    Current packs/day: 0.50    Average packs/day: 0.5 packs/day for 20.0 years (10.0 ttl pk-yrs)    Types: Cigarettes   Smokeless tobacco: Never  Substance and Sexual Activity   Alcohol use: Yes    Comment: very rare   Drug use: No   Sexual activity: Not Currently    Birth control/protection: None  Other Topics Concern   Not on file  Social History Narrative   Lives with parents in a one story home.  No children.     Currently not working.  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    Education: college   Social Drivers of Health   Financial Resource Strain: Patient Declined (05/28/2023)   Overall Physicist, Medical  Strain (CARDIA)    Difficulty of Paying Living Expenses: Patient declined  Food Insecurity: No Food Insecurity (05/28/2023)   Hunger Vital Sign    Worried About Running Out of Food in the Last Year: Never true    Ran Out of Food in the Last Year: Never true  Transportation Needs: No Transportation Needs (05/28/2023)   PRAPARE - Administrator, Civil Service (Medical): No    Lack of Transportation (Non-Medical): No  Physical Activity: Sufficiently Active (05/28/2023)   Exercise Vital Sign    Days of Exercise per Week: 5 days    Minutes of Exercise per Session: 30 min  Stress: No Stress Concern Present (05/28/2023)   [employer] of Occupational Health - Occupational Stress Questionnaire    Feeling of Stress : Only a little  Social Connections: Moderately Isolated (05/28/2023)   Social Connection and Isolation Panel    Frequency of Communication with Friends and Family: More than three times a week    Frequency of Social Gatherings with Friends and Family:  More than three times a week    Attends Religious Services: More than 4 times per year    Active Member of [employer] or Organizations: No    Attends Engineer, Structural: Not on file    Marital Status: Never married  Intimate Partner Violence: Not on file    Past Medical History, Surgical history, Social history, and Family history were reviewed and updated as appropriate.   Please see review of systems for further details on the patient's review from today.   Objective:   Physical Exam:  There were no vitals taken for this visit.  Physical Exam Constitutional:      General: She is not in acute distress. Musculoskeletal:        General: No deformity.  Neurological:     Mental Status: She is alert and oriented to person, place, and time.     Coordination: Coordination normal.  Psychiatric:        Attention and Perception: Attention and perception normal. She does not perceive auditory or visual hallucinations.        Mood and Affect: Mood normal. Mood is not anxious or depressed. Affect is not labile, blunt, angry or inappropriate.        Speech: Speech normal.        Behavior: Behavior normal.        Thought Content: Thought content normal. Thought content is not paranoid or delusional. Thought content does not include homicidal or suicidal ideation. Thought content does not include homicidal or suicidal plan.        Cognition and Memory: Cognition and memory normal.        Judgment: Judgment normal.     Comments: Insight intact     Lab Review:     Component Value Date/Time   NA 138 12/26/2019 1515   K 4.6 12/26/2019 1515   CL 98 12/26/2019 1515   CO2 30 12/26/2019 1515   GLUCOSE 75 12/26/2019 1515   BUN 12 12/26/2019 1515   CREATININE 0.87 12/26/2019 1515   CALCIUM  10.6 (H) 12/26/2019 1515   PROT 7.6 12/26/2019 1515   ALBUMIN 4.5 12/26/2019 1515   AST 13 12/26/2019 1515   ALT 8 12/26/2019 1515   ALKPHOS 60 12/26/2019 1515   BILITOT 0.3 12/26/2019 1515    GFRNONAA >60 08/05/2018 0616   GFRAA >60 08/05/2018 0616       Component Value Date/Time   WBC 6.5 08/06/2018 0533  RBC 4.05 08/06/2018 0533   HGB 13.0 08/06/2018 0533   HCT 37.6 08/06/2018 0533   PLT 268 08/06/2018 0533   MCV 92.8 08/06/2018 0533   MCH 32.1 08/06/2018 0533   MCHC 34.6 08/06/2018 0533   RDW 12.2 08/06/2018 0533   LYMPHSABS 2.7 08/06/2018 0533   MONOABS 0.5 08/06/2018 0533   EOSABS 0.2 08/06/2018 0533   BASOSABS 0.0 08/06/2018 0533    No results found for: POCLITH, LITHIUM   No results found for: PHENYTOIN, PHENOBARB, VALPROATE, CBMZ   .res Assessment: Plan:    Will continue current plan of care since target signs and symptoms are well controlled without any tolerability issues.  Continue: Vyvanse  30 mg daily for ADHD.  Ambien  5 mg po at bedtime prn insomnia.  Klonopin  1 mg 1/2-1 tablet twice daily as needed for anxiety.   25 minutes spent dedicated to the care of this patient on the date of this encounter to include pre-visit review of records, ordering of medication, post visit documentation, and face-to-face time with the patient discussing MDD, GAD, ADD, and insomnia. Discussed continuing current medications to help manage mood symptoms.  Pt to follow-up in 3 months or sooner if clinically indicated.   Discussed potential benefits, risk, and side effects of benzodiazepines to include potential risk of tolerance and dependence, as well as possible drowsiness. Advised patient not to drive if experiencing drowsiness and to take lowest possible effective dose to minimize risk of dependence and tolerance.   Discussed potential benefits, risks, and side effects of stimulants with patient to include increased heart rate, palpitations, insomnia, increased anxiety, increased irritability, or decreased appetite.  Instructed patient to contact office if experiencing any significant tolerability issues.   Patient advised to contact office with any  questions, adverse effects, or acute worsening in signs and symptoms.  There are no diagnoses linked to this encounter.   Please see After Visit Summary for patient specific instructions.  Future Appointments  Date Time Provider Department Center  11/16/2023 11:30 AM Momoka Stringfield, Angeline Mattocks, NP [facility] None  01/03/2024  9:00 AM Frann Mabel Mt, DO [id number]    No orders of the defined types were placed in this encounter.     -------------------------------

## 2023-12-26 ENCOUNTER — Other Ambulatory Visit: Payer: Self-pay | Admitting: Family Medicine

## 2024-01-03 ENCOUNTER — Ambulatory Visit: Payer: Self-pay | Admitting: Family Medicine

## 2024-01-03 ENCOUNTER — Encounter: Payer: Self-pay | Admitting: Family Medicine

## 2024-01-03 ENCOUNTER — Ambulatory Visit: Admitting: Family Medicine

## 2024-01-03 ENCOUNTER — Other Ambulatory Visit: Payer: Self-pay

## 2024-01-03 VITALS — BP 122/76 | HR 72 | Temp 98.0°F | Resp 16 | Ht 65.0 in | Wt 106.0 lb

## 2024-01-03 DIAGNOSIS — Z1211 Encounter for screening for malignant neoplasm of colon: Secondary | ICD-10-CM

## 2024-01-03 DIAGNOSIS — Z0000 Encounter for general adult medical examination without abnormal findings: Secondary | ICD-10-CM | POA: Diagnosis not present

## 2024-01-03 DIAGNOSIS — Z1231 Encounter for screening mammogram for malignant neoplasm of breast: Secondary | ICD-10-CM | POA: Diagnosis not present

## 2024-01-03 LAB — COMPREHENSIVE METABOLIC PANEL WITH GFR
ALT: 9 U/L (ref 0–35)
AST: 14 U/L (ref 0–37)
Albumin: 3.8 g/dL (ref 3.5–5.2)
Alkaline Phosphatase: 37 U/L — ABNORMAL LOW (ref 39–117)
BUN: 17 mg/dL (ref 6–23)
CO2: 32 meq/L (ref 19–32)
Calcium: 9.2 mg/dL (ref 8.4–10.5)
Chloride: 104 meq/L (ref 96–112)
Creatinine, Ser: 0.92 mg/dL (ref 0.40–1.20)
GFR: 73.26 mL/min (ref >60.00)
Glucose, Bld: 64 mg/dL — ABNORMAL LOW (ref 70–99)
Potassium: 4.8 meq/L (ref 3.5–5.1)
Sodium: 139 meq/L (ref 135–145)
Total Bilirubin: 0.2 mg/dL (ref 0.2–1.2)
Total Protein: 6.1 g/dL (ref 6.0–8.3)

## 2024-01-03 LAB — LIPID PANEL
Cholesterol: 221 mg/dL — ABNORMAL HIGH (ref 0–200)
HDL: 61 mg/dL (ref >39.00)
LDL Cholesterol: 140 mg/dL — ABNORMAL HIGH (ref 0–99)
NonHDL: 159.67
Total CHOL/HDL Ratio: 4
Triglycerides: 97 mg/dL (ref 0.0–149.0)
VLDL: 19.4 mg/dL (ref 0.0–40.0)

## 2024-01-03 LAB — CBC
HCT: 37.1 % (ref 36.0–46.0)
Hemoglobin: 12.6 g/dL (ref 12.0–15.0)
MCHC: 33.9 g/dL (ref 30.0–36.0)
MCV: 93.1 fl (ref 78.0–100.0)
Platelets: 325 K/uL (ref 150.0–400.0)
RBC: 3.98 Mil/uL (ref 3.87–5.11)
RDW: 13.5 % (ref 11.5–15.5)
WBC: 5.8 K/uL (ref 4.0–10.5)

## 2024-01-03 MED ORDER — GABAPENTIN 100 MG PO CAPS: 100.0000 mg | ORAL_CAPSULE | Freq: Every day | ORAL | Status: DC

## 2024-01-03 MED ORDER — BUTALBITAL-APAP-CAFFEINE 50-325-40 MG PO TABS: ORAL_TABLET | ORAL | 5 refills | Status: AC

## 2024-01-03 NOTE — Telephone Encounter (Signed)
 Copied from CRM #[id number]. Topic: Clinical - Prescription Issue >> Jan 03, 2024 10:53 AM Berneda FALCON wrote: Reason for CRM: Patient states that she has not received the refill request for the butalbital -acetaminophen -caffeine  (FIORICET ) 50-325-40 MG tablet and wanted to be sure the gabapentin  (NEURONTIN ) 100 MG capsule was sent to the right pharmacy. I did let her know that the gabapentin  was sent (but it does not show me which pharmacy it was sent to for some reason).  Can we please double check on the Fioricet  for her. Additionally, she would like to know how to do the Cologuard, please.

## 2024-01-03 NOTE — Telephone Encounter (Signed)
 Requesting:Fioricet   Contract: n/a UDS: n/a Last Visit: Today Next Visit: None Last Refill: 07/20/23 #30 and 5RF  Please Advise

## 2024-01-03 NOTE — Patient Instructions (Signed)
 Give [name] 2-3 business days to get the results of your labs back.   Keep the diet clean and stay active.  Please get me a copy of your advanced directive form at your convenience.   Let us know if you need anything.

## 2024-01-03 NOTE — Addendum Note (Signed)
 Addended by: Jaline Pincock M on: 01/03/2024 09:52 AM   Modules accepted: Orders

## 2024-01-03 NOTE — Progress Notes (Signed)
 Chief Complaint  Patient presents with   Annual Exam    CPE     Well Woman Laura Simmons is here for a complete physical.   Her last physical was >1 year ago.  Current diet: in general, a healthy diet. Current exercise: walking. Weight is stable and she denies fatigue out of ordinary. Seatbelt? Yes Advanced directive? No  Health Maintenance Pap/HPV- Yes Mammogram- No Tetanus- Yes Hep C screening- Yes HIV screening- Yes  Past Medical History:  Diagnosis Date   ADHD (attention deficit hyperactivity disorder)    Agoraphobia    Basal cell carcinoma    Bipolar 1 disorder (HCC)    patient denies   Chronic pain    just migraines per patient   Endometriosis determined by laparoscopy    GERD (gastroesophageal reflux disease)    High platelet count    Migraine    Personality disorder (HCC)    Right adrenal mass    per ct 09-06-2015   Right ovarian cyst    Vaginal Pap smear, abnormal      Past Surgical History:  Procedure Laterality Date   COLONOSCOPY  2012   HYSTEROSCOPY WITH D & C  10/23/2015   Procedure: DILATATION AND CURETTAGE /HYSTEROSCOPY;  Surgeon: Kelly Delon Milian, MD;  Location: [facility];  Service: Gynecology;;   INTRAUTERINE DEVICE (IUD) INSERTION N/A 10/23/2015   Procedure: INTRAUTERINE DEVICE (IUD) INSERTION with dilation of cervix with ultrasound guidance;  Surgeon: Kelly Delon Milian, MD;  Location: [facility];  Service: Gynecology;  Laterality: N/A;   LAPAROSCOPIC OVARIAN CYSTECTOMY Right 09/19/2015   Procedure: LAPAROSCOPIC OVARIAN CYSTECTOMY; CAUTERIZATION OF ENDOMETRIAL LESIONS; ENDOMETRIAL CURRETINGS;  Surgeon: Kelly Delon Milian, MD;  Location: [facility];  Service: Gynecology;  Laterality: Right;    Medications  Medications Ordered Prior to Encounter[1]   Allergies Allergies[2]  Review of Systems: Constitutional:  no unexpected weight changes Eye:  no recent significant change in vision Ear/Nose/Mouth/Throat:  Ears:  no  recent change in hearing Nose/Mouth/Throat:  no complaints of nasal congestion, no sore throat Cardiovascular: no chest pain Respiratory:  no shortness of breath Gastrointestinal:  no abdominal pain, no change in bowel habits GU:  Female: negative for dysuria or pelvic pain Musculoskeletal/Extremities:  no pain of the joints Integumentary (Skin/Breast):  no abnormal skin lesions reported Neurologic:  no headaches Endocrine:  denies fatigue Hematologic/Lymphatic:  No areas of easy bleeding  Exam BP 122/76 (BP Location: Left Arm, Patient Position: Sitting)   Pulse 72   Temp 98 F (36.7 C) (Oral)   Resp 16   Ht 5' 5 (1.651 m)   Wt 106 lb (48.1 kg)   SpO2 99%   BMI 17.64 kg/m  General:  well developed, well nourished, in no apparent distress Skin:  no significant moles, warts, or growths Head:  no masses, lesions, or tenderness Eyes:  pupils equal and round, sclera anicteric without injection Ears:  canals without lesions, TMs shiny without retraction, no obvious effusion, no erythema Nose:  nares patent, mucosa normal, and no drainage Throat/Pharynx:  lips and gingiva without lesion; tongue and uvula midline; non-inflamed pharynx; no exudates or postnasal drainage Neck: neck supple without adenopathy, thyromegaly, or masses Lungs:  clear to auscultation, breath sounds equal bilaterally, no respiratory distress Cardio:  regular rate and rhythm, no LE edema Abdomen:  abdomen soft, nontender; bowel sounds normal; no masses or organomegaly Genital: Defer to GYN Musculoskeletal:  symmetrical muscle groups noted without atrophy or deformity Extremities:  no clubbing, cyanosis, or edema,  no deformities, no skin discoloration Neuro:  gait normal; deep tendon reflexes normal and symmetric Psych: well oriented with normal range of affect and appropriate judgment/insight  Assessment and Plan  Well adult exam - Plan: CBC, Comprehensive metabolic panel with GFR, Lipid panel, Hepatitis B  surface antibody,quantitative  Encounter for screening mammogram for malignant neoplasm of breast - Plan: MM DIGITAL SCREENING BILATERAL  Screen for colon cancer   Well 48 y.o. female. Counseled on diet and exercise. Cologuard ordered. Mammogram ordered.  Other orders as above. Advanced directive form provided today.  Screen Hep B.  Follow up in 6 mo. The patient voiced understanding and agreement to the plan.  Mabel Mt [place], DO 01/03/2024 9:30 AM     [1]  Current Outpatient Medications on File Prior to Visit  Medication Sig Dispense Refill   butalbital -acetaminophen -caffeine  (FIORICET ) 50-325-40 MG tablet Take 1-2 tabs every 6 hrs as needed for headaches. 30 tablet 5   calcium  carbonate (TUMS - DOSED IN MG ELEMENTAL CALCIUM ) 500 MG chewable tablet Chew 1 tablet by mouth as needed for indigestion or heartburn.     clonazePAM  (KLONOPIN ) 1 MG tablet TAKE 1/2 TO 1 TABLET BY MOUTH TWICE DAILY AS NEEDED FOR ANXIETY 60 tablet 2   diphenhydrAMINE  (BENADRYL ) 25 MG tablet Take 25 mg by mouth every 6 (six) hours as needed.     fluticasone  (FLONASE ) 50 MCG/ACT nasal spray Place 2 sprays into both nostrils daily. 16 g 3   lisdexamfetamine  (VYVANSE ) 30 MG capsule Take 1 capsule (30 mg total) by mouth daily. 30 capsule 0   lisdexamfetamine  (VYVANSE ) 30 MG capsule Take 1 capsule (30 mg total) by mouth daily. 30 capsule 0   [START ON 01/11/2024] lisdexamfetamine  (VYVANSE ) 30 MG capsule Take 1 capsule (30 mg total) by mouth daily. 30 capsule 0   Multiple Vitamin (MULTIVITAMIN WITH MINERALS) TABS tablet Take 1 tablet by mouth daily. 30 tablet 1   zolpidem  (AMBIEN ) 5 MG tablet TAKE 1 TABLET(5 MG) BY MOUTH AT BEDTIME AS NEEDED FOR SLEEP 30 tablet 2   No current facility-administered medications on file prior to visit.  [2]  Allergies Allergen Reactions   Prochlorperazine Other (See Comments)    lockjaw Other reaction(s): Other   Sumatriptan Succinate Anaphylaxis and Other (See  Comments)    ALL TRIPTAN MEDS   Triptans Anaphylaxis and Other (See Comments)    Pt states throat feels as if it is closing.   Ketamine  Other (See Comments)    Pt. Reports she never wants again. major hallucinations   Reglan  [Metoclopramide ] Other (See Comments)    crawling out of my skin

## 2024-01-04 LAB — HEPATITIS B SURFACE ANTIBODY, QUANTITATIVE: Hep B S AB Quant (Post): 263 m[IU]/mL (ref >=10)

## 2024-01-25 LAB — COLOGUARD: COLOGUARD: POSITIVE — AB

## 2024-01-26 ENCOUNTER — Other Ambulatory Visit: Payer: Self-pay | Admitting: Family Medicine

## 2024-01-26 ENCOUNTER — Other Ambulatory Visit: Payer: Self-pay

## 2024-01-26 DIAGNOSIS — R195 Other fecal abnormalities: Secondary | ICD-10-CM

## 2024-02-16 ENCOUNTER — Telehealth: Admitting: Adult Health

## 2024-02-16 ENCOUNTER — Encounter: Payer: Self-pay | Admitting: Adult Health

## 2024-02-16 DIAGNOSIS — F3342 Major depressive disorder, recurrent, in full remission: Secondary | ICD-10-CM

## 2024-02-16 DIAGNOSIS — F989 Unspecified behavioral and emotional disorders with onset usually occurring in childhood and adolescence: Secondary | ICD-10-CM | POA: Diagnosis not present

## 2024-02-16 DIAGNOSIS — F99 Mental disorder, not otherwise specified: Secondary | ICD-10-CM

## 2024-02-16 DIAGNOSIS — F902 Attention-deficit hyperactivity disorder, combined type: Secondary | ICD-10-CM

## 2024-02-16 DIAGNOSIS — G4700 Insomnia, unspecified: Secondary | ICD-10-CM

## 2024-02-16 DIAGNOSIS — F339 Major depressive disorder, recurrent, unspecified: Secondary | ICD-10-CM | POA: Diagnosis not present

## 2024-02-16 DIAGNOSIS — F4310 Post-traumatic stress disorder, unspecified: Secondary | ICD-10-CM

## 2024-02-16 MED ORDER — ZOLPIDEM TARTRATE 5 MG PO TABS: ORAL_TABLET | ORAL | 2 refills | Status: AC

## 2024-02-16 MED ORDER — LISDEXAMFETAMINE DIMESYLATE 30 MG PO CAPS: 30.0000 mg | ORAL_CAPSULE | Freq: Every day | ORAL | 0 refills | Status: AC

## 2024-02-16 MED ORDER — CLONAZEPAM 1 MG PO TABS: ORAL_TABLET | ORAL | 2 refills | Status: AC

## 2024-02-16 NOTE — Progress Notes (Signed)
 Laura Simmons [id number] [birth year] 49 y.o.  Virtual Visit via Video Note  I connected with pt @ on 02/16/24 at 11:30 AM EST by a video enabled telemedicine application and verified that I am speaking with the correct person using two identifiers.   I discussed the limitations of evaluation and management by telemedicine and the availability of in person appointments. The patient expressed understanding and agreed to proceed.  I discussed the assessment and treatment plan with the patient. The patient was provided an opportunity to ask questions and all were answered. The patient agreed with the plan and demonstrated an understanding of the instructions.   The patient was advised to call back or seek an in-person evaluation if the symptoms worsen or if the condition fails to improve as anticipated.  I provided 25 minutes of non-face-to-face time during this encounter.  The patient was located at home.  The provider was located at [facility].   Angeline LOISE Sayers, NP   Subjective:   Patient ID:  Laura Simmons is a 49 y.o. (DOB 22-Jul-1975) female.  Chief Complaint: No chief complaint on file.   HPI Laura Simmons presents for follow-up of MDD, GAD, ADD and insomnia.  Describes mood today as ok. Pleasant. Reports some tearfulness - a lot going on here. Mood symptoms - denies depression. Reports anxiety and irritability at times. Reports lower interest and motivation - maybe a little bit better. Denies panic attacks. Reports some worry and rumination. Reports some over thinking - about my parents. Reports mood is improved. Stating I feel like things are a little better. Feels like current medication regimen is helpful. Taking medications as prescribed.  Energy levels lower. Active, does not have a current exercise routine.   Enjoys some usual interests and activities. Living with parents/dog. Spending time with family. Appetite adequate. Weight stable - 108  pounds. Reports sleeping better at night. Averages 8 hours.  Reports focus and concentration improved - better. Managing aspects of household. Caregiver for parents.  Denies SI or HI.  Denies AH or VH. Denies self harm. Denies substance use.   Past Psychiatric Medication Trials: Zyprexa - worsening depression Trileptal - benefits did not outweigh risks Remeron  Zoloft Celexa Lexapro Trintellix - Severe n/v Wellbutrin  Nortriptyline  Ritalin  Strattera -no improvement Qelbree - Denied by insurance. Tolerated samples for 1-2 weeks.  Seroquel- RLS Rexulti - Took briefly and may have been slightly helpful. Depakote Gabapentin  Propranolol-fatigue Topamax Trazodone- excessive daytime somnolence Klonopin  Xanax  Ativan  Ambien  May have taken Lamictal    Review of Systems:  Review of Systems  Musculoskeletal:  Negative for gait problem.  Neurological:  Negative for tremors.  Psychiatric/Behavioral:         Please refer to HPI    Medications: I have reviewed the patient's current medications.  Current Outpatient Medications  Medication Sig Dispense Refill   butalbital -acetaminophen -caffeine  (FIORICET ) 50-325-40 MG tablet Take 1-2 tabs every 6 hrs as needed for headaches. 30 tablet 5   calcium  carbonate (TUMS - DOSED IN MG ELEMENTAL CALCIUM ) 500 MG chewable tablet Chew 1 tablet by mouth as needed for indigestion or heartburn.     clonazePAM  (KLONOPIN ) 1 MG tablet TAKE 1/2 TO 1 TABLET BY MOUTH TWICE DAILY AS NEEDED FOR ANXIETY 60 tablet 2   diphenhydrAMINE  (BENADRYL ) 25 MG tablet Take 25 mg by mouth every 6 (six) hours as needed.     fluticasone  (FLONASE ) 50 MCG/ACT nasal spray Place 2 sprays into both nostrils daily. 16 g 3   gabapentin  (NEURONTIN ) 100 MG capsule TAKE 1 TO 3 CAPSULES(100 TO  300 MG) BY MOUTH AT BEDTIME 90 capsule 1   lisdexamfetamine  (VYVANSE ) 30 MG capsule Take 1 capsule (30 mg total) by mouth daily. 30 capsule 0   lisdexamfetamine  (VYVANSE ) 30 MG capsule Take 1  capsule (30 mg total) by mouth daily. 30 capsule 0   lisdexamfetamine  (VYVANSE ) 30 MG capsule Take 1 capsule (30 mg total) by mouth daily. 30 capsule 0   Multiple Vitamin (MULTIVITAMIN WITH MINERALS) TABS tablet Take 1 tablet by mouth daily. 30 tablet 1   zolpidem  (AMBIEN ) 5 MG tablet TAKE 1 TABLET(5 MG) BY MOUTH AT BEDTIME AS NEEDED FOR SLEEP 30 tablet 2   No current facility-administered medications for this visit.    Medication Side Effects: None  Allergies: Allergies[1]  Past Medical History:  Diagnosis Date   ADHD (attention deficit hyperactivity disorder)    Agoraphobia    Basal cell carcinoma    Bipolar 1 disorder (HCC)    patient denies   Chronic pain    just migraines per patient   Endometriosis determined by laparoscopy    GERD (gastroesophageal reflux disease)    High platelet count    Migraine    Personality disorder (HCC)    Right adrenal mass    per ct 09-06-2015   Right ovarian cyst    Vaginal Pap smear, abnormal     Family History  Problem Relation Age of Onset   Migraines Mother    Anxiety disorder Mother    Diabetes Maternal Grandmother    Stroke Maternal Grandfather    Heart attack Paternal Grandfather    Anxiety disorder Paternal Aunt    Post-traumatic stress disorder Cousin        After [place]    Social History   Socioeconomic History   Marital status: Single    Spouse name: Not on file   Number of children: Not on file   Years of education: Not on file   Highest education level: Bachelor's degree (e.g., BA, AB, BS)  Occupational History   Not on file  Tobacco Use   Smoking status: Every Day    Current packs/day: 0.50    Average packs/day: 0.5 packs/day for 20.0 years (10.0 ttl pk-yrs)    Types: Cigarettes   Smokeless tobacco: Never  Substance and Sexual Activity   Alcohol use: Yes    Comment: very rare   Drug use: No   Sexual activity: Not Currently    Birth control/protection: None  Other Topics Concern   Not on file   Social History Narrative   Lives with parents in a one story home.  No children.     Currently not working.  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    Education: college   Social Drivers of Health   Tobacco Use: High Risk (01/03/2024)   Patient History    Smoking Tobacco Use: Every Day    Smokeless Tobacco Use: Never    Passive Exposure: Not on file  Financial Resource Strain: Patient Declined (12/27/2023)   Overall Financial Resource Strain (CARDIA)    Difficulty of Paying Living Expenses: Patient declined  Food Insecurity: No Food Insecurity (12/27/2023)   Epic    Worried About Programme Researcher, Broadcasting/film/video in the Last Year: Never true    Ran Out of Food in the Last Year: Never true  Transportation Needs: No Transportation Needs (12/27/2023)   Epic    Lack of Transportation (Medical): No    Lack of Transportation (Non-Medical): No  Physical Activity: Sufficiently  Active (12/27/2023)   Exercise Vital Sign    Days of Exercise per Week: 7 days    Minutes of Exercise per Session: 30 min  Stress: Stress Concern Present (12/27/2023)   [employer] of Occupational Health - Occupational Stress Questionnaire    Feeling of Stress: To some extent  Social Connections: Moderately Isolated (12/27/2023)   Social Connection and Isolation Panel    Frequency of Communication with Friends and Family: Three times a week    Frequency of Social Gatherings with Friends and Family: Never    Attends Religious Services: More than 4 times per year    Active Member of Clubs or Organizations: No    Attends Engineer, Structural: Not on file    Marital Status: Never married  Intimate Partner Violence: Not on file  Depression (PHQ2-9): Low Risk (06/28/2023)   Depression (PHQ2-9)    PHQ-2 Score: 0  Alcohol Screen: Low Risk (12/27/2023)   Alcohol Screen    Last Alcohol Screening Score (AUDIT): 1  Housing: Low Risk (12/27/2023)   Epic    Unable to Pay for Housing in the Last Year: No     Number of Times Moved in the Last Year: 0    Homeless in the Last Year: No  Utilities: Not on file  Health Literacy: Not on file    Past Medical History, Surgical history, Social history, and Family history were reviewed and updated as appropriate.   Please see review of systems for further details on the patient's review from today.   Objective:   Physical Exam:  There were no vitals taken for this visit.  Physical Exam Constitutional:      General: She is not in acute distress. Musculoskeletal:        General: No deformity.  Neurological:     Mental Status: She is alert and oriented to person, place, and time.     Coordination: Coordination normal.  Psychiatric:        Attention and Perception: Attention and perception normal. She does not perceive auditory or visual hallucinations.        Mood and Affect: Mood normal. Mood is not anxious or depressed. Affect is not labile, blunt, angry or inappropriate.        Speech: Speech normal.        Behavior: Behavior normal.        Thought Content: Thought content normal. Thought content is not paranoid or delusional. Thought content does not include homicidal or suicidal ideation. Thought content does not include homicidal or suicidal plan.        Cognition and Memory: Cognition and memory normal.        Judgment: Judgment normal.     Comments: Insight intact     Lab Review:     Component Value Date/Time   NA 139 01/03/2024 0931   K 4.8 01/03/2024 0931   CL 104 01/03/2024 0931   CO2 32 01/03/2024 0931   GLUCOSE 64 (L) 01/03/2024 0931   BUN 17 01/03/2024 0931   CREATININE 0.92 01/03/2024 0931   CALCIUM  9.2 01/03/2024 0931   PROT 6.1 01/03/2024 0931   ALBUMIN 3.8 01/03/2024 0931   AST 14 01/03/2024 0931   ALT 9 01/03/2024 0931   ALKPHOS 37 (L) 01/03/2024 0931   BILITOT 0.2 01/03/2024 0931   GFRNONAA >60 08/05/2018 0616   GFRAA >60 08/05/2018 0616       Component Value Date/Time   WBC 5.8 01/03/2024 0931   RBC 3.98  01/03/2024 0931   HGB 12.6 01/03/2024 0931   HCT 37.1 01/03/2024 0931   PLT 325.0 01/03/2024 0931   MCV 93.1 01/03/2024 0931   MCH 32.1 08/06/2018 0533   MCHC 33.9 01/03/2024 0931   RDW 13.5 01/03/2024 0931   LYMPHSABS 2.7 08/06/2018 0533   MONOABS 0.5 08/06/2018 0533   EOSABS 0.2 08/06/2018 0533   BASOSABS 0.0 08/06/2018 0533    No results found for: POCLITH, LITHIUM   No results found for: PHENYTOIN, PHENOBARB, VALPROATE, CBMZ   .res Assessment: Plan:    Plan:  Will continue current plan of care since target signs and symptoms are well controlled without any tolerability issues.  Continue: Vyvanse  30 mg daily for ADHD.  Ambien  5 mg po at bedtime prn insomnia.  Klonopin  1 mg 1/2-1 tablet twice daily as needed for anxiety.   25 minutes spent dedicated to the care of this patient on the date of this encounter to include pre-visit review of records, ordering of medication, post visit documentation, and face-to-face time with the patient discussing MDD, GAD, ADD, and insomnia. Discussed continuing current medications to help manage mood symptoms.  Pt to follow-up in 3 months or sooner if clinically indicated.   Discussed potential benefits, risk, and side effects of benzodiazepines to include potential risk of tolerance and dependence, as well as possible drowsiness. Advised patient not to drive if experiencing drowsiness and to take lowest possible effective dose to minimize risk of dependence and tolerance.   Discussed potential benefits, risks, and side effects of stimulants with patient to include increased heart rate, palpitations, insomnia, increased anxiety, increased irritability, or decreased appetite.  Instructed patient to contact office if experiencing any significant tolerability issues.   Patient advised to contact office with any questions, adverse effects, or acute worsening in signs and symptoms.  There are no diagnoses linked to this encounter.    Please see After Visit Summary for patient specific instructions.  Future Appointments  Date Time Provider Department Center  02/16/2024 11:30 AM Latrell Potempa, Angeline Mattocks, NP [facility] None  07/03/2024 10:00 AM Frann Mabel Mt, DO [id number]    No orders of the defined types were placed in this encounter.     -------------------------------     [1]  Allergies Allergen Reactions   Prochlorperazine Other (See Comments)    lockjaw Other reaction(s): Other   Sumatriptan Succinate Anaphylaxis and Other (See Comments)    ALL TRIPTAN MEDS   Triptans Anaphylaxis and Other (See Comments)    Pt states throat feels as if it is closing.   Ketamine  Other (See Comments)    Pt. Reports she never wants again. major hallucinations   Reglan  [Metoclopramide ] Other (See Comments)    crawling out of my skin

## 2024-02-17 ENCOUNTER — Encounter: Payer: Self-pay | Admitting: Pediatrics

## 2024-03-02 ENCOUNTER — Encounter

## 2024-03-16 ENCOUNTER — Encounter: Admitting: Pediatrics

## 2024-05-17 ENCOUNTER — Telehealth: Admitting: Adult Health

## 2024-07-03 ENCOUNTER — Ambulatory Visit: Admitting: Family Medicine
